# Patient Record
Sex: Female | Born: 1965 | Race: White | Hispanic: No | Marital: Married | Smoking: Never smoker
Health system: Southern US, Community
[De-identification: ages and names within clinical notes are randomized; demographics above are authoritative.]

## PROBLEM LIST (undated history)

## (undated) DIAGNOSIS — F419 Anxiety disorder, unspecified: Secondary | ICD-10-CM

## (undated) DIAGNOSIS — G709 Myoneural disorder, unspecified: Secondary | ICD-10-CM

## (undated) DIAGNOSIS — C4491 Basal cell carcinoma of skin, unspecified: Secondary | ICD-10-CM

## (undated) DIAGNOSIS — E079 Disorder of thyroid, unspecified: Secondary | ICD-10-CM

## (undated) DIAGNOSIS — D649 Anemia, unspecified: Secondary | ICD-10-CM

## (undated) HISTORY — DX: Anxiety disorder, unspecified: F41.9

## (undated) HISTORY — DX: Anemia, unspecified: D64.9

## (undated) HISTORY — DX: Disorder of thyroid, unspecified: E07.9

## (undated) HISTORY — PX: TONSILLECTOMY AND ADENOIDECTOMY: SUR1326

## (undated) HISTORY — DX: Myoneural disorder, unspecified: G70.9

## (undated) HISTORY — PX: CHOLECYSTECTOMY: SHX55

## (undated) HISTORY — DX: Basal cell carcinoma of skin, unspecified: C44.91

---

## 1997-03-20 ENCOUNTER — Inpatient Hospital Stay (HOSPITAL_COMMUNITY): Admission: AD | Admit: 1997-03-20 | Discharge: 1997-03-22 | Payer: Self-pay | Admitting: *Deleted

## 1997-05-29 ENCOUNTER — Inpatient Hospital Stay (HOSPITAL_COMMUNITY): Admission: AD | Admit: 1997-05-29 | Discharge: 1997-05-29 | Payer: Self-pay | Admitting: Obstetrics and Gynecology

## 1997-06-05 ENCOUNTER — Inpatient Hospital Stay (HOSPITAL_COMMUNITY): Admission: AD | Admit: 1997-06-05 | Discharge: 1997-06-05 | Payer: Self-pay | Admitting: Obstetrics and Gynecology

## 1997-08-24 ENCOUNTER — Inpatient Hospital Stay (HOSPITAL_COMMUNITY): Admission: AD | Admit: 1997-08-24 | Discharge: 1997-08-24 | Payer: Self-pay | Admitting: Obstetrics and Gynecology

## 1997-08-24 ENCOUNTER — Ambulatory Visit (HOSPITAL_COMMUNITY): Admission: RE | Admit: 1997-08-24 | Discharge: 1997-08-24 | Payer: Self-pay | Admitting: Obstetrics and Gynecology

## 1997-08-30 ENCOUNTER — Inpatient Hospital Stay (HOSPITAL_COMMUNITY): Admission: AD | Admit: 1997-08-30 | Discharge: 1997-09-02 | Payer: Self-pay | Admitting: Obstetrics and Gynecology

## 1998-10-30 ENCOUNTER — Other Ambulatory Visit: Admission: RE | Admit: 1998-10-30 | Discharge: 1998-10-30 | Payer: Self-pay | Admitting: Obstetrics and Gynecology

## 2000-07-16 ENCOUNTER — Other Ambulatory Visit: Admission: RE | Admit: 2000-07-16 | Discharge: 2000-07-16 | Payer: Self-pay | Admitting: Obstetrics and Gynecology

## 2001-01-26 ENCOUNTER — Encounter: Admission: RE | Admit: 2001-01-26 | Discharge: 2001-01-26 | Payer: Self-pay | Admitting: Obstetrics and Gynecology

## 2001-01-26 ENCOUNTER — Encounter: Payer: Self-pay | Admitting: Obstetrics and Gynecology

## 2001-07-22 ENCOUNTER — Other Ambulatory Visit: Admission: RE | Admit: 2001-07-22 | Discharge: 2001-07-22 | Payer: Self-pay | Admitting: Obstetrics and Gynecology

## 2001-12-04 ENCOUNTER — Encounter: Payer: Self-pay | Admitting: Neurology

## 2001-12-04 ENCOUNTER — Ambulatory Visit (HOSPITAL_COMMUNITY): Admission: RE | Admit: 2001-12-04 | Discharge: 2001-12-04 | Payer: Self-pay | Admitting: Neurology

## 2002-05-25 ENCOUNTER — Inpatient Hospital Stay (HOSPITAL_COMMUNITY): Admission: EM | Admit: 2002-05-25 | Discharge: 2002-05-28 | Payer: Self-pay | Admitting: Emergency Medicine

## 2002-05-25 ENCOUNTER — Encounter: Payer: Self-pay | Admitting: Internal Medicine

## 2002-08-02 ENCOUNTER — Encounter: Admission: RE | Admit: 2002-08-02 | Discharge: 2002-08-02 | Payer: Self-pay

## 2003-04-23 ENCOUNTER — Other Ambulatory Visit: Admission: RE | Admit: 2003-04-23 | Discharge: 2003-04-23 | Payer: Self-pay | Admitting: Obstetrics and Gynecology

## 2003-07-17 ENCOUNTER — Encounter (HOSPITAL_COMMUNITY): Admission: RE | Admit: 2003-07-17 | Discharge: 2003-10-15 | Payer: Self-pay

## 2004-03-14 ENCOUNTER — Ambulatory Visit (HOSPITAL_COMMUNITY): Admission: RE | Admit: 2004-03-14 | Discharge: 2004-03-14 | Payer: Self-pay | Admitting: Neurology

## 2004-12-11 ENCOUNTER — Ambulatory Visit (HOSPITAL_COMMUNITY): Admission: RE | Admit: 2004-12-11 | Discharge: 2004-12-11 | Payer: Self-pay | Admitting: General Surgery

## 2004-12-11 ENCOUNTER — Ambulatory Visit (HOSPITAL_BASED_OUTPATIENT_CLINIC_OR_DEPARTMENT_OTHER): Admission: RE | Admit: 2004-12-11 | Discharge: 2004-12-11 | Payer: Self-pay | Admitting: General Surgery

## 2004-12-11 ENCOUNTER — Encounter (INDEPENDENT_AMBULATORY_CARE_PROVIDER_SITE_OTHER): Payer: Self-pay | Admitting: Specialist

## 2005-01-19 ENCOUNTER — Encounter: Admission: RE | Admit: 2005-01-19 | Discharge: 2005-01-19 | Payer: Self-pay | Admitting: Unknown Physician Specialty

## 2005-06-08 ENCOUNTER — Other Ambulatory Visit: Admission: RE | Admit: 2005-06-08 | Discharge: 2005-06-08 | Payer: Self-pay | Admitting: Obstetrics and Gynecology

## 2005-10-14 ENCOUNTER — Ambulatory Visit (HOSPITAL_COMMUNITY): Admission: RE | Admit: 2005-10-14 | Discharge: 2005-10-14 | Payer: Self-pay

## 2008-07-02 ENCOUNTER — Encounter: Admission: RE | Admit: 2008-07-02 | Discharge: 2008-07-02 | Payer: Self-pay | Admitting: Obstetrics and Gynecology

## 2009-11-14 ENCOUNTER — Encounter: Admission: RE | Admit: 2009-11-14 | Discharge: 2009-11-14 | Payer: Self-pay | Admitting: Neurology

## 2010-03-10 ENCOUNTER — Encounter: Payer: Self-pay | Admitting: Obstetrics and Gynecology

## 2010-04-25 ENCOUNTER — Telehealth (INDEPENDENT_AMBULATORY_CARE_PROVIDER_SITE_OTHER): Payer: Self-pay | Admitting: *Deleted

## 2010-04-29 NOTE — Progress Notes (Signed)
Summary: DR Posey Rea OK'D THIS PT AS A NEW PT W/HIM  ---- Converted from flag ---- ---- 04/24/2010 10:00 PM, Tresa Garter MD wrote: ok Thank you!   ---- 04/24/2010 12:17 PM, Ivar Bury wrote: Dr Marny Lowenstein says you ok'd her as a new pt w/you.---Thank you for your reply. ------------------------------ Gave pt appt-phone:  05/21/10 @ 2P---

## 2010-05-20 ENCOUNTER — Telehealth: Payer: Self-pay | Admitting: Internal Medicine

## 2010-05-20 NOTE — Telephone Encounter (Signed)
Forwarded to Dr. Plotnikov for review. °

## 2010-05-21 ENCOUNTER — Encounter: Payer: Self-pay | Admitting: Internal Medicine

## 2010-05-21 ENCOUNTER — Ambulatory Visit (INDEPENDENT_AMBULATORY_CARE_PROVIDER_SITE_OTHER): Payer: BC Managed Care – PPO | Admitting: Internal Medicine

## 2010-05-21 DIAGNOSIS — C4491 Basal cell carcinoma of skin, unspecified: Secondary | ICD-10-CM

## 2010-05-21 DIAGNOSIS — R251 Tremor, unspecified: Secondary | ICD-10-CM | POA: Insufficient documentation

## 2010-05-21 DIAGNOSIS — R5383 Other fatigue: Secondary | ICD-10-CM

## 2010-05-21 DIAGNOSIS — R259 Unspecified abnormal involuntary movements: Secondary | ICD-10-CM

## 2010-05-21 DIAGNOSIS — R7689 Other specified abnormal immunological findings in serum: Secondary | ICD-10-CM

## 2010-05-21 DIAGNOSIS — G2582 Stiff-man syndrome: Secondary | ICD-10-CM

## 2010-05-21 DIAGNOSIS — R768 Other specified abnormal immunological findings in serum: Secondary | ICD-10-CM | POA: Insufficient documentation

## 2010-05-21 DIAGNOSIS — R894 Abnormal immunological findings in specimens from other organs, systems and tissues: Secondary | ICD-10-CM

## 2010-05-21 DIAGNOSIS — M2560 Stiffness of unspecified joint, not elsewhere classified: Secondary | ICD-10-CM

## 2010-05-21 DIAGNOSIS — R609 Edema, unspecified: Secondary | ICD-10-CM

## 2010-05-21 DIAGNOSIS — R5381 Other malaise: Secondary | ICD-10-CM

## 2010-05-21 DIAGNOSIS — E063 Autoimmune thyroiditis: Secondary | ICD-10-CM

## 2010-05-21 NOTE — Progress Notes (Signed)
Subjective:    Patient ID: Lisa Bruce, female    DOB: Jun 04, 1965, 45 y.o.   MRN: [id number]  HPI  C/o sickness started in 1 -2 days 7 years ago in 2004 when her legs locked up while walking. It was in the beginning of Summer 2004. She had a difficultt pregnancy and a cholecystectomy that year. The symptoms have escalated over a few months. Eventually she went to see her FP and then Dr Sandria Manly and Dr Phylliss Bob. 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 She had a muscle bx at [facility]. She had an EMG, LP, MRI brain/spine. She did much better on Imuran 2008- 2011, then it was stopped by Dr Sandria Manly due to malignancy potential concern of his  Review of Systems  Constitutional: Positive for activity change and fatigue. Negative for fever, chills, diaphoresis, appetite change and unexpected weight change.  HENT: Positive for facial swelling, neck pain, neck stiffness and voice change. Negative for hearing loss, ear pain, nosebleeds, congestion, sore throat, rhinorrhea, sneezing, drooling, mouth sores, trouble swallowing, dental problem, postnasal drip, sinus pressure, tinnitus and ear discharge.   Eyes: Positive for photophobia. Negative for pain, discharge, redness, itching and visual disturbance.  Respiratory: Negative for apnea, cough, choking, chest tightness, shortness of breath, wheezing and stridor.   Cardiovascular: Negative for chest pain, palpitations and leg swelling.  Gastrointestinal: Negative for nausea, vomiting, abdominal pain, diarrhea, constipation, blood in stool, abdominal distention, anal bleeding and rectal pain.  Genitourinary: Positive for flank pain. Negative for dysuria, urgency, frequency, hematuria, decreased urine volume, vaginal discharge, difficulty urinating, genital sores, vaginal pain, menstrual problem and pelvic pain.  Musculoskeletal: Positive for myalgias, back pain, arthralgias and gait problem. Negative for joint swelling.    Skin: Negative for color change, pallor, rash and wound.  Neurological: Positive for tremors, facial asymmetry, speech difficulty, weakness, light-headedness and numbness. Negative for dizziness, seizures, syncope and headaches.  Hematological: Negative for adenopathy. Does not bruise/bleed easily.  Psychiatric/Behavioral: Positive for behavioral problems and dysphoric mood. Negative for suicidal ideas, hallucinations, confusion, sleep disturbance, self-injury, decreased concentration and agitation. The patient is nervous/anxious and is hyperactive.        Objective:   Physical Exam  Constitutional: She is oriented to person, place, and time. She appears well-developed and well-nourished. No distress.  HENT:  Head: Normocephalic.  Right Ear: External ear normal.  Left Ear: External ear normal.  Nose: Nose normal.  Mouth/Throat: Oropharynx is clear and moist. No oropharyngeal exudate.  Eyes: Pupils are equal, round, and reactive to light. Right eye exhibits no discharge. Left eye exhibits no discharge. No scleral icterus.  Neck: No JVD present. No tracheal deviation present. No thyromegaly present.  Cardiovascular: Normal rate and normal heart sounds.  Exam reveals no gallop.   No murmur heard. Pulmonary/Chest: No respiratory distress. She has no wheezes. She has no rales. She exhibits tenderness.  Abdominal: She exhibits no distension and no mass. There is tenderness. There is guarding. There is no rebound.  Musculoskeletal: She exhibits tenderness. She exhibits no edema.       Right shoulder: She exhibits decreased range of motion, tenderness, pain and spasm. She exhibits no effusion and no deformity.  Lymphadenopathy:    She has no cervical adenopathy.  Neurological: She is alert and oriented to person, place, and time. She is not disoriented. She displays tremor and abnormal reflex (decreased). She displays no atrophy. A cranial nerve deficit is present. She exhibits abnormal  muscle  tone (very increased). She displays no seizure activity. Coordination and gait abnormal.       Spastic all over Mild facial droop on R  Skin: No rash noted. She is not diaphoretic (Cold skin). No cyanosis or erythema. No pallor.   L face with less amotion       Assessment & Plan:  ANA positive Unknown significance  Basal cell carcinoma of skin Recurrent. S/p multiple removals  Edema Not much swelling at present  Hashimoto thyroiditis On meds. Check labs  Tremor, involuntary spasm, or fasciculation Secondary to stiff person syndr. 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 I would restart Imuran if labs are OK - the pt is very willing. It is a very complex case.

## 2010-05-21 NOTE — Patient Instructions (Signed)
Call me with the Imuran dose Come to Lab this or next week in am

## 2010-05-22 ENCOUNTER — Other Ambulatory Visit (INDEPENDENT_AMBULATORY_CARE_PROVIDER_SITE_OTHER): Payer: BC Managed Care – PPO

## 2010-05-22 ENCOUNTER — Telehealth: Payer: Self-pay | Admitting: Internal Medicine

## 2010-05-22 ENCOUNTER — Other Ambulatory Visit: Payer: Self-pay | Admitting: Internal Medicine

## 2010-05-22 ENCOUNTER — Other Ambulatory Visit (INDEPENDENT_AMBULATORY_CARE_PROVIDER_SITE_OTHER): Payer: BC Managed Care – PPO | Admitting: Internal Medicine

## 2010-05-22 DIAGNOSIS — R5381 Other malaise: Secondary | ICD-10-CM

## 2010-05-22 DIAGNOSIS — R5383 Other fatigue: Secondary | ICD-10-CM

## 2010-05-22 DIAGNOSIS — G2582 Stiff-man syndrome: Secondary | ICD-10-CM

## 2010-05-22 DIAGNOSIS — R768 Other specified abnormal immunological findings in serum: Secondary | ICD-10-CM

## 2010-05-22 DIAGNOSIS — E063 Autoimmune thyroiditis: Secondary | ICD-10-CM

## 2010-05-22 DIAGNOSIS — E785 Hyperlipidemia, unspecified: Secondary | ICD-10-CM

## 2010-05-22 LAB — CBC WITH DIFFERENTIAL/PLATELET
Basophils Relative: 0.8 % (ref 0.0–3.0)
HCT: 38.8 % (ref 36.0–46.0)
Hemoglobin: 13.5 g/dL (ref 12.0–15.0)
Lymphocytes Relative: 36.8 % (ref 12.0–46.0)
MCHC: 34.8 g/dL (ref 30.0–36.0)
MCV: 92.1 fl (ref 78.0–100.0)
Neutro Abs: 2.3 10*3/uL (ref 1.4–7.7)
Platelets: 274 10*3/uL (ref 150.0–400.0)
WBC: 4.4 10*3/uL — ABNORMAL LOW (ref 4.5–10.5)

## 2010-05-22 LAB — CK: Total CK: 85 U/L (ref 7–177)

## 2010-05-22 LAB — T4, FREE: Free T4: 0.59 ng/dL — ABNORMAL LOW (ref 0.60–1.60)

## 2010-05-22 LAB — LIPID PANEL
Cholesterol: 269 mg/dL — ABNORMAL HIGH (ref 0–200)
HDL: 63.7 mg/dL (ref >39.00)
Triglycerides: 129 mg/dL (ref 0.0–149.0)
VLDL: 25.8 mg/dL (ref 0.0–40.0)

## 2010-05-22 LAB — URIC ACID: Uric Acid, Serum: 5.6 mg/dL (ref 2.4–7.0)

## 2010-05-22 LAB — VITAMIN B12: Vitamin B-12: 246 pg/mL (ref 211–911)

## 2010-05-22 LAB — TSH: TSH: 0.02 u[IU]/mL — ABNORMAL LOW (ref 0.35–5.50)

## 2010-05-22 LAB — COMPREHENSIVE METABOLIC PANEL
Albumin: 4.1 g/dL (ref 3.5–5.2)
BUN: 11 mg/dL (ref 6–23)
Glucose, Bld: 78 mg/dL (ref 70–99)
Potassium: 4.5 mEq/L (ref 3.5–5.1)

## 2010-05-22 LAB — HEPATITIS B SURFACE ANTIBODY,QUALITATIVE: Hep B S Ab: NEGATIVE

## 2010-05-22 LAB — SEDIMENTATION RATE: Sed Rate: 13 mm/hr (ref 0–22)

## 2010-05-22 LAB — IBC PANEL: Saturation Ratios: 27.3 % (ref 20.0–50.0)

## 2010-05-22 NOTE — Telephone Encounter (Signed)
Noted. Some labs will take a while before they come back.  Agree w/handicapped placard Thank you!

## 2010-05-22 NOTE — Telephone Encounter (Signed)
Pt came in to relay information requested yesterday in reference to : Azathioprine [Imuran] 50mg  TAB ROX Sig: Take 2 Tablets PO QAM & 1 Tablet QHs. Pt had labs requested done and would like to have Results relayed via telephone to either Pt Mobile @ [phone] OR Pt's Husband Mobile @[phone]. Please leave message on mobile voicemail if necessary as Pt will be out of town next week. Pt received Handicap placard requested thru [employer] while at office today. Thanks.

## 2010-05-23 LAB — GLIADIN ANTIBODIES, SERUM: Gliadin IgA: 5.5 U/mL (ref <20)

## 2010-05-23 LAB — TISSUE TRANSGLUTAMINASE, IGA: Tissue Transglutaminase Ab, IgA: 6.2 U/mL (ref <20)

## 2010-05-23 LAB — VITAMIN D 25 HYDROXY (VIT D DEFICIENCY, FRACTURES): Vit D, 25-Hydroxy: 31 ng/mL (ref 30–89)

## 2010-05-23 LAB — ANA: Anti Nuclear Antibody(ANA): NEGATIVE

## 2010-05-23 LAB — IGG, IGA, IGM
IgG (Immunoglobin G), Serum: 1450 mg/dL (ref 694–1618)
IgM, Serum: 152 mg/dL (ref 60–263)

## 2010-05-25 ENCOUNTER — Telehealth: Payer: Self-pay | Admitting: Internal Medicine

## 2010-05-25 MED ORDER — VITAMIN B-12 1000 MCG PO TABS: 1000.0000 ug | ORAL_TABLET | Freq: Every day | ORAL | Status: DC

## 2010-05-25 MED ORDER — VITAMIN D3 1.25 MG (50000 UT) PO CAPS: 1.0000 | ORAL_CAPSULE | ORAL | Status: DC

## 2010-05-25 NOTE — Telephone Encounter (Signed)
Lisa Bruce , please, inform the patient:  the labs show low vit B12 and vit D. Start Rx Vit D and Vit B12.  Please, keep  next office visit appointment.   Thank you !

## 2010-05-26 LAB — RETICULIN ANTIBODIES, IGA W TITER: Reticulin Ab, IgA: NEGATIVE

## 2010-05-27 DIAGNOSIS — C4491 Basal cell carcinoma of skin, unspecified: Secondary | ICD-10-CM | POA: Insufficient documentation

## 2010-05-27 NOTE — Telephone Encounter (Signed)
Left mess for patient to call back.  

## 2010-05-27 NOTE — Assessment & Plan Note (Signed)
Multifactorial 

## 2010-05-27 NOTE — Assessment & Plan Note (Signed)
On meds. Check labs

## 2010-05-27 NOTE — Assessment & Plan Note (Signed)
Recurrent. S/p multiple removals

## 2010-05-27 NOTE — Assessment & Plan Note (Signed)
Multiple documents since 2004 were reviewed. I would restart Imuran if labs are OK - the pt is very willing. It is a very complex case.

## 2010-05-27 NOTE — Assessment & Plan Note (Signed)
Not much swelling at present

## 2010-05-27 NOTE — Assessment & Plan Note (Signed)
Unknown significance

## 2010-05-27 NOTE — Assessment & Plan Note (Signed)
Secondary to stiff person syndr. Cont meds

## 2010-05-29 ENCOUNTER — Encounter: Payer: Self-pay | Admitting: Internal Medicine

## 2010-05-29 NOTE — Telephone Encounter (Signed)
Left mess to call office back.   

## 2010-06-03 ENCOUNTER — Telehealth: Payer: Self-pay | Admitting: Internal Medicine

## 2010-06-03 NOTE — Telephone Encounter (Signed)
Forwarded to Dr. Plotnikov for review. °

## 2010-06-04 ENCOUNTER — Encounter: Payer: Self-pay | Admitting: Internal Medicine

## 2010-06-04 ENCOUNTER — Ambulatory Visit (INDEPENDENT_AMBULATORY_CARE_PROVIDER_SITE_OTHER): Payer: BC Managed Care – PPO | Admitting: Internal Medicine

## 2010-06-04 DIAGNOSIS — E063 Autoimmune thyroiditis: Secondary | ICD-10-CM

## 2010-06-04 DIAGNOSIS — E538 Deficiency of other specified B group vitamins: Secondary | ICD-10-CM

## 2010-06-04 DIAGNOSIS — R5383 Other fatigue: Secondary | ICD-10-CM

## 2010-06-04 DIAGNOSIS — R259 Unspecified abnormal involuntary movements: Secondary | ICD-10-CM

## 2010-06-04 DIAGNOSIS — E559 Vitamin D deficiency, unspecified: Secondary | ICD-10-CM

## 2010-06-04 DIAGNOSIS — G2582 Stiff-man syndrome: Secondary | ICD-10-CM

## 2010-06-04 DIAGNOSIS — R5381 Other malaise: Secondary | ICD-10-CM

## 2010-06-04 MED ORDER — CYANOCOBALAMIN 1000 MCG/ML IJ SOLN
1000.0000 ug | Freq: Once | INTRAMUSCULAR | Status: AC
  Administered 2010-06-04: 1000 ug via INTRAMUSCULAR

## 2010-06-04 MED ORDER — MEPERIDINE HCL 50 MG PO TABS: 100.0000 mg | ORAL_TABLET | Freq: Every day | ORAL | Status: DC

## 2010-06-04 NOTE — Progress Notes (Signed)
  Subjective:    Patient ID: Lisa Bruce, female    DOB: [birth year], 45 y.o.   MRN: [id number]  HPI   The patient is here to follow up on labs with vit D and B12 deficiency, chronic stiffness and worsening myalgia symptoms not controlled with other  Medicines, except for Demerol. Ho relief with diet and exercise.   Review of Systems  Constitutional: Negative for chills.  HENT: Negative for mouth sores, trouble swallowing and voice change.   Eyes: Negative for pain.  Respiratory: Negative for wheezing.   Gastrointestinal: Positive for constipation.  Genitourinary: Negative for menstrual problem and pelvic pain.  Musculoskeletal: Positive for myalgias, arthralgias and gait problem.  Neurological: Positive for speech difficulty and weakness. Negative for tremors.  Hematological: Negative for adenopathy.  Psychiatric/Behavioral: Negative for suicidal ideas, hallucinations, behavioral problems and decreased concentration. The patient is nervous/anxious. The patient is not hyperactive.        Objective:   Physical Exam  Constitutional: She is oriented to person, place, and time. She appears well-developed and well-nourished. No distress.  HENT:  Head: Normocephalic.  Right Ear: External ear normal.  Left Ear: External ear normal.  Nose: Nose normal.  Mouth/Throat: Oropharynx is clear and moist.  Eyes: Conjunctivae are normal. Pupils are equal, round, and reactive to light. Right eye exhibits no discharge. Left eye exhibits no discharge.  Neck: Normal range of motion. Neck supple. No JVD present. No tracheal deviation present. No thyromegaly present.  Cardiovascular: Normal rate, regular rhythm and normal heart sounds.   Pulmonary/Chest: No stridor. No respiratory distress. She has no wheezes.  Abdominal: Soft. Bowel sounds are normal. She exhibits no distension and no mass. There is no tenderness. There is no rebound and no guarding.  Musculoskeletal: She exhibits tenderness. She  exhibits no edema.       contraced flexors UE>LE  Lymphadenopathy:    She has no cervical adenopathy.  Neurological: She is alert and oriented to person, place, and time. She displays abnormal reflex. No cranial nerve deficit. She exhibits abnormal muscle tone. Coordination abnormal.  Skin: No rash noted. No erythema.  Psychiatric: She has a normal mood and affect. Her behavior is normal. Judgment and thought content normal.          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 We discussed the case with Dr Sandria Manly. We can try Imuran at low dose with monthly labs.  Potential benefits of a long term Imuran  use as well as potential risks, including the risk of cancer (ie lymphoma) and complications were explained to the patient and were aknowledged. She would like to proceed. See meds. I suggested that she applies for disability    Hashimoto thyroiditis On Rx  Vitamin D deficiency Start on Rx  Vitamin B12 deficiency Start on Rx  Tremor, involuntary spasm, or fasciculation On symptomatic Rx

## 2010-06-06 ENCOUNTER — Telehealth: Payer: Self-pay | Admitting: *Deleted

## 2010-06-06 NOTE — Telephone Encounter (Signed)
Pt and pharm left vm - Pt thinks MD was going to send in Vit B12 and Vit D rx. Please advise.

## 2010-06-07 NOTE — Telephone Encounter (Signed)
They were sent to pharmacy - pls, see meds

## 2010-06-08 NOTE — Assessment & Plan Note (Signed)
Start on Rx 

## 2010-06-08 NOTE — Assessment & Plan Note (Signed)
On Rx 

## 2010-06-08 NOTE — Assessment & Plan Note (Addendum)
A very complex situation. We discussed the case with Dr Sandria Manly. We can try Imuran at low dose with monthly labs.  Potential benefits of a long term Imuran  use as well as potential risks, including the risk of cancer (ie lymphoma) and complications were explained to the patient and were aknowledged. She would like to proceed. See meds.

## 2010-06-08 NOTE — Assessment & Plan Note (Signed)
On symptomatic Rx

## 2010-06-09 ENCOUNTER — Telehealth: Payer: Self-pay | Admitting: *Deleted

## 2010-06-09 ENCOUNTER — Other Ambulatory Visit: Payer: Self-pay | Admitting: *Deleted

## 2010-06-09 DIAGNOSIS — G2582 Stiff-man syndrome: Secondary | ICD-10-CM

## 2010-06-09 MED ORDER — AZATHIOPRINE 50 MG PO TABS: 50.0000 mg | ORAL_TABLET | Freq: Every day | ORAL | Status: DC

## 2010-06-09 MED ORDER — VITAMIN D3 1.25 MG (50000 UT) PO CAPS: 1.0000 | ORAL_CAPSULE | ORAL | Status: DC

## 2010-06-09 MED ORDER — VITAMIN B-12 1000 MCG PO TABS: 1000.0000 ug | ORAL_TABLET | Freq: Every day | ORAL | Status: DC

## 2010-06-09 NOTE — Telephone Encounter (Signed)
Pt called stating you are suppose to speak to Dr. Sandria Manly about starting her back on Imuran. Is she suppose to start taking this yet? Please advise

## 2010-06-09 NOTE — Telephone Encounter (Signed)
Called pt. Ok to start Imuran 50 mg 1 a day. RTC 1 mo w/CMET and CBC 995.20

## 2010-06-15 MED ORDER — AZATHIOPRINE 50 MG PO TABS: 50.0000 mg | ORAL_TABLET | Freq: Every day | ORAL | Status: DC

## 2010-07-04 NOTE — Op Note (Signed)
NAMESYEDA, PRICKETT NO.:  [id number]   MEDICAL RECORD NO.:  [id number]          PATIENT TYPE:  OIB   LOCATION:  2858                         FACILITY:  [facility]   PHYSICIAN:  Genene Churn. Love, M.D.    DATE OF BIRTH:  Apr 10, 1965   DATE OF PROCEDURE:  03/14/2004  DATE OF DISCHARGE:                                 OPERATIVE REPORT   PROCEDURE NOTE:  The patient was prepped and draped in the left lateral  decubitus position.  Using Betadine and 1% Xylocaine, the L3-4 interspace  was entered without difficulty.  Opening pressure was 140 mmH2O.  Clear,  colorless CSF was obtained and sent for protein, glucose, VDRL, cell count  and differential, angiotensin-converting enzyme, IgG, oligoclonal IgG, anti-  DNA and antineuronal antibody.  The patient tolerated the procedure well.      JML/MEDQ  D:  03/14/2004  T:  03/14/2004  Job:  [id number]   cc:   Areatha Keas, M.D.  [address]  [address]  [place]  [place] [address]  Fax: [phone]

## 2010-07-04 NOTE — Op Note (Signed)
Lisa Bruce, Lisa Bruce              ACCOUNT NO.:  [id number]   MEDICAL RECORD NO.:  [id number]          PATIENT TYPE:  AMB   LOCATION:  DSC                          FACILITY:  [facility]   PHYSICIAN:  Angelia Mould. Derrell Lolling, M.D.DATE OF BIRTH:  Mar 07, 1965   DATE OF PROCEDURE:  12/11/2004  DATE OF DISCHARGE:                                 OPERATIVE REPORT   PREOPERATIVE DIAGNOSIS:  Myositis.   POSTOPERATIVE DIAGNOSIS:  Myositis.   OPERATION PERFORMED:  Left thigh and quadriceps muscle biopsy x3.   SURGEON:  Angelia Mould. Derrell Lolling, M.D.   OPERATIVE INDICATIONS:  A 45 year old white female who has had progressive  problems with muscle weakness of uncertain etiology. She has had extensive  workup here and at [facility]. Her rheumatologist, Dr. Chase Picket,  feels that a muscle biopsy is the next step in her diagnostic evaluation.  She has been counseled regarding this as an outpatient.   OPERATIVE TECHNIQUE:  The patient was placed supine on the operating table,  and it appeared that she was monitored and sedated by the anesthesia  department. The left thigh was prepped and draped in a sterile fashion.  Xylocaine 1% with epinephrine was used as a local infiltration anesthetic. A  linear incision was made in the proximal left thigh, slightly laterally and  slightly obliquely in the direction of the muscle fibers. Dissection was  carried down to the deep muscle fascia, which was incised in the direction  of its fibers. Self-retaining retractors were placed. I dissected out three  distinct bundles of left thigh quadriceps muscle. Each bundle was about 0.5  cm in diameter and about 2.5 cm in length. These were clamped proximally and  distally and amputated, and then affixed to a tongue blade with metal  staples, wrapped in moistened saline and sent to pathology. I did this 3  separate times. The cut ends of the muscle bundles were ligated with 3-0  Vicryl ties. The wound was irrigated with saline.  Hemostasis was excellent.  Subcutaneous tissue was closed with interrupted sutures of 3-0 Vicryl and  the skin closed with running subcuticular suture of 3-0 Monocryl and Steri-  Strips. Clean bandages were placed. The patient was taken to the recovery  room in stable condition. Estimated blood loss was about 10 cc.  Complications were none.  Sponge and instrument counts were correct.      Angelia Mould. Derrell Lolling, M.D.  Electronically Signed     HMI/MEDQ  D:  12/11/2004  T:  12/11/2004  Job:  [id number]   cc:   Areatha Keas, M.D.  Fax: [phone]

## 2010-07-04 NOTE — H&P (Signed)
Lisa Bruce, Lisa Bruce                        ACCOUNT NO.:  [id number]   MEDICAL RECORD NO.:  [id number]                   PATIENT TYPE:  INP   LOCATION:  0103                                 FACILITY:  [facility]   PHYSICIAN:  Sherin Quarry, MD                   DATE OF BIRTH:  1965-08-30   DATE OF ADMISSION:  05/25/2002  DATE OF DISCHARGE:                                HISTORY & PHYSICAL   HISTORY OF PRESENT ILLNESS:  The patient is a 45 year old lady who states  that she has been sick since 04/28/02.  At that time, she developed a fever,  persistent chest aching, and a pleuritic component to the chest discomfort.  This lasted about five days.  She said that she remained in bed during this  time.  On 05/01/02, she presented to Dr. Abigail Miyamoto who advised her that most  likely this represented a viral illness.  He obtained a chest x-ray, and  after reviewing the chest x-ray decided to start her on Tequin which she  took in a dose of 400 mg daily.  After four days of taking the Tequin she  experienced nausea and anorexia, and decided to stop this medication.  He  therefore switched her to [facility] which she took for a 10 day period.  Along  with this she took a cough medication.  On 05/18/02, she returned for checkup.  She really did not seem to be doing very well, she had a persistent cough,  she continued to experience malaise and anorexia.  She said there was some  aching in her chest when she coughed.  Another chest x-ray was obtained  which showed a persistent infiltrate.  She was given seven additional days  of Omnicef.  Over that period of time she has continued to experience a  persistent cough, aching discomfort in the left chest area, malaise,  anorexia, and fatigue.  She does not think that she has been running a  fever.  She has no history of smoking.  She is married and has two children  age 80 and 4 who have not been ill recently.  Her husband is also in good  health.  She has no  history of asthma.  She saw Dr. Abigail Miyamoto today who  because of her condition had not improved, recommended inpatient therapy.   ALLERGIES:  1. AMOXICILLIN.  2. CODEINE.  3. TEQUIN, nausea reaction.  4. She says that she believes that ERYTHROMYCIN caused puffiness in her     neck.   MEDICATIONS:  1. Synthroid 75 mcg daily.  2. Cytomel 25 mcg daily she believes.  3. Demerol 50 mg daily.   PAST MEDICAL HISTORY:  1. Hypothyroidism.  In the sixth grade the patient was diagnosed with     hypothyroidism, and was placed on thyroid replacement therapy.  Her dose     of medication has gradually been adjusted.  She is currently on Synthroid     75 mcg daily and Cytomel possibly 25 mcg daily.  She is not real sure     about the dose.  She says that her thyroid function was checked about a     month ago.  2. Undiagnosed rheumatologic problem.  The patient states that she has had a     persistent illness which has been characterized by proximal joint pain,     muscle aching, fatigue, generalized soreness, and malaise.  She has also     had episodic blurring of vision.  She has been very thoroughly evaluated     both by Dr. Phylliss Bob in [place] and by Dr. Colbert Coyer who is a rheumatologist     at [facility].  They have done extensive serologies, results of which     are pending.  The patient was placed on a six week course of prednisone     which I believe ended sometime in January.  Subsequently, she was advised     to take Plaquenil 200 mg b.i.d.  She took this for three weeks, but was     unable to tolerate it because she thought that it caused her vision to     blur.  For this reason, she discontinued this medication.  Currently, the     only medication she is taking for her arthritis is Demerol 50 mg at     bedtime daily.  3. Migraine headaches.  The patient has chronic migraine headaches for which     she takes Eseject Plus.  She will take several tablets of this medication     each week.    PAST SURGICAL HISTORY:  1. Gallbladder operation in 1999.  2. Previous tonsillectomy.   FAMILY HISTORY:  The patient's father died as a result of metastatic  melanoma.  Otherwise, everyone else in the family is in good health.   SOCIAL HISTORY:  She says she does not smoke.  She denies abuse of alcohol  or drugs.  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   REVIEW OF SYMPTOMS:  HEAD:  See above.  EYES:  See above.  EARS, NOSE, AND  THROAT:  Denies earaches, sinus pain, or sore throat.  CHEST:  See above.  CARDIOVASCULAR:  Denies orthopnea or PND.  GASTROINTESTINAL:  Denies nausea,  vomiting, abdominal pain, change in bowel habits, melena, or hematochezia.  GENITOURINARY:  Denies dysuria, urinary frequency, hesitancy, or nocturia.  GYNECOLOGIC:  She has had normal menstrual periods.  NEUROLOGIC:  Denies  seizure or stroke.  ENDOCRINE:  Denies excessive thirst, urinary frequency,  or nocturia.   PHYSICAL EXAMINATION:  HEENT:  Within normal limits.  CHEST:  Remarkable for  mild wheezing with coughing or forced expiration.  I really do not hear any  rhonchi or rales.  CARDIOVASCULAR:  Normal S1 and S2 without murmurs, rubs,  or gallops.  ABDOMEN:  Benign, normal bowel sounds without masses,  tenderness, or organomegaly.  NEUROLOGIC:  Normal.  EXTREMITIES:  Normal.   IMPRESSION:  1. I reviewed the patient's x-rays with the radiologist, and I think that     the infiltrate in her left lung is actually clearing a little bit.  It     would appear that she has a partially treated pneumonia.  In light of her     persistent chest pain, I think it would be reasonable to obtain a CT scan     of  the chest to rule out pulmonary embolus.  2. Hypothyroidism.  3. Undiagnosed joint disorder.  4. Migraine headaches.  5. History of multiple drug intolerances.   PLAN:  1. We will admit the patient for intravenous fluids. 2. We will start her on Zithromax 500 mg IV daily and  Rocephin 2 g IV daily.  3. We will monitor her oxygen saturations.  4. We will give her nebulizer treatments.  5.     I also plan to give her a short tapering course of prednisone medication.  6. As mentioned previously, we will obtain a CT scan of the chest and we     will continue her usual medications.  Hopefully, her condition will     respond to these treatments.                                               Sherin Quarry, MD    SY/MEDQ  D:  05/25/2002  T:  05/25/2002  Job:  [id number]   cc:   Chales Salmon. Abigail Miyamoto, M.D.  [address]  [place]  [place] [address]  Fax: [phone]   Areatha Keas, M.D.  [address]  [address]  [place]  [place] [address]  Fax: [phone]

## 2010-07-04 NOTE — Discharge Summary (Signed)
Lisa, Bruce                        ACCOUNT NO.:  [id number]   MEDICAL RECORD NO.:  [id number]                   PATIENT TYPE:  INP   LOCATION:  0346                                 FACILITY:  [facility]   PHYSICIAN:  Sherin Quarry, MD                   DATE OF BIRTH:  05-06-1965   DATE OF ADMISSION:  05/25/2002  DATE OF DISCHARGE:  05/28/2002                                 DISCHARGE SUMMARY   HISTORY OF PRESENT ILLNESS:  Lisa Bruce is a 45 year old lady who dates  the onset of the current illness to April 28, 2002. As is detailed in the  history and physical, she was treated extensively as an outpatient but her  condition failed to improve. At the time of admission on April 8, she  complained of persistent cough, aching discomfort in the left chest area,  malaise, anorexia and fatigue. She is married and has two children. Her  husband and children have not been sick. She had no history of asthma.  Because the patient had not responded adequately to outpatient therapy, a  decision was made to admit the patient to the [facility]. Of note  is that the patient reported allergies to AMOXICILLIN, CODEINE, TEQUIN and  ERYTHROMYCIN.   PHYSICAL EXAM ON ADMISSION:  GENERAL:  Revealed an anxious somewhat tearful  young woman who reported discomfort in the left chest on inspiration.  HEENT:  Within normal limits.  CHEST:  Revealed mild wheezing with coughing or forced expiration, no rales  or rhonchi were audible.  CARDIOVASCULAR:  Revealed normal S1 and S2 without murmurs, rubs or gallops.  ABDOMEN:  Benign, normal bowel sounds without masses, tenderness or  organomegaly.  EXTREMITIES/NEUROLOGIC:  Neurologic testing and examination of the  extremities was normal.   LABORATORY DATA:  Studies performed included a CBC which revealed a white  count of 4200, hemoglobin 11.4, hematocrit 33.6. There is a normal  differential. Sedimentation rate was 13. The initial CMET  revealed normal  liver functions. Initial potassium was 3.3, creatinine was 0.7, BUN was 5,  glucose was 83. ''   HOSPITAL COURSE:  On admission, I elected to place the patient on Zithromax  500 mg IV daily and Rocephin 2 g IV daily. Oxygen protocol was initiated to  keep O2 sat greater than 94% and the patient was given Nebulizer treatments  of albuterol and Atrovent. Because of some wheezing and because of the long  history of coughing, I thought that asthmatic bronchitis might be playing a  role in the patient's symptoms. I therefore gave her Solu-Medrol 80 mg IV x1  and placed her on prednisone 60 mg daily. Her Synthroid was continued. The  patient's hospital course was somewhat rocky. Her chest x-ray showed a mild  left sided infiltrated which appeared to have improved over previous films.  A CT scan of the chest was done which showed no  other significant findings.  There is no sign of pulmonary embolus. The patient's chest discomfort seemed  to slowly resolved. During the course of the hospitalization, she had  several headaches which she described as migraine headaches for which she  requested a narcotic medication. She was treated with Demerol for these  headaches. Also she had a couple of other dramatic episodes of panic and  anxiety associated with tearfulness, shaking and fearfulness. This seemed to  respond reasonably well to p.r.n. Ativan. Another curious finding during the  course of the hospitalization was that on the day after admission, a repeat  CMET showed the AST to have gone up to 782, ALT 487, alkaline phosphatase  131 with a normal bilirubin. The patient had no liver tenderness. The next  day, the AST was down to 298, ALT 394, alkaline phosphatase 130. The  explanation for these abnormalities is not apparent at this time. On April  11, the patient was feeling much better. She indicated that the chest  discomfort had resolved, that she was breathing reasonably well.  She  indicated that she was no longer feeling anxious and tense. She was afebrile  and her vital signs were stable, therefore, it was felt reasonable to  discharge the patient at that time.   DISCHARGE DIAGNOSES:  1. Atypical pneumonia.  2. Unexplained acute elevation of liver functions apparently resolving.  3. Chronic migraine headaches.  4. Atypical collagen vascular disease syndrome.  5. Hypothyroidism.   DISCHARGE MEDICATIONS:  The patient will continue her usual medicines which  consist of Synthroid 75 mcg daily and Cytomel which he takes in a dose that  she is not certain about. She also takes Esgic plus p.r.n. for headaches and  Demerol 50 mg p.r.n. for pain. She is also advised to take Zithromax 250 mg  x5 days, Ceftin 500 mg b.i.d. x5 days, and a tapering scheduled of  prednisone 40 mg x3 days, 30 mg x3 days, 20 x 3, 10 x 3 and then stop.   FOLLOW UP:  She is instructed to return to see Dr. Abigail Miyamoto or Dr. Theresia Lo  in the office in 7-10 days. It will be important to follow two parameters  when this is done. First, eventually she should have a followup chest x-ray  probably in 4-6 weeks. Secondly, a followup check of her liver profile is  warranted to make sure that this returns to normal as the explanation for  this marked rise in liver functions is unclear.   CONDITION ON DISCHARGE:  Good.                                               Sherin Quarry, MD    SY/MEDQ  D:  05/28/2002  T:  05/28/2002  Job:  [id number]   cc:   Chales Salmon. Abigail Miyamoto, M.D.  [address]  [place]  [place] [address]  Fax: [phone]   Areatha Keas, M.D.  [address]  [address]  [place]  [place] [address]  Fax: [phone]

## 2010-08-06 ENCOUNTER — Other Ambulatory Visit: Payer: BC Managed Care – PPO

## 2010-08-06 ENCOUNTER — Other Ambulatory Visit: Payer: Self-pay | Admitting: Internal Medicine

## 2010-08-06 ENCOUNTER — Ambulatory Visit: Payer: BC Managed Care – PPO | Admitting: Internal Medicine

## 2010-08-06 DIAGNOSIS — T887XXA Unspecified adverse effect of drug or medicament, initial encounter: Secondary | ICD-10-CM

## 2010-08-07 ENCOUNTER — Ambulatory Visit (INDEPENDENT_AMBULATORY_CARE_PROVIDER_SITE_OTHER): Payer: BC Managed Care – PPO | Admitting: Internal Medicine

## 2010-08-07 ENCOUNTER — Encounter: Payer: Self-pay | Admitting: Internal Medicine

## 2010-08-07 ENCOUNTER — Other Ambulatory Visit (INDEPENDENT_AMBULATORY_CARE_PROVIDER_SITE_OTHER): Payer: BC Managed Care – PPO

## 2010-08-07 DIAGNOSIS — R5383 Other fatigue: Secondary | ICD-10-CM

## 2010-08-07 DIAGNOSIS — R5381 Other malaise: Secondary | ICD-10-CM

## 2010-08-07 DIAGNOSIS — E559 Vitamin D deficiency, unspecified: Secondary | ICD-10-CM

## 2010-08-07 DIAGNOSIS — E039 Hypothyroidism, unspecified: Secondary | ICD-10-CM

## 2010-08-07 DIAGNOSIS — E538 Deficiency of other specified B group vitamins: Secondary | ICD-10-CM

## 2010-08-07 DIAGNOSIS — G2582 Stiff-man syndrome: Secondary | ICD-10-CM

## 2010-08-07 LAB — CBC WITH DIFFERENTIAL/PLATELET
Basophils Absolute: 0 10*3/uL (ref 0.0–0.1)
Hemoglobin: 12.3 g/dL (ref 12.0–15.0)
Lymphocytes Relative: 35.6 % (ref 12.0–46.0)
Monocytes Relative: 7.4 % (ref 3.0–12.0)
Neutro Abs: 2.4 10*3/uL (ref 1.4–7.7)
Platelets: 280 10*3/uL (ref 150.0–400.0)
RDW: 13.9 % (ref 11.5–14.6)

## 2010-08-07 LAB — TSH: TSH: 0.02 u[IU]/mL — ABNORMAL LOW (ref 0.35–5.50)

## 2010-08-07 MED ORDER — VITAMIN D 1000 UNITS PO TABS: 1000.0000 [IU] | ORAL_TABLET | Freq: Every day | ORAL | Status: DC

## 2010-08-07 MED ORDER — PREDNISOLONE 5 MG PO TABS: 5.0000 mg | ORAL_TABLET | Freq: Every day | ORAL | Status: DC

## 2010-08-07 NOTE — Patient Instructions (Addendum)
Stop Imuran Use Prednisone 5-10 mg a day w/food. You can use OTC Prilosec with it too. Try Metanx 1 twice a day BUPAP as needed for headaches

## 2010-08-07 NOTE — Assessment & Plan Note (Addendum)
On Rx 

## 2010-08-07 NOTE — Assessment & Plan Note (Signed)
Start PO

## 2010-08-07 NOTE — Assessment & Plan Note (Signed)
Discussed. D/c Imuran We can try Prednisone at low dose

## 2010-08-07 NOTE — Progress Notes (Signed)
  Subjective:    Patient ID: Lisa Bruce, female    DOB: September 09, 1965, 45 y.o.   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 C/o nausea, chills, and weakness after she started to take Imuran.  Review of Systems  Constitutional: Positive for fatigue. Negative for chills, activity change, appetite change and unexpected weight change.  HENT: Negative for congestion, mouth sores and sinus pressure.   Eyes: Negative for visual disturbance.  Respiratory: Negative for cough, chest tightness and shortness of breath.   Cardiovascular: Negative for leg swelling.  Gastrointestinal: Positive for nausea. Negative for abdominal pain.  Genitourinary: Negative for frequency, difficulty urinating and vaginal pain.  Musculoskeletal: Positive for myalgias, back pain, joint swelling, arthralgias and gait problem.  Skin: Negative for pallor and rash.  Neurological: Positive for dizziness, tremors, speech difficulty and weakness. Negative for numbness and headaches.  Psychiatric/Behavioral: Negative for suicidal ideas, confusion and sleep disturbance.       Objective:   Physical Exam  Constitutional: She appears well-developed and well-nourished. No distress.  HENT:  Head: Normocephalic.  Right Ear: External ear normal.  Left Ear: External ear normal.  Nose: Nose normal.  Mouth/Throat: Oropharynx is clear and moist.  Eyes: Conjunctivae are normal. Pupils are equal, round, and reactive to light. Right eye exhibits no discharge. Left eye exhibits no discharge.  Neck: Normal range of motion. Neck supple. No JVD present. No tracheal deviation present. No thyromegaly present.  Cardiovascular: Normal rate, regular rhythm and normal heart sounds.   Pulmonary/Chest: No stridor. No respiratory distress. She has no wheezes.  Abdominal: Soft.  Bowel sounds are normal. She exhibits no distension and no mass. There is no tenderness. There is no rebound and no guarding.  Musculoskeletal: She exhibits tenderness. She exhibits no edema.       stiff  Lymphadenopathy:    She has no cervical adenopathy.  Neurological: She displays abnormal reflex. No cranial nerve deficit. She exhibits abnormal muscle tone. Coordination normal.       Stiff Hand tremor  Skin: No rash noted. No erythema.  Psychiatric: Her behavior is normal. Judgment and thought content normal.       tearful        Lab Results  Component Value Date   WBC 4.3* 08/07/2010   HGB 12.3 08/07/2010   HCT 36.0 08/07/2010   PLT 280.0 08/07/2010   CHOL 269* 05/22/2010   TRIG 129.0 05/22/2010   HDL 63.70 05/22/2010   LDLDIRECT 180.1 05/22/2010   ALT 20 05/22/2010   AST 21 05/22/2010   NA 141 05/22/2010   K 4.5 05/22/2010   CL 107 05/22/2010   CREATININE 1.0 05/22/2010   BUN 11 05/22/2010   CO2 21 05/22/2010   TSH 0.02* 08/07/2010     Assessment & Plan:

## 2010-08-08 ENCOUNTER — Telehealth: Payer: Self-pay | Admitting: Internal Medicine

## 2010-08-08 DIAGNOSIS — E039 Hypothyroidism, unspecified: Secondary | ICD-10-CM | POA: Insufficient documentation

## 2010-08-08 LAB — VITAMIN D 25 HYDROXY (VIT D DEFICIENCY, FRACTURES): Vit D, 25-Hydroxy: 59 ng/mL (ref 30–89)

## 2010-08-08 MED ORDER — LEVOTHYROXINE SODIUM 50 MCG PO TABS: 50.0000 ug | ORAL_TABLET | Freq: Every day | ORAL | Status: DC

## 2010-08-08 NOTE — Telephone Encounter (Signed)
Pt informed

## 2010-08-08 NOTE — Telephone Encounter (Signed)
Left mess for patient to call back.  

## 2010-08-08 NOTE — Assessment & Plan Note (Signed)
Reduce Levothyroxine dose

## 2010-08-08 NOTE — Telephone Encounter (Signed)
Lisa Bruce, please, inform patient that all labs are normal except for abn thyroid test. Thyroid med dose is too high. Take Levothyroxin 50 mcg a day. Thx

## 2010-10-08 ENCOUNTER — Encounter: Payer: Self-pay | Admitting: Internal Medicine

## 2010-10-08 ENCOUNTER — Ambulatory Visit (INDEPENDENT_AMBULATORY_CARE_PROVIDER_SITE_OTHER): Payer: BC Managed Care – PPO | Admitting: Internal Medicine

## 2010-10-08 DIAGNOSIS — R5383 Other fatigue: Secondary | ICD-10-CM

## 2010-10-08 DIAGNOSIS — E063 Autoimmune thyroiditis: Secondary | ICD-10-CM

## 2010-10-08 DIAGNOSIS — M2560 Stiffness of unspecified joint, not elsewhere classified: Secondary | ICD-10-CM

## 2010-10-08 DIAGNOSIS — E538 Deficiency of other specified B group vitamins: Secondary | ICD-10-CM

## 2010-10-08 DIAGNOSIS — R5381 Other malaise: Secondary | ICD-10-CM

## 2010-10-08 DIAGNOSIS — R259 Unspecified abnormal involuntary movements: Secondary | ICD-10-CM

## 2010-10-08 DIAGNOSIS — G2582 Stiff-man syndrome: Secondary | ICD-10-CM

## 2010-10-08 DIAGNOSIS — E559 Vitamin D deficiency, unspecified: Secondary | ICD-10-CM

## 2010-10-08 NOTE — Assessment & Plan Note (Signed)
On Rx 
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
## 2010-10-08 NOTE — Assessment & Plan Note (Signed)
I was wondering if we could try anti- Parkinson's meds empirically

## 2010-10-08 NOTE — Assessment & Plan Note (Signed)
On RX 

## 2010-10-08 NOTE — Assessment & Plan Note (Signed)
Discussed.

## 2010-10-08 NOTE — Assessment & Plan Note (Signed)
Monitoring TSH 
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
## 2010-10-29 ENCOUNTER — Telehealth: Payer: Self-pay | Admitting: *Deleted

## 2010-10-29 NOTE — Telephone Encounter (Signed)
OK to stay on Prednisone - it is a low dose Thx

## 2010-10-29 NOTE — Telephone Encounter (Signed)
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 Dr Anne Hahn is concerned about her being on prednisone , she would like some advisement as to what she should do about the prednisone before her surg. Please advise

## 2010-10-30 NOTE — Telephone Encounter (Signed)
Left detailed vm on pt's cell

## 2011-01-14 ENCOUNTER — Encounter: Payer: Self-pay | Admitting: Internal Medicine

## 2011-01-14 ENCOUNTER — Ambulatory Visit (INDEPENDENT_AMBULATORY_CARE_PROVIDER_SITE_OTHER): Payer: 59 | Admitting: Internal Medicine

## 2011-01-14 VITALS — BP 92/70 | HR 80 | Temp 98.0°F | Resp 16 | Wt 164.0 lb

## 2011-01-14 DIAGNOSIS — R259 Unspecified abnormal involuntary movements: Secondary | ICD-10-CM

## 2011-01-14 DIAGNOSIS — E559 Vitamin D deficiency, unspecified: Secondary | ICD-10-CM

## 2011-01-14 DIAGNOSIS — C4491 Basal cell carcinoma of skin, unspecified: Secondary | ICD-10-CM

## 2011-01-14 DIAGNOSIS — R252 Cramp and spasm: Secondary | ICD-10-CM

## 2011-01-14 DIAGNOSIS — M2560 Stiffness of unspecified joint, not elsewhere classified: Secondary | ICD-10-CM

## 2011-01-14 DIAGNOSIS — R5383 Other fatigue: Secondary | ICD-10-CM

## 2011-01-14 DIAGNOSIS — E538 Deficiency of other specified B group vitamins: Secondary | ICD-10-CM

## 2011-01-14 DIAGNOSIS — G2582 Stiff-man syndrome: Secondary | ICD-10-CM

## 2011-01-14 DIAGNOSIS — R5381 Other malaise: Secondary | ICD-10-CM

## 2011-01-14 MED ORDER — COENZYME Q10 30 MG PO CAPS: 100.0000 mg | ORAL_CAPSULE | Freq: Three times a day (TID) | ORAL | Status: DC

## 2011-01-14 MED ORDER — METHYLPHENIDATE HCL 5 MG PO TABS: 5.0000 mg | ORAL_TABLET | Freq: Two times a day (BID) | ORAL | Status: DC

## 2011-01-14 NOTE — Patient Instructions (Signed)
Try Gluten free diet 

## 2011-01-14 NOTE — Progress Notes (Signed)
  Subjective:    Patient ID: Lisa Bruce, female    DOB: August 14, 1965, 45 y.o.   MRN: [id number]  HPI  The patient is here to follow up on chronic spastic disease,depression, anxiety, headaches and chronic moderate myalgia symptoms controlled partially with medicines, diet and exercise. C/o fatigue    Review of Systems  Constitutional: Negative for chills, activity change, appetite change, fatigue and unexpected weight change.  HENT: Negative for congestion, mouth sores and sinus pressure.   Eyes: Negative for visual disturbance.  Respiratory: Negative for cough and chest tightness.   Gastrointestinal: Negative for nausea and abdominal pain.  Genitourinary: Negative for frequency, difficulty urinating and vaginal pain.  Musculoskeletal: Positive for myalgias, back pain, arthralgias and gait problem. Negative for joint swelling.  Skin: Negative for pallor and rash.  Neurological: Positive for dizziness, tremors, weakness, light-headedness and numbness. Negative for headaches.  Psychiatric/Behavioral: Negative for suicidal ideas, confusion and sleep disturbance. The patient is nervous/anxious.        Objective:   Physical Exam  Constitutional: She appears well-developed and well-nourished. No distress.  HENT:  Head: Normocephalic.  Right Ear: External ear normal.  Left Ear: External ear normal.  Nose: Nose normal.  Mouth/Throat: Oropharynx is clear and moist.  Eyes: Conjunctivae are normal. Pupils are equal, round, and reactive to light. Right eye exhibits no discharge. Left eye exhibits no discharge.  Neck: Normal range of motion. Neck supple. No JVD present. No tracheal deviation present. No thyromegaly present.  Cardiovascular: Normal rate, regular rhythm and normal heart sounds.   Pulmonary/Chest: No stridor. No respiratory distress. She has no wheezes.  Abdominal: Soft. Bowel sounds are normal. She exhibits no distension and no mass. There is no tenderness. There is no rebound  and no guarding.  Musculoskeletal: She exhibits tenderness. She exhibits no edema.  Lymphadenopathy:    She has no cervical adenopathy.  Neurological: She displays normal reflexes. No cranial nerve deficit. She exhibits abnormal muscle tone. Coordination abnormal.       spastic  Skin: No rash noted. No erythema.  Psychiatric: Her behavior is normal. Judgment and thought content normal.       sad          Assessment & Plan:

## 2011-01-15 NOTE — Assessment & Plan Note (Signed)
Continue with current prescription therapy as reflected on the Med list.  

## 2011-01-15 NOTE — Assessment & Plan Note (Signed)
She stopped prednisone Try gluten free diet Try CoQ10 Take Diazepam and Demerol w/regular intervals

## 2011-01-15 NOTE — Assessment & Plan Note (Signed)
She had another procedure

## 2011-02-20 ENCOUNTER — Other Ambulatory Visit (INDEPENDENT_AMBULATORY_CARE_PROVIDER_SITE_OTHER): Payer: 59

## 2011-02-20 DIAGNOSIS — R259 Unspecified abnormal involuntary movements: Secondary | ICD-10-CM

## 2011-02-20 DIAGNOSIS — R5383 Other fatigue: Secondary | ICD-10-CM

## 2011-02-20 DIAGNOSIS — R252 Cramp and spasm: Secondary | ICD-10-CM

## 2011-02-20 DIAGNOSIS — R5381 Other malaise: Secondary | ICD-10-CM

## 2011-02-20 LAB — BASIC METABOLIC PANEL
BUN: 10 mg/dL (ref 6–23)
CO2: 26 mEq/L (ref 19–32)
Calcium: 8.9 mg/dL (ref 8.4–10.5)
Creatinine, Ser: 0.8 mg/dL (ref 0.4–1.2)
Glucose, Bld: 70 mg/dL (ref 70–99)
Sodium: 138 mEq/L (ref 135–145)

## 2011-02-20 LAB — CBC WITH DIFFERENTIAL/PLATELET
Basophils Absolute: 0 10*3/uL (ref 0.0–0.1)
Eosinophils Absolute: 0.1 10*3/uL (ref 0.0–0.7)
Hemoglobin: 12.4 g/dL (ref 12.0–15.0)
Lymphocytes Relative: 29.6 % (ref 12.0–46.0)
MCHC: 34.2 g/dL (ref 30.0–36.0)
Neutro Abs: 2.2 10*3/uL (ref 1.4–7.7)
Platelets: 294 10*3/uL (ref 150.0–400.0)
RDW: 14.1 % (ref 11.5–14.6)

## 2011-02-20 LAB — HEPATIC FUNCTION PANEL
ALT: 17 U/L (ref 0–35)
AST: 20 U/L (ref 0–37)
Alkaline Phosphatase: 82 U/L (ref 39–117)
Bilirubin, Direct: 0 mg/dL (ref 0.0–0.3)
Total Bilirubin: 0.4 mg/dL (ref 0.3–1.2)
Total Protein: 7.5 g/dL (ref 6.0–8.3)

## 2011-02-20 LAB — CK: Total CK: 47 U/L (ref 7–177)

## 2011-02-20 LAB — SEDIMENTATION RATE: Sed Rate: 19 mm/hr (ref 0–22)

## 2011-04-22 ENCOUNTER — Ambulatory Visit: Payer: 59 | Admitting: Internal Medicine

## 2011-05-07 ENCOUNTER — Emergency Department (HOSPITAL_COMMUNITY)
Admission: EM | Admit: 2011-05-07 | Discharge: 2011-05-07 | Disposition: A | Discharge status: Home / Self Care | Payer: 59 | Attending: Emergency Medicine | Admitting: Emergency Medicine

## 2011-05-07 ENCOUNTER — Emergency Department (HOSPITAL_COMMUNITY): Payer: 59

## 2011-05-07 ENCOUNTER — Encounter (HOSPITAL_COMMUNITY): Payer: Self-pay

## 2011-05-07 DIAGNOSIS — R209 Unspecified disturbances of skin sensation: Secondary | ICD-10-CM | POA: Insufficient documentation

## 2011-05-07 DIAGNOSIS — R5383 Other fatigue: Secondary | ICD-10-CM | POA: Insufficient documentation

## 2011-05-07 DIAGNOSIS — R5381 Other malaise: Secondary | ICD-10-CM | POA: Insufficient documentation

## 2011-05-07 DIAGNOSIS — R112 Nausea with vomiting, unspecified: Secondary | ICD-10-CM | POA: Insufficient documentation

## 2011-05-07 DIAGNOSIS — R000 Tachycardia, unspecified: Secondary | ICD-10-CM | POA: Insufficient documentation

## 2011-05-07 DIAGNOSIS — R51 Headache: Secondary | ICD-10-CM | POA: Insufficient documentation

## 2011-05-07 DIAGNOSIS — H02409 Unspecified ptosis of unspecified eyelid: Secondary | ICD-10-CM | POA: Insufficient documentation

## 2011-05-07 DIAGNOSIS — H8110 Benign paroxysmal vertigo, unspecified ear: Secondary | ICD-10-CM | POA: Insufficient documentation

## 2011-05-07 DIAGNOSIS — Z79899 Other long term (current) drug therapy: Secondary | ICD-10-CM | POA: Insufficient documentation

## 2011-05-07 LAB — URINALYSIS, ROUTINE W REFLEX MICROSCOPIC
Glucose, UA: NEGATIVE mg/dL
Hgb urine dipstick: NEGATIVE
Leukocytes, UA: NEGATIVE
Specific Gravity, Urine: 1.009 (ref 1.005–1.030)
pH: 6.5 (ref 5.0–8.0)

## 2011-05-07 LAB — COMPREHENSIVE METABOLIC PANEL
ALT: 12 U/L (ref 0–35)
AST: 17 U/L (ref 0–37)
Alkaline Phosphatase: 91 U/L (ref 39–117)
CO2: 21 mEq/L (ref 19–32)
Chloride: 104 mEq/L (ref 96–112)
GFR calc Af Amer: >90 mL/min (ref >90)
GFR calc non Af Amer: >90 mL/min (ref >90)
Glucose, Bld: 86 mg/dL (ref 70–99)
Potassium: 3.8 mEq/L (ref 3.5–5.1)
Sodium: 137 mEq/L (ref 135–145)

## 2011-05-07 LAB — DIFFERENTIAL
Eosinophils Absolute: 0.1 10*3/uL (ref 0.0–0.7)
Lymphocytes Relative: 30 % (ref 12–46)
Lymphs Abs: 1.9 10*3/uL (ref 0.7–4.0)
Monocytes Relative: 6 % (ref 3–12)
Neutrophils Relative %: 64 % (ref 43–77)

## 2011-05-07 LAB — CBC
Hemoglobin: 12 g/dL (ref 12.0–15.0)
MCH: 30.8 pg (ref 26.0–34.0)
RBC: 3.89 MIL/uL (ref 3.87–5.11)
WBC: 6.5 10*3/uL (ref 4.0–10.5)

## 2011-05-07 MED ORDER — ONDANSETRON HCL 4 MG/2ML IJ SOLN
4.0000 mg | Freq: Once | INTRAMUSCULAR | Status: AC
  Administered 2011-05-07: 4 mg via INTRAVENOUS
  Filled 2011-05-07: qty 2

## 2011-05-07 MED ORDER — DIAZEPAM 5 MG/ML IJ SOLN
5.0000 mg | Freq: Once | INTRAMUSCULAR | Status: AC
  Administered 2011-05-07: 5 mg via INTRAVENOUS
  Filled 2011-05-07: qty 2

## 2011-05-07 MED ORDER — ONDANSETRON 8 MG PO TBDP: 8.0000 mg | ORAL_TABLET | Freq: Three times a day (TID) | ORAL | Status: AC | PRN

## 2011-05-07 MED ORDER — METOCLOPRAMIDE HCL 5 MG/ML IJ SOLN
10.0000 mg | Freq: Once | INTRAMUSCULAR | Status: AC
  Administered 2011-05-07: 10 mg via INTRAVENOUS
  Filled 2011-05-07: qty 2

## 2011-05-07 MED ORDER — DIPHENHYDRAMINE HCL 50 MG/ML IJ SOLN
25.0000 mg | Freq: Once | INTRAMUSCULAR | Status: AC
  Administered 2011-05-07: 25 mg via INTRAVENOUS
  Filled 2011-05-07: qty 1

## 2011-05-07 MED ORDER — SODIUM CHLORIDE 0.9 % IV BOLUS (SEPSIS)
1000.0000 mL | Freq: Once | INTRAVENOUS | Status: AC
  Administered 2011-05-07: 1000 mL via INTRAVENOUS

## 2011-05-07 MED ORDER — MEPERIDINE HCL 50 MG PO TABS
50.0000 mg | ORAL_TABLET | Freq: Once | ORAL | Status: AC
  Administered 2011-05-07: 50 mg via ORAL
  Filled 2011-05-07: qty 1

## 2011-05-07 MED ORDER — DIAZEPAM 5 MG PO TABS
30.0000 mg | ORAL_TABLET | Freq: Once | ORAL | Status: AC
  Administered 2011-05-07: 30 mg via ORAL
  Filled 2011-05-07: qty 6

## 2011-05-07 MED ORDER — DIAZEPAM 5 MG/ML IJ SOLN
5.0000 mg | Freq: Once | INTRAMUSCULAR | Status: AC
  Administered 2011-05-07: 5 mg via INTRAVENOUS

## 2011-05-07 NOTE — ED Notes (Signed)
Patient is AOx4 and comfortable with her discharge instructions. 

## 2011-05-07 NOTE — Discharge Instructions (Signed)

## 2011-05-07 NOTE — ED Provider Notes (Signed)
History     CSN: [id number]  Arrival date & time 05/07/11  1433   First MD Initiated Contact with Patient 05/07/11 1502      Chief Complaint  Patient presents with  . Dizziness    (Consider location/radiation/quality/duration/timing/severity/associated sxs/prior treatment) Patient is a 46 y.o. female presenting with weakness. The history is provided by the spouse and medical records.  Weakness The primary symptoms include headaches, dizziness, paresthesias, nausea and vomiting. Primary symptoms do not include syncope, loss of consciousness, focal weakness or speech change. The symptoms began yesterday. The symptoms are worsening. The neurological symptoms are diffuse. Context: Started yesterday evening and worsened today.  The headache is associated with paresthesias, weakness and loss of balance. The headache is not associated with photophobia or neck stiffness.  She describes the dizziness as a sensation of spinning. The dizziness began yesterday. The dizziness has been gradually worsening since its onset. It is a new problem. The dizziness is associated with prolonged standing and rotation (No recent illness or new medications. Because of persistent vomiting due to the dizziness she has been unable to hold down her regular medications including Valium which is used to treat her dystonia). Dizziness also occurs with nausea, vomiting and weakness. Dizziness does not occur with blurred vision or tinnitus.  Paresthesias began greater than 24 hours ago. The paresthesias are unchanged. The paresthesias are described as tingling. Affected locations include the: left side of the face and left distal leg.  Additional symptoms include weakness, pain, loss of balance and vertigo. Additional symptoms do not include neck stiffness, photophobia or tinnitus. Medical issues do not include seizures. Associated medical issues comments: Neuromuscular disorder.    Past Medical History  Diagnosis Date  .  Anxiety   . Neuromuscular disorder   . Thyroid disease   . Basal cell carcinoma     Multiple    Past Surgical History  Procedure Date  . Cholecystectomy   . Tonsillectomy and adenoidectomy     Family History  Problem Relation Age of Onset  . Cancer Father 50    melanoma  . Scleroderma Brother     History  Substance Use Topics  . Smoking status: Never Smoker   . Smokeless tobacco: Not on file  . Alcohol Use: No    OB History    Grav Para Term Preterm Abortions TAB SAB Ect Mult Living                  Review of Systems  HENT: Negative for neck stiffness and tinnitus.   Eyes: Negative for blurred vision and photophobia.  Cardiovascular: Negative for syncope.  Gastrointestinal: Positive for nausea and vomiting.  Neurological: Positive for dizziness, vertigo, weakness, headaches, paresthesias and loss of balance. Negative for speech change, focal weakness, loss of consciousness, syncope and speech difficulty.  All other systems reviewed and are negative.    Allergies  Codeine; Fentanyl; Gabapentin; Imuran; Lyrica; and Methotrexate derivatives  Home Medications   Current Outpatient Rx  Name Route Sig Dispense Refill  . BACLOFEN 10 MG PO TABS Oral Take 10 mg by mouth at bedtime.     [name] CALCIUM CARBONATE 600 MG PO TABS Oral Take 600 mg by mouth daily.    [name] VITAMIN D 1000 UNITS PO TABS Oral Take 1,000 Units by mouth daily.    [name] DIAZEPAM 10 MG PO TABS Oral Take 20-30 mg by mouth as directed. 3 at 6 am, 2 at noon, 2 at 5pm and 3 at bedtime    .  LEVOTHYROXINE SODIUM 50 MCG PO TABS Oral Take 100 mcg by mouth daily.     [name] LIOTHYRONINE SODIUM 25 MCG PO TABS Oral Take 12.5 mcg by mouth 2 (two) times daily.     [name] MEPERIDINE HCL 50 MG PO TABS Oral Take 50 mg by mouth 4 (four) times daily.    [name] ONDANSETRON HCL 4 MG PO TABS Oral Take 4 mg by mouth as needed. For nausea    . VITAMIN B-12 1000 MCG PO TABS Oral Take 1,000 mcg by mouth daily.    [name] BUTALBITAL-APAP-CAFFEINE 50-500-40  MG PO TABS Oral Take 1 tablet by mouth as needed. For migraines      BP 107/80  Pulse 74  Temp(Src) 99.1 F (37.3 C) (Oral)  Resp 22  SpO2 100%  Physical Exam  Nursing note and vitals reviewed. Constitutional: She is oriented to person, place, and time. She appears well-developed and well-nourished. She appears distressed.  HENT:  Head: Normocephalic and atraumatic.  Right Ear: Tympanic membrane and ear canal normal.  Left Ear: Tympanic membrane and ear canal normal.  Eyes: EOM are normal.       Patient will not open her eyes voluntarily. Difficult to test for nystagmus  Cardiovascular: Regular rhythm, normal heart sounds and intact distal pulses.  Tachycardia present.  Exam reveals no friction rub.   No murmur heard. Pulmonary/Chest: Effort normal and breath sounds normal. She has no wheezes. She has no rales.  Abdominal: Soft. Bowel sounds are normal. She exhibits no distension. There is no tenderness. There is no rebound and no guarding.  Musculoskeletal: Normal range of motion. She exhibits no tenderness.       No edema  Neurological: She is alert and oriented to person, place, and time. She has normal strength. A sensory deficit is present. She displays no Babinski's sign on the right side. She displays no Babinski's sign on the left side.  Reflex Scores:      Patellar reflexes are 4+ on the right side and 4+ on the left side.      Achilles reflexes are 4+ on the right side and 4+ on the left side.      Right-sided ptosis, decreased sensation in the left side of face and lower extremity.  Increased tone in bilateral upper extremities making it impossible for patient to test hand grip  Skin: Skin is warm and dry. No rash noted.  Psychiatric: She has a normal mood and affect. Her behavior is normal.    ED Course  Procedures (including critical care time)  Labs Reviewed  CBC - Abnormal; Notable for the following:    HCT 35.5 (*)    All other components within normal limits    COMPREHENSIVE METABOLIC PANEL - Abnormal; Notable for the following:    Total Bilirubin 0.2 (*)    All other components within normal limits  DIFFERENTIAL  MAGNESIUM  URINALYSIS, ROUTINE W REFLEX MICROSCOPIC   Mr Brain Wo Contrast  05/07/2011  *RADIOLOGY REPORT*  Clinical Data: Dizziness.  Rule out cerebellar stroke.  Recent GI illness.  MRI HEAD WITHOUT CONTRAST  Technique:  Multiplanar, multiecho pulse sequences of the brain and surrounding structures were obtained according to standard protocol without intravenous contrast.  Comparison: None.  Findings: The diffusion weighted images demonstrate no evidence for acute or subacute infarction.  No hemorrhage or mass lesion is present.  There is no significant white matter disease.  The ventricles are of normal size.  No significant extra-axial fluid collection is present.  The globes and orbits are intact.  Flow is present in the major intracranial arteries.  The paranasal sinuses and mastoid air cells are clear.  IMPRESSION: Negative MRI of the brain.  Original Report Authenticated By: Jamesetta Orleans. MATTERN, M.D.     No diagnosis found.    MDM   Patient sent in by Dr. love with neurology in 2 to vertiginous symptoms for the last 24 hours. Per the patient's husband they are worse with head movement and when she tries to walk and it caused her to have severe vomiting. She has been unable to hold down her medications. On exam patient is awake but she has a history of dystonia in her bilateral upper extremities are contracted. She isn't making indications that she is hurting but difficult to ascertain where. She can only move her feet minimally but otherwise will not open her eyes to check for minor nystagmus. Husband denies any recent plane trips, URIs her inner ear problems. He states years ago she did have peripheral vertigo. Speaking with her neurologist in the office today she had right ptosis, mild decreased sensation on the left side of face  compared to the right. She is hyperreflexic with spasms and muscle contractions in her upper extremities. Here patient is unable to walk due to her spasms. She has been unable to take her medications and has not had any Valium for at least 6 hours. Will give patient IV fluids and supportive care with IV Valium. CBC, CMP, UA, magnesium to check for abnormalities which may be exacerbating her symptoms. MR of the brain to rule out cerebellar stroke.  6:04 PM Electrolytes are within normal limits. MRI showed no signs of abnormality. Feel that her symptoms are most likely peripheral vertigo. On repeat exam of the patient her dystonia is improved. She is complaining of left-sided headache and states she does have a history of migraines. Will give her a headache cocktail and continue to treat her symptomatically. When she is feeling well and that she will be able to return home.  7:05 PM Patient was able to ambulate to the bathroom without any signs of weakness.  7:52 PM Pt ready for d/c.  Gwyneth Sprout, MD 05/07/11 1952

## 2011-05-07 NOTE — ED Notes (Signed)
Pt presents to ED with dizziness, vomiting and nausea since yesterday. Pt unable to keep medications down for her autoimmune system.

## 2011-05-11 ENCOUNTER — Ambulatory Visit (INDEPENDENT_AMBULATORY_CARE_PROVIDER_SITE_OTHER): Payer: 59 | Admitting: Internal Medicine

## 2011-05-11 ENCOUNTER — Encounter: Payer: Self-pay | Admitting: Internal Medicine

## 2011-05-11 VITALS — BP 100/80 | HR 80 | Temp 97.8°F | Resp 16 | Wt 165.0 lb

## 2011-05-11 DIAGNOSIS — E538 Deficiency of other specified B group vitamins: Secondary | ICD-10-CM

## 2011-05-11 DIAGNOSIS — R42 Dizziness and giddiness: Secondary | ICD-10-CM | POA: Insufficient documentation

## 2011-05-11 DIAGNOSIS — G2582 Stiff-man syndrome: Secondary | ICD-10-CM

## 2011-05-11 DIAGNOSIS — R259 Unspecified abnormal involuntary movements: Secondary | ICD-10-CM

## 2011-05-11 MED ORDER — TRIAMCINOLONE ACETONIDE 0.5 % EX CREA: TOPICAL_CREAM | Freq: Three times a day (TID) | CUTANEOUS | Status: DC

## 2011-05-11 MED ORDER — PREDNISONE 10 MG PO TABS: ORAL_TABLET | ORAL | Status: DC

## 2011-05-11 NOTE — Assessment & Plan Note (Signed)
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  Potential benefits of a long term benzodiazepines  use as well as potential risks  and complications were explained to the patient and were aknowledged.  Potential benefits of a long term opioids use as well as potential risks (i.e. addiction risk, apnea etc) and complications (i.e. Somnolence, constipation and others) were explained to the patient and were aknowledged.

## 2011-05-11 NOTE — Assessment & Plan Note (Signed)
Continue with current prescription therapy as reflected on the Med list.  

## 2011-05-11 NOTE — Patient Instructions (Signed)
Benign Positional Vertigo symptoms on the left. Start Prednisone Start Lisa Bruce - Daroff exercise several times a day as dirrected.

## 2011-05-11 NOTE — Assessment & Plan Note (Addendum)
3/21 BPV  MRI 05/07/11: Narrative: *RADIOLOGY REPORT*  Clinical Data: Dizziness. Rule out cerebellar stroke. Recent GI illness.  MRI HEAD WITHOUT CONTRAST  Technique: Multiplanar, multiecho pulse sequences of the brain and surrounding structures were obtained according to standard protocol without intravenous contrast.  Comparison: None.  Findings: The diffusion weighted images demonstrate no evidence for acute or subacute infarction. No hemorrhage or mass lesion is present. There is no significant white matter disease. The ventricles are of normal size. No significant extra-axial fluid collection is present. The globes and orbits are intact. Flow is present in the major intracranial arteries. The paranasal sinuses and mastoid air cells are clear.  IMPRESSION: Negative MRI of the brain.  Original Report Authenticated By: Jamesetta Orleans. MATTERN, M.D.  Benign Positional Vertigo symptoms on the left. Start Meclizine. Start Francee Piccolo - Daroff exercise several times a day as dirrected.  Prednisone 10 mg: take 4 tabs a day x 3 days; then 3 tabs a day x 4 days; then 2 tabs a day x 4 days, then 1 tab a day x 6 days, then stop. Take pc.

## 2011-05-11 NOTE — Progress Notes (Signed)
Patient ID: Lisa Bruce, female   DOB: 1965-11-08, 46 y.o.   MRN: [id number]  Subjective:    Patient ID: Lisa Bruce, female    DOB: 01-08-1966, 46 y.o.   MRN: [id number]  HPI F/u ER visit on 3/21 - severe vertigo with HA, nausea and vomiting. She saw Dr Sandria Manly and had a brain MRI The patient is here to follow up on chronic spastic disease,depression, anxiety, headaches and chronic moderate myalgia symptoms controlled partially with medicines, diet and exercise. C/o fatigue    Review of Systems  Constitutional: Negative for chills, activity change, appetite change, fatigue and unexpected weight change.  HENT: Negative for congestion, mouth sores and sinus pressure.   Eyes: Negative for visual disturbance.  Respiratory: Negative for cough and chest tightness.   Gastrointestinal: Negative for nausea and abdominal pain.  Genitourinary: Negative for frequency, difficulty urinating and vaginal pain.  Musculoskeletal: Positive for myalgias, back pain, arthralgias and gait problem. Negative for joint swelling.  Skin: Negative for pallor and rash.  Neurological: Positive for dizziness, tremors, weakness, light-headedness and numbness. Negative for headaches.  Psychiatric/Behavioral: Negative for suicidal ideas, confusion and sleep disturbance. The patient is nervous/anxious.        Objective:   Physical Exam  Constitutional: She appears well-developed and well-nourished. No distress.  HENT:  Head: Normocephalic.  Right Ear: External ear normal.  Left Ear: External ear normal.  Nose: Nose normal.  Mouth/Throat: Oropharynx is clear and moist.  Eyes: Conjunctivae are normal. Pupils are equal, round, and reactive to light. Right eye exhibits no discharge. Left eye exhibits no discharge.  Neck: Normal range of motion. Neck supple. No JVD present. No tracheal deviation present. No thyromegaly present.  Cardiovascular: Normal rate, regular rhythm and normal heart sounds.   Pulmonary/Chest:  No stridor. No respiratory distress. She has no wheezes.  Abdominal: Soft. Bowel sounds are normal. She exhibits no distension and no mass. There is no tenderness. There is no rebound and no guarding.  Musculoskeletal: She exhibits tenderness. She exhibits no edema.  Lymphadenopathy:    She has no cervical adenopathy.  Neurological: She displays normal reflexes. No cranial nerve deficit. She exhibits abnormal muscle tone. Coordination abnormal.       spastic  Skin: No rash noted. No erythema.  Psychiatric: Her behavior is normal. Judgment and thought content normal.       sad   Lab Results  Component Value Date   WBC 6.5 05/07/2011   HGB 12.0 05/07/2011   HCT 35.5* 05/07/2011   PLT 273 05/07/2011   GLUCOSE 86 05/07/2011   CHOL 269* 05/22/2010   TRIG 129.0 05/22/2010   HDL 63.70 05/22/2010   LDLDIRECT 180.1 05/22/2010   ALT 12 05/07/2011   AST 17 05/07/2011   NA 137 05/07/2011   K 3.8 05/07/2011   CL 104 05/07/2011   CREATININE 0.70 05/07/2011   BUN 7 05/07/2011   CO2 21 05/07/2011   TSH 0.02* 08/07/2010          Assessment & Plan:

## 2011-10-16 ENCOUNTER — Ambulatory Visit: Payer: 59 | Admitting: Internal Medicine

## 2011-12-07 ENCOUNTER — Encounter: Payer: Self-pay | Admitting: Internal Medicine

## 2011-12-07 ENCOUNTER — Ambulatory Visit (INDEPENDENT_AMBULATORY_CARE_PROVIDER_SITE_OTHER): Payer: BC Managed Care – PPO | Admitting: Internal Medicine

## 2011-12-07 ENCOUNTER — Other Ambulatory Visit (INDEPENDENT_AMBULATORY_CARE_PROVIDER_SITE_OTHER): Payer: BC Managed Care – PPO

## 2011-12-07 VITALS — BP 120/80 | HR 72 | Temp 98.6°F | Resp 16 | Wt 170.0 lb

## 2011-12-07 DIAGNOSIS — E538 Deficiency of other specified B group vitamins: Secondary | ICD-10-CM

## 2011-12-07 DIAGNOSIS — M2560 Stiffness of unspecified joint, not elsewhere classified: Secondary | ICD-10-CM

## 2011-12-07 DIAGNOSIS — G2582 Stiff-man syndrome: Secondary | ICD-10-CM

## 2011-12-07 DIAGNOSIS — R42 Dizziness and giddiness: Secondary | ICD-10-CM

## 2011-12-07 NOTE — Assessment & Plan Note (Signed)
No relapse since last episode

## 2011-12-07 NOTE — Assessment & Plan Note (Signed)
Continue with current prescription therapy as reflected on the Med list.  

## 2011-12-07 NOTE — Progress Notes (Signed)
   Subjective:    Patient ID: Lisa Bruce, female    DOB: 08-08-1965, 46 y.o.   MRN: [id number]  HPI  The patient is here to follow up on chronic stiff/spastic disease,depression, anxiety, headaches and chronic severe myalgia symptoms controlled partially with medicines, diet and exercise. C/o fatigue  BP Readings from Last 3 Encounters:  12/07/11 120/80  05/11/11 100/80  05/07/11 116/57   BP Readings from Last 3 Encounters:  12/07/11 120/80  05/11/11 100/80  05/07/11 116/57      Review of Systems  Constitutional: Negative for chills, activity change, appetite change, fatigue and unexpected weight change.  HENT: Negative for congestion, mouth sores and sinus pressure.   Eyes: Negative for visual disturbance.  Respiratory: Negative for cough and chest tightness.   Gastrointestinal: Negative for nausea and abdominal pain.  Genitourinary: Negative for frequency, difficulty urinating and vaginal pain.  Musculoskeletal: Positive for myalgias, back pain, arthralgias and gait problem. Negative for joint swelling.  Skin: Negative for pallor and rash.  Neurological: Positive for dizziness, tremors, weakness, light-headedness and numbness. Negative for headaches.  Psychiatric/Behavioral: Negative for suicidal ideas, confusion and disturbed wake/sleep cycle. The patient is nervous/anxious.        Objective:   Physical Exam  Constitutional: She appears well-developed and well-nourished. No distress.  HENT:  Head: Normocephalic.  Right Ear: External ear normal.  Left Ear: External ear normal.  Nose: Nose normal.  Mouth/Throat: Oropharynx is clear and moist.  Eyes: Conjunctivae normal are normal. Pupils are equal, round, and reactive to light. Right eye exhibits no discharge. Left eye exhibits no discharge.  Neck: Normal range of motion. Neck supple. No JVD present. No tracheal deviation present. No thyromegaly present.  Cardiovascular: Normal rate, regular rhythm and normal heart  sounds.   Pulmonary/Chest: No stridor. No respiratory distress. She has no wheezes.  Abdominal: Soft. Bowel sounds are normal. She exhibits no distension and no mass. There is no tenderness. There is no rebound and no guarding.  Musculoskeletal: She exhibits tenderness. She exhibits no edema.  Lymphadenopathy:    She has no cervical adenopathy.  Neurological: She displays normal reflexes. No cranial nerve deficit. She exhibits abnormal muscle tone. Coordination abnormal.       spastic  Skin: No rash noted. No erythema.  Psychiatric: Her behavior is normal. Judgment and thought content normal.       sad    Lab Results  Component Value Date   WBC 6.5 05/07/2011   HGB 12.0 05/07/2011   HCT 35.5* 05/07/2011   PLT 273 05/07/2011   GLUCOSE 86 05/07/2011   CHOL 269* 05/22/2010   TRIG 129.0 05/22/2010   HDL 63.70 05/22/2010   LDLDIRECT 180.1 05/22/2010   ALT 12 05/07/2011   AST 17 05/07/2011   NA 137 05/07/2011   K 3.8 05/07/2011   CL 104 05/07/2011   CREATININE 0.70 05/07/2011   BUN 7 05/07/2011   CO2 21 05/07/2011   TSH 0.02* 08/07/2010         Assessment & Plan:

## 2011-12-07 NOTE — Assessment & Plan Note (Addendum)
Continue with current symptomatic prescription therapy as reflected on the Med list - Baclofen, Valium and Demerol

## 2011-12-07 NOTE — Assessment & Plan Note (Signed)
Continue with current symptomatic prescription therapy as reflected on the Med list - Baclofen, Valium and Demerol  

## 2011-12-10 LAB — VITAMIN B12: Vitamin B-12: 367 pg/mL (ref 211–911)

## 2012-04-06 ENCOUNTER — Encounter: Payer: Self-pay | Admitting: Internal Medicine

## 2012-04-06 ENCOUNTER — Ambulatory Visit (INDEPENDENT_AMBULATORY_CARE_PROVIDER_SITE_OTHER): Payer: BC Managed Care – PPO | Admitting: Internal Medicine

## 2012-04-06 VITALS — BP 122/90 | HR 90 | Temp 97.9°F | Resp 16 | Wt 172.0 lb

## 2012-04-06 DIAGNOSIS — M2560 Stiffness of unspecified joint, not elsewhere classified: Secondary | ICD-10-CM

## 2012-04-06 DIAGNOSIS — E538 Deficiency of other specified B group vitamins: Secondary | ICD-10-CM

## 2012-04-06 DIAGNOSIS — R42 Dizziness and giddiness: Secondary | ICD-10-CM

## 2012-04-06 DIAGNOSIS — G2582 Stiff-man syndrome: Secondary | ICD-10-CM

## 2012-04-06 MED ORDER — ERGOCALCIFEROL 1.25 MG (50000 UT) PO CAPS: 50000.0000 [IU] | ORAL_CAPSULE | ORAL | Status: DC

## 2012-04-06 MED ORDER — LINACLOTIDE 145 MCG PO CAPS: 145.0000 ug | ORAL_CAPSULE | Freq: Every day | ORAL | Status: DC | PRN

## 2012-04-06 NOTE — Assessment & Plan Note (Signed)
Start Rx again

## 2012-04-06 NOTE — Progress Notes (Signed)
   Subjective:    HPI  The patient is here to follow up on chronic stiff/spastic disease,depression, anxiety, headaches and chronic severe myalgia symptoms controlled partially with medicines, diet and exercise. C/o fatigue. C/o face tremors.   BP Readings from Last 3 Encounters:  04/06/12 122/90  12/07/11 120/80  05/11/11 100/80   BP Readings from Last 3 Encounters:  04/06/12 122/90  12/07/11 120/80  05/11/11 100/80      Review of Systems  Constitutional: Negative for chills, activity change, appetite change, fatigue and unexpected weight change.  HENT: Negative for congestion, mouth sores and sinus pressure.   Eyes: Negative for visual disturbance.  Respiratory: Negative for cough and chest tightness.   Gastrointestinal: Negative for nausea and abdominal pain.  Genitourinary: Negative for frequency, difficulty urinating and vaginal pain.  Musculoskeletal: Positive for myalgias, back pain, arthralgias and gait problem. Negative for joint swelling.  Skin: Negative for pallor and rash.  Neurological: Positive for dizziness, tremors, weakness, light-headedness and numbness. Negative for headaches.  Psychiatric/Behavioral: Negative for suicidal ideas, confusion and sleep disturbance. The patient is nervous/anxious.        Objective:   Physical Exam  Constitutional: She appears well-developed and well-nourished. No distress.  HENT:  Head: Normocephalic.  Right Ear: External ear normal.  Left Ear: External ear normal.  Nose: Nose normal.  Mouth/Throat: Oropharynx is clear and moist.  Eyes: Conjunctivae are normal. Pupils are equal, round, and reactive to light. Right eye exhibits no discharge. Left eye exhibits no discharge.  Neck: Normal range of motion. Neck supple. No JVD present. No tracheal deviation present. No thyromegaly present.  Cardiovascular: Normal rate, regular rhythm and normal heart sounds.   Pulmonary/Chest: No stridor. No respiratory distress. She has no  wheezes.  Abdominal: Soft. Bowel sounds are normal. She exhibits no distension and no mass. There is no tenderness. There is no rebound and no guarding.  Musculoskeletal: She exhibits tenderness. She exhibits no edema.  Lymphadenopathy:    She has no cervical adenopathy.  Neurological: She displays normal reflexes. No cranial nerve deficit. She exhibits abnormal muscle tone. Coordination abnormal.  spastic  Skin: No rash noted. No erythema.  Psychiatric: Her behavior is normal. Judgment and thought content normal.  sad    Lab Results  Component Value Date   WBC 6.5 05/07/2011   HGB 12.0 05/07/2011   HCT 35.5* 05/07/2011   PLT 273 05/07/2011   GLUCOSE 86 05/07/2011   CHOL 269* 05/22/2010   TRIG 129.0 05/22/2010   HDL 63.70 05/22/2010   LDLDIRECT 180.1 05/22/2010   ALT 12 05/07/2011   AST 17 05/07/2011   NA 137 05/07/2011   K 3.8 05/07/2011   CL 104 05/07/2011   CREATININE 0.70 05/07/2011   BUN 7 05/07/2011   CO2 21 05/07/2011   TSH 0.02* 08/07/2010         Assessment & Plan:

## 2012-04-06 NOTE — Assessment & Plan Note (Signed)
Continue with current prescription therapy as reflected on the Med list.  

## 2012-04-06 NOTE — Addendum Note (Signed)
Addended by: Tresa Garter on: 04/06/2012 02:06 PM   Modules accepted: Orders, Medications

## 2012-04-06 NOTE — Assessment & Plan Note (Signed)
Discussed - f/u with Dr Sandria Manly is Pending

## 2012-05-12 ENCOUNTER — Other Ambulatory Visit: Payer: Self-pay

## 2012-05-12 MED ORDER — MEPERIDINE HCL 50 MG PO TABS: 50.0000 mg | ORAL_TABLET | Freq: Every day | ORAL | Status: DC

## 2012-05-12 NOTE — Telephone Encounter (Signed)
Former Dr Sandria Manly patient calling to get a refill on Demerol.  Dr Eulah Citizen

## 2012-05-24 ENCOUNTER — Other Ambulatory Visit: Payer: Self-pay | Admitting: Neurology

## 2012-07-04 ENCOUNTER — Telehealth: Payer: Self-pay | Admitting: Neurology

## 2012-07-20 ENCOUNTER — Other Ambulatory Visit: Payer: Self-pay | Admitting: Neurology

## 2012-07-21 ENCOUNTER — Other Ambulatory Visit: Payer: Self-pay | Admitting: Neurology

## 2012-07-21 NOTE — Telephone Encounter (Signed)
Former [facility] Patient assigned to Dr Frances Furbish.  Has appt in July.

## 2012-07-22 NOTE — Telephone Encounter (Signed)
Former [facility] patient assigned to Dr Frances Furbish.  Has an appt in July.  She is requesting a refill on Esgic Plus.  Notr in Cent[facility] from Dr Sandria Manly says: Summary of Call: I called patient about her referral appointment to the pain clinic. I spoke to her about management of pain with requirements that pain clinics have and benefits for patient's in the long run that have chronic pain. 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 If she has problems with vertigo or any medical condition other than neurologic between now and then or if she   is uncertain as to the cause of her clinical symptoms, she should get in touch with Dr.Plotnikov, her primary care physician.  Electronically signed by Avie Echevaria MD on 04/28/2012 at 7:27 PM Patient called requesting that Dr Frances Furbish fill this med.  Would you like to prescribe?  Please advise.  Thank you.

## 2012-08-03 ENCOUNTER — Ambulatory Visit (INDEPENDENT_AMBULATORY_CARE_PROVIDER_SITE_OTHER): Payer: BC Managed Care – PPO | Admitting: Internal Medicine

## 2012-08-03 ENCOUNTER — Encounter: Payer: Self-pay | Admitting: Internal Medicine

## 2012-08-03 VITALS — BP 102/80 | HR 76 | Temp 97.6°F | Resp 16 | Wt 170.0 lb

## 2012-08-03 DIAGNOSIS — R42 Dizziness and giddiness: Secondary | ICD-10-CM

## 2012-08-03 DIAGNOSIS — M2560 Stiffness of unspecified joint, not elsewhere classified: Secondary | ICD-10-CM

## 2012-08-03 DIAGNOSIS — E559 Vitamin D deficiency, unspecified: Secondary | ICD-10-CM

## 2012-08-03 DIAGNOSIS — E538 Deficiency of other specified B group vitamins: Secondary | ICD-10-CM

## 2012-08-03 NOTE — Assessment & Plan Note (Signed)
Continue with current prescription therapy as reflected on the Med list.  

## 2012-08-03 NOTE — Assessment & Plan Note (Signed)
05/07/11, 5/14 - BPV vs other  MRI 05/07/11: Narrative: *RADIOLOGY REPORT*  Clinical Data: Dizziness. Rule out cerebellar stroke. Recent GI illness.  MRI HEAD WITHOUT CONTRAST  Technique: Multiplanar, multiecho pulse sequences of the brain and surrounding structures were obtained according to standard protocol without intravenous contrast.  Comparison: None.  Findings: The diffusion weighted images demonstrate no evidence for acute or subacute infarction. No hemorrhage or mass lesion is present. There is no significant white matter disease. The ventricles are of normal size. No significant extra-axial fluid collection is present. The globes and orbits are intact. Flow is present in the major intracranial arteries. The paranasal sinuses and mastoid air cells are clear.  IMPRESSION: Negative MRI of the brain.  Original Report Authenticated By: Jamesetta Orleans. MATTERN, M.D.

## 2012-08-03 NOTE — Assessment & Plan Note (Signed)
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 She had a muscle bx at [facility] positive for granulomatous inflamation / sarcoidosis Pos ANA  Potential benefits of a long term steroid  use as well as potential risks  and complications were explained to the patient and were aknowledged.

## 2012-08-03 NOTE — Progress Notes (Signed)
   Subjective:    HPI  C/o recurrent vertigo episode  May18 - June 9th - dizzy, pulling to the side, foggy. The patient is here to follow up on chronic stiff/spastic disease,depression, anxiety, headaches and chronic severe myalgia symptoms controlled partially with medicines, diet and exercise. C/o fatigue. C/o face tremors.   BP Readings from Last 3 Encounters:  08/03/12 102/80  04/06/12 122/90  12/07/11 120/80   BP Readings from Last 3 Encounters:  08/03/12 102/80  04/06/12 122/90  12/07/11 120/80      Review of Systems  Constitutional: Negative for chills, activity change, appetite change, fatigue and unexpected weight change.  HENT: Negative for congestion, mouth sores and sinus pressure.   Eyes: Negative for visual disturbance.  Respiratory: Negative for cough and chest tightness.   Gastrointestinal: Negative for nausea and abdominal pain.  Genitourinary: Negative for frequency, difficulty urinating and vaginal pain.  Musculoskeletal: Positive for myalgias, back pain, arthralgias and gait problem. Negative for joint swelling.  Skin: Negative for pallor and rash.  Neurological: Positive for dizziness, tremors, weakness, light-headedness and numbness. Negative for headaches.  Psychiatric/Behavioral: Negative for suicidal ideas, confusion and sleep disturbance. The patient is nervous/anxious.        Objective:   Physical Exam  Constitutional: She appears well-developed and well-nourished. No distress.  HENT:  Head: Normocephalic.  Right Ear: External ear normal.  Left Ear: External ear normal.  Nose: Nose normal.  Mouth/Throat: Oropharynx is clear and moist.  Eyes: Conjunctivae are normal. Pupils are equal, round, and reactive to light. Right eye exhibits no discharge. Left eye exhibits no discharge.  Neck: Normal range of motion. Neck supple. No JVD present. No tracheal deviation present. No thyromegaly present.  Cardiovascular: Normal rate, regular rhythm and  normal heart sounds.   Pulmonary/Chest: No stridor. No respiratory distress. She has no wheezes.  Abdominal: Soft. Bowel sounds are normal. She exhibits no distension and no mass. There is no tenderness. There is no rebound and no guarding.  Musculoskeletal: She exhibits tenderness. She exhibits no edema.  Lymphadenopathy:    She has no cervical adenopathy.  Neurological: She displays normal reflexes. No cranial nerve deficit. She exhibits abnormal muscle tone. Coordination abnormal.  spastic  Skin: No rash noted. No erythema.  Psychiatric: Her behavior is normal. Judgment and thought content normal.  sad    Lab Results  Component Value Date   WBC 6.5 05/07/2011   HGB 12.0 05/07/2011   HCT 35.5* 05/07/2011   PLT 273 05/07/2011   GLUCOSE 86 05/07/2011   CHOL 269* 05/22/2010   TRIG 129.0 05/22/2010   HDL 63.70 05/22/2010   LDLDIRECT 180.1 05/22/2010   ALT 12 05/07/2011   AST 17 05/07/2011   NA 137 05/07/2011   K 3.8 05/07/2011   CL 104 05/07/2011   CREATININE 0.70 05/07/2011   BUN 7 05/07/2011   CO2 21 05/07/2011   TSH 0.02* 08/07/2010    Labs w/Dr Juleen China q 4 mo     Assessment & Plan:

## 2012-08-11 ENCOUNTER — Ambulatory Visit (INDEPENDENT_AMBULATORY_CARE_PROVIDER_SITE_OTHER): Payer: BC Managed Care – PPO | Admitting: Internal Medicine

## 2012-08-11 ENCOUNTER — Encounter: Payer: Self-pay | Admitting: Internal Medicine

## 2012-08-11 VITALS — BP 130/70 | HR 80 | Temp 97.9°F | Resp 16 | Wt 167.0 lb

## 2012-08-11 DIAGNOSIS — L989 Disorder of the skin and subcutaneous tissue, unspecified: Secondary | ICD-10-CM

## 2012-08-14 ENCOUNTER — Encounter: Payer: Self-pay | Admitting: Internal Medicine

## 2012-08-14 DIAGNOSIS — L820 Inflamed seborrheic keratosis: Secondary | ICD-10-CM | POA: Insufficient documentation

## 2012-08-14 NOTE — Progress Notes (Signed)
  Subjective:    Patient ID: Lisa Bruce, female    DOB: April 18, 1965, 47 y.o.   MRN: [id number]  HPI  She complains about a lesion on her right flank that has been present for about one month. It bleeds intermittently and she wants it to be removed.  Review of Systems  All other systems reviewed and are negative.       Objective:   Physical Exam  Skin:     The area of concern was cleaned with betadine, then it was prepped and draped in sterile fashion. Local anesthesia was obtained with 2% lido with epi, 1.5 cc were used. A shave biopsy was done and the specimen was sent for pathology. Neosporin ointment was placed over the area and a dressing was applied. She tolerated the procedure well with no blood loss or complications.          Assessment & Plan:

## 2012-08-14 NOTE — Assessment & Plan Note (Signed)
Specimen sent for pathology She was educated about wound care She will RTC in one week for pathology results and further advisement

## 2012-08-25 ENCOUNTER — Ambulatory Visit: Payer: BC Managed Care – PPO | Admitting: Internal Medicine

## 2012-08-26 ENCOUNTER — Ambulatory Visit (INDEPENDENT_AMBULATORY_CARE_PROVIDER_SITE_OTHER): Payer: BC Managed Care – PPO | Admitting: Internal Medicine

## 2012-08-26 ENCOUNTER — Encounter: Payer: Self-pay | Admitting: Internal Medicine

## 2012-08-26 VITALS — BP 114/82 | HR 87 | Temp 98.1°F | Resp 16

## 2012-08-26 DIAGNOSIS — L820 Inflamed seborrheic keratosis: Secondary | ICD-10-CM

## 2012-08-26 NOTE — Progress Notes (Signed)
  Subjective:    Patient ID: Lisa Bruce, female    DOB: [birth year], 47 y.o.   MRN: [id number]  HPI  She returns for f/up of lesion over the right side of her trunk. The biopsy showed that it was an inflammed seborrheic keratosis. The lesion is gone and she has no concerns about the area.   Review of Systems  All other systems reviewed and are negative.       Objective:   Physical Exam  Skin: Skin is warm, dry and intact. No abrasion, no bruising, no burn, no ecchymosis, no laceration, no lesion, no petechiae, no purpura and no rash noted. Rash is not macular, not papular, not maculopapular, not nodular, not pustular, not vesicular and not urticarial. She is not diaphoretic. No cyanosis or erythema. No pallor. Nails show no clubbing.          Assessment & Plan:

## 2012-08-26 NOTE — Assessment & Plan Note (Signed)
No further treatment needed

## 2012-09-02 ENCOUNTER — Ambulatory Visit (INDEPENDENT_AMBULATORY_CARE_PROVIDER_SITE_OTHER): Payer: BC Managed Care – PPO | Admitting: Neurology

## 2012-09-02 ENCOUNTER — Encounter: Payer: Self-pay | Admitting: Neurology

## 2012-09-02 VITALS — BP 126/85 | HR 95 | Temp 97.6°F | Ht 66.0 in | Wt 167.0 lb

## 2012-09-02 DIAGNOSIS — M791 Myalgia, unspecified site: Secondary | ICD-10-CM

## 2012-09-02 DIAGNOSIS — G729 Myopathy, unspecified: Secondary | ICD-10-CM

## 2012-09-02 DIAGNOSIS — IMO0001 Reserved for inherently not codable concepts without codable children: Secondary | ICD-10-CM

## 2012-09-02 DIAGNOSIS — M2560 Stiffness of unspecified joint, not elsewhere classified: Secondary | ICD-10-CM

## 2012-09-02 DIAGNOSIS — G894 Chronic pain syndrome: Secondary | ICD-10-CM

## 2012-09-02 NOTE — Progress Notes (Signed)
Subjective:    Patient ID: TEAH VOTAW is a 47 y.o. female.  HPI  Interim history:   Ms. 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 The patient is accompanied by her husband today. She has been seeing Dr. Fayrene Fearing love and was last seen in March of this year at which time he was not sure about the etiology of her pain and consider carbamazepine as well as a pain consult. She presented with heaviness and numbness in her left foot and then progressed to have pain in her proximal legs and stiffness in her arms and legs. 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 In February 2011 her Imuran was discontinued because of lack of a definitive diagnosis and her recurrent skin cancers. She had EMG and nerve conduction studies in July 2011 at [facility] which showed a possible nonspecific myopathic pattern. She has had fatigue and difficulty sleeping at night. She denies bowel and bladder incontinence, swallowing difficulties or slurring of speech. She had a adverse reaction to general anesthesia and was treated with IV percent. She is on baclofen 100 mg at night and IVIG was considered at one point. She has been tried on Sinemet. She had side effects and fentanyl patch and did not like to take  oxycodone or OxyContin she has not yet been to a pain clinic. Repeat MRI in July 2012 was normal. Repeat blood work in April 2012 including CBC, CK, ANA, CMP, lipid profile was negative except for increased cholesterol at 269, rheumatoid factor was negative, ESR was 13, TSH was low, vitamin D was borderline low, B12 was on the lower end at 246 and hepatitis B antibodies were negative. In March 2013 she was referred to the emergency room because of sudden onset of vertigo and had an MRI of brain which was negative. She had a negative DaT scan in December 2013. Dr. Sandria Manly did not try her on a dopamine agonist or Artane because of the negative DaT scan. She had a recent bouts of vertigo.  She is no high dose Demerol and Valium and states that she has severe dystonia today. She has been to tertiary care centers on previous occasions with no significant outcome of that visit. She is tearful today. Her husband provides most of the history.   Her Past Medical History Is Significant For: Past Medical History  Diagnosis Date  . Anxiety   . Neuromuscular disorder   . Thyroid disease   . Basal cell carcinoma     Multiple    Her Past Surgical History Is Significant For: Past Surgical History  Procedure Laterality Date  . Cholecystectomy    . Tonsillectomy and adenoidectomy      Her Family History Is Significant For: Family History  Problem Relation Age of Onset  . Cancer Father 63  melanoma  . Scleroderma Brother     Her Social History Is Significant For: History   Social History  . Marital Status: Married    Spouse Name: Darren    Number of Children: 2  . Years of Education: 14   Occupational History  . other     Stay at home mom   Social History Main Topics  . Smoking status: Never Smoker   . Smokeless tobacco: Never Used  . Alcohol Use: No  . Drug Use: No  . Sexually Active: Yes   Other Topics Concern  . None   Social History Narrative   Patient lives at home with family.    Caffeine Use: occasionally    Her Allergies Are:  Allergies  Allergen Reactions  . Codeine Nausea And Vomiting  . Fentanyl     vomiting  . Gabapentin Other (See Comments)    Sick,loopy  . Imuran (Azathioprine Sodium)     Nausea, chills  . Lyrica (Pregabalin)     loopy  . Methotrexate Derivatives     vomiting  :   Her Current Medications Are:  Outpatient Encounter Prescriptions as of 09/02/2012  Medication Sig Dispense Refill  . baclofen (LIORESAL) 10 MG tablet Take 10 mg by mouth 2 (two) times daily.       . butalbital-acetaminophen-caffeine (FIORICET, ESGIC) 50-325-40 MG per tablet TAKE 1 TABLET BY MOUTH AS NEEDED FOR HEADACHE  15 tablet  1  . cholecalciferol (VITAMIN D) 1000 UNITS tablet Take 1,000 Units by mouth daily.      . diazepam (VALIUM) 10 MG tablet Take 20-30 mg by mouth as directed. 3 at 6 am, 2 at noon, 2 at 5pm and 3 at bedtime      . ergocalciferol (VITAMIN D2) 50000 UNITS capsule Take 1 capsule (50,000 Units total) by mouth every 30 (thirty) days.  3 capsule  3  . levothyroxine (SYNTHROID, LEVOTHROID) 125 MCG tablet Take 125 mcg by mouth daily.      . Linaclotide (LINZESS) 145 MCG CAPS Take 1-2 capsules (145-290 mcg total) by mouth daily as needed.  60 capsule  5  . liothyronine (CYTOMEL) 25 MCG tablet Take 25 mcg by mouth daily.      . meperidine (DEMEROL) 50 MG tablet Take 1 tablet (50 mg total) by mouth 5 (five) times daily.  150 tablet  0  . ondansetron (ZOFRAN) 4 MG tablet TAKE 1 TO 2 TABLETS BY MOUTH EVERY 12 HOURS AS NEEDED FOR NAUSEA  20 tablet  6  . vitamin B-12 (CYANOCOBALAMIN) 1000 MCG tablet Take 1,000 mcg by mouth daily.      . [DISCONTINUED] butalbital-aspirin-caffeine (FIORINAL) 50-325-40 MG per tablet Take 1 tablet by mouth as needed.       No facility-administered encounter medications on file as of 09/02/2012.  :  Review of Systems  Constitutional: Positive for fatigue.  HENT: Positive for trouble swallowing.        Spinning sensation  Eyes:  Positive for visual disturbance (blurred vision).  Respiratory: Positive for shortness of breath.   Gastrointestinal: Positive for constipation.  Endocrine: Positive for heat intolerance. Cold intolerance: feeling hot.  Musculoskeletal: Positive for arthralgias.       Cramps  Neurological: Positive for dizziness, tremors, weakness and headaches.       Difficulty swallowing  Psychiatric/Behavioral: Positive for sleep disturbance (not enough sleep).    Objective:  Neurologic Exam  Physical Exam Physical Examination:   Filed Vitals:   09/02/12 1125  BP: 126/85  Pulse: 95  Temp: 97.6 F (36.4 C)    General Examination: The patient is a 47 y.o. female in no acute distress. She appears well-developed and well-nourished and well groomed and is intermittently quite tearful.   HEENT: Normocephalic, atraumatic, pupils are equal, round and reactive to light and accommodation. Extraocular tracking is good overall but she does not track well. She does not open her mouth fully. Mucosal membranes are intact. Gag is intact. Tongue protrudes centrally but she does not protruded completely. There is no tongue fasciculation. Neck is supple. She has no lip neck or jaw tremor. She has normal blink. She has no significant facial masking.  Chest: Clear to auscultation without wheezing, rhonchi or crackles noted.  Heart: S1+S2+0, regular and normal without murmurs, rubs or gallops noted.   Abdomen: Soft, non-tender and non-distended with normal bowel sounds appreciated on auscultation.  Extremities: There is no pitting edema in the distal lower extremities bilaterally.   Skin: Warm and dry without trophic changes noted. There are no varicose veins.  Musculoskeletal: exam reveals no obvious joint deformities, tenderness or joint swelling or erythema.   Neurologically:  Mental status: The patient is awake, alert and oriented in all 4 spheres. Her memory, attention, language and knowledge are  appropriate, but she is intermittently tearful. There is no aphasia, agnosia, apraxia or anomia. Speech is clear, at times, she speaks softly. Thought process is linear. Affect is flat.  Cranial nerves are as described above under HEENT exam.  Motor exam: Normal bulk, and strength is noted. She has persistent coarse shaking in her right upper extremity side to side with her arm flexed and both hands held in fists. She is unable to open her fists. Reflexes are 2+ throughout. Fine motor skills are impaired in that she is not opening her hands and has a very weak foot taps bilaterally. She has no truncal ataxia or gait ataxia. She stands up pushing herself up with both elbows from the desk. She walks stiffly with a wide based gait and no shuffle. She holds her arms flexed and close to her chest.  Sensory exam shows decreased sensation to pinprick and temperature sense in both lower extremities.                Assessment and Plan:   Assessment and Plan:  In summary, ONEDA DUFFETT is a 47 y.o.-year old female with a history of chronic pain, stiffness, muscle aches. Her presentation is unusual and she is advised that she does not half typical parkinsonian features at this time. At her last visit with Dr. Sandria Manly he suggested pain management and she is established with pain clinic. He was unclear as to the cause of her symptoms according to his last note. I explained my findings to him and also frankly discussed with them that I do not know what else to offer them at this juncture. She was advised to consider followup at one of the tertiary care centers in the area we can consider [facility]. They declined at this time. To that end I will have her see perhaps one of our neuromuscular specialists. I discussed with them that she did not have Parkinson's disease or parkinsonism in my opinion. We will have our nurse Andrey Campanile on call them for her next appointment. I personally believe she would be better served at a  [facility] as she has had various different presentations and symptoms over the course of almost 12 years without any clear answers. As  I understand her last muscle biopsy was in 2006 and showed mild myopathic changes. I am quite concerned that she is on such high dose of pain medication and benzodiazepine. She is followed by pain management. I had a very long discussion with her and her husband today especially explaining to them the limitation of what I can offer. To that end, she declined a referral to tertiary care center and voiced desire to stay locally for neurologic care and requested to see one of my associates. She previously used to see a rheumatologist but currently is not seeing one. This may be another avenue for her.

## 2012-09-02 NOTE — Patient Instructions (Signed)
We will have our nurse, Hermenia Fiscal call you for your appointment.

## 2012-09-05 ENCOUNTER — Other Ambulatory Visit: Payer: Self-pay | Admitting: Neurology

## 2012-09-06 ENCOUNTER — Telehealth: Payer: Self-pay | Admitting: Neurology

## 2012-09-06 MED ORDER — DIAZEPAM 10 MG PO TABS: ORAL_TABLET | ORAL | Status: DC

## 2012-09-06 MED ORDER — BACLOFEN 10 MG PO TABS: 10.0000 mg | ORAL_TABLET | Freq: Two times a day (BID) | ORAL | Status: DC

## 2012-09-06 MED ORDER — BUTALBITAL-APAP-CAFFEINE 50-325-40 MG PO TABS: 1.0000 | ORAL_TABLET | ORAL | Status: DC | PRN

## 2012-09-06 NOTE — Telephone Encounter (Signed)
The diazepam and baclofen cannot be stopped suddenly. If the patient is being referred to the pain Center, these medications will need to be continued until the patient gets to the Center. I'll refill these medications.

## 2012-09-06 NOTE — Telephone Encounter (Signed)
Per Dr Frances Furbish, patient should get refills from Pain Clinic. Huston Foley, MD Message: Yes, she should get these from the pain clinic. She should be re-assigned to another physician soon....) I called the patient back.  Spoke with spouse.  He said the pain doctor will not prescribe these meds.  They want Diazepam as well.  Advised him I will consult MD.   Dr Anne Hahn said he will refill these medications.  I have sent the request to him.   Thank you.

## 2012-10-11 ENCOUNTER — Ambulatory Visit: Payer: BC Managed Care – PPO | Admitting: Internal Medicine

## 2012-12-06 ENCOUNTER — Ambulatory Visit (INDEPENDENT_AMBULATORY_CARE_PROVIDER_SITE_OTHER): Payer: BC Managed Care – PPO | Admitting: Internal Medicine

## 2012-12-06 ENCOUNTER — Encounter: Payer: Self-pay | Admitting: Internal Medicine

## 2012-12-06 VITALS — BP 120/62 | HR 76 | Temp 97.8°F | Resp 16 | Wt 174.0 lb

## 2012-12-06 DIAGNOSIS — E039 Hypothyroidism, unspecified: Secondary | ICD-10-CM

## 2012-12-06 DIAGNOSIS — E538 Deficiency of other specified B group vitamins: Secondary | ICD-10-CM

## 2012-12-06 DIAGNOSIS — G2582 Stiff-man syndrome: Secondary | ICD-10-CM

## 2012-12-06 DIAGNOSIS — E559 Vitamin D deficiency, unspecified: Secondary | ICD-10-CM

## 2012-12-06 NOTE — Assessment & Plan Note (Signed)
Continue with current prescription therapy as reflected on the Med list.  

## 2012-12-06 NOTE — Assessment & Plan Note (Signed)
Dr Antonietta Barcelona [facility])

## 2012-12-06 NOTE — Progress Notes (Signed)
   Subjective:    HPI   The patient is here to follow up on chronic stiff/spastic disease - better on Sinemet (she is seeing a new neurology). F/u depression, anxiety, headaches and chronic severe myalgia symptoms controlled partially with medicines, diet and exercise. F/u fatigue - better  BP Readings from Last 3 Encounters:  12/06/12 120/62  09/02/12 126/85  08/26/12 114/82   BP Readings from Last 3 Encounters:  12/06/12 120/62  09/02/12 126/85  08/26/12 114/82      Review of Systems  Constitutional: Negative for chills, activity change, appetite change, fatigue and unexpected weight change.  HENT: Negative for congestion, mouth sores and sinus pressure.   Eyes: Negative for visual disturbance.  Respiratory: Negative for cough and chest tightness.   Gastrointestinal: Negative for nausea and abdominal pain.  Genitourinary: Negative for frequency, difficulty urinating and vaginal pain.  Musculoskeletal: Positive for arthralgias, back pain, gait problem and myalgias. Negative for joint swelling.  Skin: Negative for pallor and rash.  Neurological: Positive for dizziness, tremors, weakness, light-headedness and numbness. Negative for headaches.  Psychiatric/Behavioral: Negative for suicidal ideas, confusion and sleep disturbance. The patient is nervous/anxious.        Objective:   Physical Exam  Constitutional: She appears well-developed and well-nourished. No distress.  HENT:  Head: Normocephalic.  Right Ear: External ear normal.  Left Ear: External ear normal.  Nose: Nose normal.  Mouth/Throat: Oropharynx is clear and moist.  Eyes: Conjunctivae are normal. Pupils are equal, round, and reactive to light. Right eye exhibits no discharge. Left eye exhibits no discharge.  Neck: Normal range of motion. Neck supple. No JVD present. No tracheal deviation present. No thyromegaly present.  Cardiovascular: Normal rate, regular rhythm and normal heart sounds.   Pulmonary/Chest: No  stridor. No respiratory distress. She has no wheezes.  Abdominal: Soft. Bowel sounds are normal. She exhibits no distension and no mass. There is no tenderness. There is no rebound and no guarding.  Musculoskeletal: She exhibits tenderness. She exhibits no edema.  Lymphadenopathy:    She has no cervical adenopathy.  Neurological: She displays normal reflexes. No cranial nerve deficit. She exhibits abnormal muscle tone. Coordination abnormal.  spastic  Skin: No rash noted. No erythema.  Psychiatric: Her behavior is normal. Judgment and thought content normal.  sad  Looks better  Lab Results  Component Value Date   WBC 6.5 05/07/2011   HGB 12.0 05/07/2011   HCT 35.5* 05/07/2011   PLT 273 05/07/2011   GLUCOSE 86 05/07/2011   CHOL 269* 05/22/2010   TRIG 129.0 05/22/2010   HDL 63.70 05/22/2010   LDLDIRECT 180.1 05/22/2010   ALT 12 05/07/2011   AST 17 05/07/2011   NA 137 05/07/2011   K 3.8 05/07/2011   CL 104 05/07/2011   CREATININE 0.70 05/07/2011   BUN 7 05/07/2011   CO2 21 05/07/2011   TSH 0.02* 08/07/2010    Labs w/Dr Juleen China q 4 mo     Assessment & Plan:

## 2012-12-06 NOTE — Patient Instructions (Signed)
Wt Readings from Last 3 Encounters:  12/06/12 174 lb (78.926 kg)  09/02/12 167 lb (75.751 kg)  08/11/12 167 lb (75.751 kg)   Gluten free trial (no wheat products) for 4-6 weeks. OK to use gluten-free bread and gluten-free pasta.  Milk free trial (no milk, ice cream, cheese and yogurt) for 4-6 weeks. OK to use almond, coconut, rice milk. "Almond breeze" brand tastes good.

## 2013-05-10 ENCOUNTER — Ambulatory Visit (INDEPENDENT_AMBULATORY_CARE_PROVIDER_SITE_OTHER): Payer: BC Managed Care – PPO | Admitting: Internal Medicine

## 2013-05-10 ENCOUNTER — Encounter: Payer: Self-pay | Admitting: Internal Medicine

## 2013-05-10 VITALS — BP 120/64 | HR 72 | Temp 97.6°F | Resp 16 | Wt 178.0 lb

## 2013-05-10 DIAGNOSIS — E538 Deficiency of other specified B group vitamins: Secondary | ICD-10-CM

## 2013-05-10 DIAGNOSIS — G2582 Stiff-man syndrome: Secondary | ICD-10-CM

## 2013-05-10 DIAGNOSIS — K5901 Slow transit constipation: Secondary | ICD-10-CM | POA: Insufficient documentation

## 2013-05-10 DIAGNOSIS — M2560 Stiffness of unspecified joint, not elsewhere classified: Secondary | ICD-10-CM

## 2013-05-10 MED ORDER — LINACLOTIDE 145 MCG PO CAPS: 145.0000 ug | ORAL_CAPSULE | Freq: Every day | ORAL | Status: DC | PRN

## 2013-05-10 NOTE — Assessment & Plan Note (Signed)
Continue with current prescription therapy as reflected on the Med list.  

## 2013-05-10 NOTE — Progress Notes (Signed)
   Subjective:    HPI   The patient is here to follow up on chronic stiff/spastic disease - off Sinemet (she is seeing a new neurology doctor). F/u depression, anxiety, headaches and chronic severe myalgia symptoms controlled partially with medicines, diet and exercise. F/u fatigue - better  BP Readings from Last 3 Encounters:  05/10/13 120/64  12/06/12 120/62  09/02/12 126/85   Wt Readings from Last 3 Encounters:  05/10/13 178 lb (80.74 kg)  12/06/12 174 lb (78.926 kg)  09/02/12 167 lb (75.751 kg)       Review of Systems  Constitutional: Negative for chills, activity change, appetite change, fatigue and unexpected weight change.  HENT: Negative for congestion, mouth sores and sinus pressure.   Eyes: Negative for visual disturbance.  Respiratory: Negative for cough and chest tightness.   Gastrointestinal: Negative for nausea and abdominal pain.  Genitourinary: Negative for frequency, difficulty urinating and vaginal pain.  Musculoskeletal: Positive for arthralgias, back pain, gait problem and myalgias. Negative for joint swelling.  Skin: Negative for pallor and rash.  Neurological: Positive for dizziness, tremors, weakness, light-headedness and numbness. Negative for headaches.  Psychiatric/Behavioral: Negative for suicidal ideas, confusion and sleep disturbance. The patient is nervous/anxious.        Objective:   Physical Exam  Constitutional: She appears well-developed and well-nourished. No distress.  HENT:  Head: Normocephalic.  Right Ear: External ear normal.  Left Ear: External ear normal.  Nose: Nose normal.  Mouth/Throat: Oropharynx is clear and moist.  Eyes: Conjunctivae are normal. Pupils are equal, round, and reactive to light. Right eye exhibits no discharge. Left eye exhibits no discharge.  Neck: Normal range of motion. Neck supple. No JVD present. No tracheal deviation present. No thyromegaly present.  Cardiovascular: Normal rate, regular rhythm and  normal heart sounds.   Pulmonary/Chest: No stridor. No respiratory distress. She has no wheezes.  Abdominal: Soft. Bowel sounds are normal. She exhibits no distension and no mass. There is no tenderness. There is no rebound and no guarding.  Musculoskeletal: She exhibits tenderness. She exhibits no edema.  Lymphadenopathy:    She has no cervical adenopathy.  Neurological: She displays normal reflexes. No cranial nerve deficit. She exhibits abnormal muscle tone. Coordination abnormal.  spastic  Skin: No rash noted. No erythema.  Psychiatric: Her behavior is normal. Judgment and thought content normal.  sad  Looks better  Lab Results  Component Value Date   WBC 6.5 05/07/2011   HGB 12.0 05/07/2011   HCT 35.5* 05/07/2011   PLT 273 05/07/2011   GLUCOSE 86 05/07/2011   CHOL 269* 05/22/2010   TRIG 129.0 05/22/2010   HDL 63.70 05/22/2010   LDLDIRECT 180.1 05/22/2010   ALT 12 05/07/2011   AST 17 05/07/2011   NA 137 05/07/2011   K 3.8 05/07/2011   CL 104 05/07/2011   CREATININE 0.70 05/07/2011   BUN 7 05/07/2011   CO2 21 05/07/2011   TSH 0.02* 08/07/2010    Labs w/Dr Wilson Singer q 4 mo     Assessment & Plan:

## 2013-05-10 NOTE — Progress Notes (Signed)
Pre visit review using our clinic review tool, if applicable. No additional management support is needed unless otherwise documented below in the visit note. 

## 2013-05-10 NOTE — Assessment & Plan Note (Signed)
Try linzess

## 2013-05-12 ENCOUNTER — Telehealth: Payer: Self-pay

## 2013-05-12 MED ORDER — LACTULOSE 10 GM/15ML PO SOLN: 20.0000 g | Freq: Two times a day (BID) | ORAL | Status: DC | PRN

## 2013-05-12 NOTE — Telephone Encounter (Signed)
Patient advised a new prescription has been sent to her pharmacy

## 2013-05-12 NOTE — Telephone Encounter (Signed)
Phone call from patient stating she was seen Wed and was prescribed Linzess #[id number]. This is going to cost her $463 after insurance. No generic. She requests a different medication for her digestive problems. Please advise.

## 2013-05-12 NOTE — Telephone Encounter (Signed)
Can try Lactulose - see Rx Thx

## 2013-11-13 ENCOUNTER — Ambulatory Visit: Payer: BC Managed Care – PPO | Admitting: Internal Medicine

## 2014-01-30 ENCOUNTER — Other Ambulatory Visit (INDEPENDENT_AMBULATORY_CARE_PROVIDER_SITE_OTHER): Payer: BC Managed Care – PPO

## 2014-01-30 ENCOUNTER — Encounter: Payer: Self-pay | Admitting: Internal Medicine

## 2014-01-30 ENCOUNTER — Ambulatory Visit (INDEPENDENT_AMBULATORY_CARE_PROVIDER_SITE_OTHER): Payer: BC Managed Care – PPO | Admitting: Internal Medicine

## 2014-01-30 VITALS — BP 118/88 | HR 88 | Temp 98.2°F | Wt 172.0 lb

## 2014-01-30 DIAGNOSIS — E559 Vitamin D deficiency, unspecified: Secondary | ICD-10-CM

## 2014-01-30 DIAGNOSIS — E038 Other specified hypothyroidism: Secondary | ICD-10-CM

## 2014-01-30 DIAGNOSIS — G2582 Stiff-man syndrome: Secondary | ICD-10-CM

## 2014-01-30 DIAGNOSIS — E538 Deficiency of other specified B group vitamins: Secondary | ICD-10-CM

## 2014-01-30 DIAGNOSIS — E034 Atrophy of thyroid (acquired): Secondary | ICD-10-CM

## 2014-01-30 LAB — CBC WITH DIFFERENTIAL/PLATELET
Basophils Absolute: 0 10*3/uL (ref 0.0–0.1)
Basophils Relative: 0.7 % (ref 0.0–3.0)
EOS PCT: 1.6 % (ref 0.0–5.0)
Eosinophils Absolute: 0.1 10*3/uL (ref 0.0–0.7)
HEMATOCRIT: 32.7 % — AB (ref 36.0–46.0)
Hemoglobin: 10.7 g/dL — ABNORMAL LOW (ref 12.0–15.0)
LYMPHS ABS: 2.1 10*3/uL (ref 0.7–4.0)
Lymphocytes Relative: 35.3 % (ref 12.0–46.0)
MCHC: 32.8 g/dL (ref 30.0–36.0)
MCV: 82.6 fl (ref 78.0–100.0)
MONOS PCT: 7.2 % (ref 3.0–12.0)
Monocytes Absolute: 0.4 10*3/uL (ref 0.1–1.0)
Neutro Abs: 3.3 10*3/uL (ref 1.4–7.7)
Neutrophils Relative %: 55.2 % (ref 43.0–77.0)
PLATELETS: 407 10*3/uL — AB (ref 150.0–400.0)
RBC: 3.96 Mil/uL (ref 3.87–5.11)
RDW: 16.4 % — ABNORMAL HIGH (ref 11.5–15.5)
WBC: 5.9 10*3/uL (ref 4.0–10.5)

## 2014-01-30 LAB — BASIC METABOLIC PANEL
BUN: 11 mg/dL (ref 6–23)
CO2: 19 meq/L (ref 19–32)
Calcium: 8.8 mg/dL (ref 8.4–10.5)
Chloride: 108 mEq/L (ref 96–112)
Creatinine, Ser: 1 mg/dL (ref 0.4–1.2)
GFR: 66.77 mL/min (ref >60.00)
Glucose, Bld: 91 mg/dL (ref 70–99)
Potassium: 3.6 mEq/L (ref 3.5–5.1)
SODIUM: 133 meq/L — AB (ref 135–145)

## 2014-01-30 LAB — HEPATIC FUNCTION PANEL
ALT: 17 U/L (ref 0–35)
AST: 21 U/L (ref 0–37)
Albumin: 4.1 g/dL (ref 3.5–5.2)
Alkaline Phosphatase: 77 U/L (ref 39–117)
Bilirubin, Direct: 0 mg/dL (ref 0.0–0.3)
Total Bilirubin: 0.2 mg/dL (ref 0.2–1.2)
Total Protein: 7.9 g/dL (ref 6.0–8.3)

## 2014-01-30 LAB — VITAMIN B12: Vitamin B-12: 975 pg/mL — ABNORMAL HIGH (ref 211–911)

## 2014-01-30 LAB — VITAMIN D 25 HYDROXY (VIT D DEFICIENCY, FRACTURES): VITD: 12.92 ng/mL — AB (ref 30.00–100.00)

## 2014-01-30 MED ORDER — ONDANSETRON HCL 4 MG PO TABS: ORAL_TABLET | ORAL | Status: DC

## 2014-01-30 MED ORDER — DIAZEPAM 10 MG PO TABS: ORAL_TABLET | ORAL | Status: DC

## 2014-01-30 MED ORDER — BACLOFEN 10 MG PO TABS: 10.0000 mg | ORAL_TABLET | Freq: Two times a day (BID) | ORAL | Status: DC

## 2014-01-30 MED ORDER — MEPERIDINE HCL 50 MG PO TABS: 50.0000 mg | ORAL_TABLET | ORAL | Status: DC | PRN

## 2014-01-30 MED ORDER — BUTALBITAL-APAP-CAFFEINE 50-325-40 MG PO TABS: 1.0000 | ORAL_TABLET | ORAL | Status: DC | PRN

## 2014-01-30 NOTE — Assessment & Plan Note (Addendum)
  Re-start Rx Labs

## 2014-01-30 NOTE — Assessment & Plan Note (Signed)
Re-start Rx Labs

## 2014-01-30 NOTE — Assessment & Plan Note (Signed)
Continue with current prescription therapy as reflected on the Med list.  

## 2014-01-30 NOTE — Progress Notes (Signed)
   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 F/u depression, anxiety, headaches and chronic severe myalgia symptoms controlled partially with medicines, diet and exercise. F/u fatigue - better  BP Readings from Last 3 Encounters:  01/30/14 118/88  05/10/13 120/64  12/06/12 120/62   Wt Readings from Last 3 Encounters:  01/30/14 172 lb (78.019 kg)  05/10/13 178 lb (80.74 kg)  12/06/12 174 lb (78.926 kg)       Review of Systems  Constitutional: Negative for chills, activity change, appetite change, fatigue and unexpected weight change.  HENT: Negative for congestion, mouth sores and sinus pressure.   Eyes: Negative for visual disturbance.  Respiratory: Negative for cough and chest tightness.   Gastrointestinal: Negative for nausea and abdominal pain.  Genitourinary: Negative for frequency, difficulty urinating and vaginal pain.  Musculoskeletal: Positive for myalgias, back pain, arthralgias and gait problem. Negative for joint swelling.  Skin: Negative for pallor and rash.  Neurological: Positive for dizziness, tremors, weakness, light-headedness and numbness. Negative for headaches.  Psychiatric/Behavioral: Negative for suicidal ideas, confusion and sleep disturbance. The patient is nervous/anxious.        Objective:   Physical Exam  Constitutional: She appears well-developed and well-nourished. No distress.  HENT:  Head: Normocephalic.  Right Ear: External ear normal.  Left Ear: External ear normal.  Nose: Nose normal.  Mouth/Throat: Oropharynx is clear and moist.  Eyes: Conjunctivae are normal. Pupils are equal, round, and reactive to light. Right eye exhibits no discharge. Left eye exhibits no discharge.  Neck: Normal range of motion. Neck supple. No JVD present. No tracheal deviation  present. No thyromegaly present.  Cardiovascular: Normal rate, regular rhythm and normal heart sounds.   Pulmonary/Chest: No stridor. No respiratory distress. She has no wheezes.  Abdominal: Soft. Bowel sounds are normal. She exhibits no distension and no mass. There is no tenderness. There is no rebound and no guarding.  Musculoskeletal: She exhibits tenderness. She exhibits no edema.  Lymphadenopathy:    She has no cervical adenopathy.  Neurological: She displays abnormal reflex. A cranial nerve deficit is present. She exhibits abnormal muscle tone. Coordination abnormal.  Skin: No rash noted. No erythema.  Psychiatric: Her behavior is normal. Judgment and thought content normal.  Spastic R arm tremor DTRs hyperactive Anxious  Lab Results  Component Value Date   WBC 6.5 05/07/2011   HGB 12.0 05/07/2011   HCT 35.5* 05/07/2011   PLT 273 05/07/2011   GLUCOSE 86 05/07/2011   CHOL 269* 05/22/2010   TRIG 129.0 05/22/2010   HDL 63.70 05/22/2010   LDLDIRECT 180.1 05/22/2010   ALT 12 05/07/2011   AST 17 05/07/2011   NA 137 05/07/2011   K 3.8 05/07/2011   CL 104 05/07/2011   CREATININE 0.70 05/07/2011   BUN 7 05/07/2011   CO2 21 05/07/2011   TSH 0.02* 08/07/2010    Labs w/Dr Wilson Singer q 4 mo     Assessment & Plan:

## 2014-01-30 NOTE — Assessment & Plan Note (Addendum)
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  Potential benefits of a long term benzodiazepines  use as well as potential risks  and complications were explained to the patient and were aknowledged.  Potential benefits of a long term opioids use as well as potential risks (i.e. addiction risk, apnea etc) and complications (i.e. Somnolence, constipation and others) were explained to the patient and were aknowledged.  Dr Everette Rank [facility]) 2014 Dr Peri Jefferson 2015 - will refer  Continue with current prescription therapy as reflected on the Med list.

## 2014-01-30 NOTE — Progress Notes (Signed)
Pre visit review using our clinic review tool, if applicable. No additional management support is needed unless otherwise documented below in the visit note. 

## 2014-01-31 ENCOUNTER — Other Ambulatory Visit: Payer: Self-pay | Admitting: Internal Medicine

## 2014-01-31 MED ORDER — VITAMIN D3 50 MCG (2000 UT) PO CAPS: 2000.0000 [IU] | ORAL_CAPSULE | Freq: Every day | ORAL | Status: DC

## 2014-01-31 MED ORDER — ERGOCALCIFEROL 1.25 MG (50000 UT) PO CAPS: 50000.0000 [IU] | ORAL_CAPSULE | ORAL | Status: DC

## 2014-02-27 ENCOUNTER — Ambulatory Visit: Payer: BC Managed Care – PPO | Admitting: Internal Medicine

## 2014-03-21 ENCOUNTER — Telehealth: Payer: Self-pay | Admitting: Internal Medicine

## 2014-03-21 NOTE — Telephone Encounter (Signed)
Patient needs her Diazapam refilled.

## 2014-03-22 MED ORDER — DIAZEPAM 10 MG PO TABS: ORAL_TABLET | ORAL | Status: DC

## 2014-03-22 NOTE — Telephone Encounter (Signed)
OK to fill this prescription with additional refills x3 Thank you!  

## 2014-03-22 NOTE — Telephone Encounter (Signed)
Called refill into [facility] had to leave on pharmacist vm approval.../lmb

## 2014-03-23 ENCOUNTER — Other Ambulatory Visit: Payer: Self-pay | Admitting: Internal Medicine

## 2014-03-23 NOTE — Telephone Encounter (Signed)
Ok to refill? Last OV 12.12.15

## 2014-03-28 NOTE — Telephone Encounter (Signed)
Pt called in requesting refill on butalbital-acetaminophen-caffeine (FIORICET, ESGIC) 50-325-40 MG per tablet [[id number]]

## 2014-03-30 NOTE — Telephone Encounter (Signed)
Notified pharmacy spoke with jennifer gave md approval.../lmb

## 2014-04-04 ENCOUNTER — Encounter: Payer: Self-pay | Admitting: Internal Medicine

## 2014-04-04 ENCOUNTER — Ambulatory Visit (INDEPENDENT_AMBULATORY_CARE_PROVIDER_SITE_OTHER): Payer: BLUE CROSS/BLUE SHIELD | Admitting: Internal Medicine

## 2014-04-04 VITALS — BP 110/80 | HR 80 | Temp 98.1°F | Wt 176.0 lb

## 2014-04-04 DIAGNOSIS — R51 Headache: Secondary | ICD-10-CM

## 2014-04-04 DIAGNOSIS — R519 Headache, unspecified: Secondary | ICD-10-CM | POA: Insufficient documentation

## 2014-04-04 DIAGNOSIS — E538 Deficiency of other specified B group vitamins: Secondary | ICD-10-CM

## 2014-04-04 DIAGNOSIS — G2582 Stiff-man syndrome: Secondary | ICD-10-CM

## 2014-04-04 MED ORDER — BUTALBITAL-APAP-CAFFEINE 50-325-40 MG PO TABS: ORAL_TABLET | ORAL | Status: DC

## 2014-04-04 MED ORDER — BACLOFEN 10 MG PO TABS: 10.0000 mg | ORAL_TABLET | Freq: Two times a day (BID) | ORAL | Status: DC

## 2014-04-04 NOTE — Progress Notes (Signed)
Pre visit review using our clinic review tool, if applicable. No additional management support is needed unless otherwise documented below in the visit note. 

## 2014-04-04 NOTE — Assessment & Plan Note (Signed)
Continue with current prescription therapy as reflected on the Med list.  

## 2014-04-04 NOTE — Progress Notes (Signed)
   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 F/u depression, anxiety, headaches and chronic severe myalgia symptoms controlled partially with medicines, diet and exercise. F/u fatigue - better  BP Readings from Last 3 Encounters:  04/04/14 110/80  01/30/14 118/88  05/10/13 120/64   Wt Readings from Last 3 Encounters:  04/04/14 176 lb (79.833 kg)  01/30/14 172 lb (78.019 kg)  05/10/13 178 lb (80.74 kg)       Review of Systems  Constitutional: Negative for chills, activity change, appetite change, fatigue and unexpected weight change.  HENT: Negative for congestion, mouth sores and sinus pressure.   Eyes: Negative for visual disturbance.  Respiratory: Negative for cough and chest tightness.   Gastrointestinal: Negative for nausea and abdominal pain.  Genitourinary: Negative for frequency, difficulty urinating and vaginal pain.  Musculoskeletal: Positive for myalgias, back pain, arthralgias and gait problem. Negative for joint swelling.  Skin: Negative for pallor and rash.  Neurological: Positive for dizziness, tremors, weakness, light-headedness and numbness. Negative for headaches.  Psychiatric/Behavioral: Negative for suicidal ideas, confusion and sleep disturbance. The patient is nervous/anxious.        Objective:   Physical Exam  Constitutional: She appears well-developed and well-nourished. No distress.  HENT:  Head: Normocephalic.  Right Ear: External ear normal.  Left Ear: External ear normal.  Nose: Nose normal.  Mouth/Throat: Oropharynx is clear and moist.  Eyes: Conjunctivae are normal. Pupils are equal, round, and reactive to light. Right eye exhibits no discharge. Left eye exhibits no discharge.  Neck: Normal range of motion. Neck supple. No JVD present. No tracheal deviation present. No  thyromegaly present.  Cardiovascular: Normal rate, regular rhythm and normal heart sounds.   Pulmonary/Chest: No stridor. No respiratory distress. She has no wheezes.  Abdominal: Soft. Bowel sounds are normal. She exhibits no distension and no mass. There is no tenderness. There is no rebound and no guarding.  Musculoskeletal: She exhibits tenderness. She exhibits no edema.  Lymphadenopathy:    She has no cervical adenopathy.  Neurological: She displays abnormal reflex. A cranial nerve deficit is present. She exhibits abnormal muscle tone. Coordination abnormal.  Skin: No rash noted. No erythema.  Psychiatric: Her behavior is normal. Judgment and thought content normal.  Spastic R arm tremor DTRs hyperactive Anxious  Lab Results  Component Value Date   WBC 5.9 01/30/2014   HGB 10.7* 01/30/2014   HCT 32.7* 01/30/2014   PLT 407.0* 01/30/2014   GLUCOSE 91 01/30/2014   CHOL 269* 05/22/2010   TRIG 129.0 05/22/2010   HDL 63.70 05/22/2010   LDLDIRECT 180.1 05/22/2010   ALT 17 01/30/2014   AST 21 01/30/2014   NA 133* 01/30/2014   K 3.6 01/30/2014   CL 108 01/30/2014   CREATININE 1.0 01/30/2014   BUN 11 01/30/2014   CO2 19 01/30/2014   TSH 0.02* 08/07/2010    Labs w/Dr Wilson Singer q 4 mo [name] refused all shots (pneumonia, flu) - 04/04/14     Assessment & Plan:  Patient ID: Lisa Bruce, female   DOB: 01-12-1966, 49 y.o.   MRN: [id number]

## 2014-04-04 NOTE — Assessment & Plan Note (Signed)
Chronic HAs relieved by Fioricet only x many years

## 2014-05-28 DIAGNOSIS — M6289 Other specified disorders of muscle: Secondary | ICD-10-CM | POA: Insufficient documentation

## 2014-06-05 ENCOUNTER — Other Ambulatory Visit: Payer: Self-pay

## 2014-06-05 ENCOUNTER — Telehealth: Payer: Self-pay | Admitting: Internal Medicine

## 2014-06-05 ENCOUNTER — Ambulatory Visit: Payer: BC Managed Care – PPO | Admitting: Internal Medicine

## 2014-06-05 MED ORDER — ONDANSETRON HCL 4 MG PO TABS: ORAL_TABLET | ORAL | Status: DC

## 2014-06-05 MED ORDER — DIAZEPAM 10 MG PO TABS: ORAL_TABLET | ORAL | Status: DC

## 2014-06-05 NOTE — Telephone Encounter (Signed)
Patient and husband waited in lobby, rx for valium and zofran approved per dr plotnikov and scripts were printed for pt to hand carry to pharm per patient request

## 2014-06-05 NOTE — Telephone Encounter (Signed)
Patient came in for appt today which shows cancelled. She had several issues that she wanted to go over. Two regarding current meds.   ondansetron (ZOFRAN) 4 MG tablet [[id number]] - wants to increase to 8 MG. she has 8MG  at home, and she states that it works better.  diazepam (VALIUM) 10 MG tablet [[id number]] - 1 refill remaining, states that she doesnt know if she can make it back in within a month for the last refill for recheck.   Also brought a paper showing other physicians that she has seen. I made a copy of this for your review as well. Can we please have these scripts refilled in the meantime since the patient is currently unable to drive herself to her appt?

## 2014-07-10 ENCOUNTER — Other Ambulatory Visit (INDEPENDENT_AMBULATORY_CARE_PROVIDER_SITE_OTHER): Payer: BLUE CROSS/BLUE SHIELD

## 2014-07-10 ENCOUNTER — Encounter: Payer: Self-pay | Admitting: Internal Medicine

## 2014-07-10 ENCOUNTER — Other Ambulatory Visit: Payer: Self-pay

## 2014-07-10 ENCOUNTER — Ambulatory Visit (INDEPENDENT_AMBULATORY_CARE_PROVIDER_SITE_OTHER): Payer: BLUE CROSS/BLUE SHIELD | Admitting: Internal Medicine

## 2014-07-10 VITALS — BP 120/88 | HR 96 | Wt 174.0 lb

## 2014-07-10 DIAGNOSIS — G2582 Stiff-man syndrome: Secondary | ICD-10-CM | POA: Diagnosis not present

## 2014-07-10 DIAGNOSIS — E559 Vitamin D deficiency, unspecified: Secondary | ICD-10-CM | POA: Diagnosis not present

## 2014-07-10 DIAGNOSIS — R5382 Chronic fatigue, unspecified: Secondary | ICD-10-CM

## 2014-07-10 DIAGNOSIS — E538 Deficiency of other specified B group vitamins: Secondary | ICD-10-CM

## 2014-07-10 DIAGNOSIS — R251 Tremor, unspecified: Secondary | ICD-10-CM | POA: Diagnosis not present

## 2014-07-10 LAB — CBC WITH DIFFERENTIAL/PLATELET
Basophils Absolute: 0 10*3/uL (ref 0.0–0.1)
Basophils Relative: 0.8 % (ref 0.0–3.0)
Eosinophils Absolute: 0 10*3/uL (ref 0.0–0.7)
Eosinophils Relative: 1 % (ref 0.0–5.0)
HCT: 31 % — ABNORMAL LOW (ref 36.0–46.0)
Hemoglobin: 10.2 g/dL — ABNORMAL LOW (ref 12.0–15.0)
Lymphocytes Relative: 41.9 % (ref 12.0–46.0)
Lymphs Abs: 1.9 10*3/uL (ref 0.7–4.0)
MCHC: 32.8 g/dL (ref 30.0–36.0)
MCV: 78.3 fl (ref 78.0–100.0)
MONOS PCT: 7.6 % (ref 3.0–12.0)
Monocytes Absolute: 0.3 10*3/uL (ref 0.1–1.0)
NEUTROS ABS: 2.2 10*3/uL (ref 1.4–7.7)
Neutrophils Relative %: 48.7 % (ref 43.0–77.0)
Platelets: 424 10*3/uL — ABNORMAL HIGH (ref 150.0–400.0)
RBC: 3.96 Mil/uL (ref 3.87–5.11)
RDW: 17.4 % — ABNORMAL HIGH (ref 11.5–15.5)
WBC: 4.6 10*3/uL (ref 4.0–10.5)

## 2014-07-10 LAB — BASIC METABOLIC PANEL
BUN: 5 mg/dL — AB (ref 6–23)
CALCIUM: 8.9 mg/dL (ref 8.4–10.5)
CO2: 24 mEq/L (ref 19–32)
CREATININE: 0.77 mg/dL (ref 0.40–1.20)
Chloride: 103 mEq/L (ref 96–112)
GFR: 84.93 mL/min (ref >60.00)
GLUCOSE: 96 mg/dL (ref 70–99)
POTASSIUM: 3.7 meq/L (ref 3.5–5.1)
Sodium: 133 mEq/L — ABNORMAL LOW (ref 135–145)

## 2014-07-10 LAB — HEPATIC FUNCTION PANEL
ALBUMIN: 4.3 g/dL (ref 3.5–5.2)
ALT: 16 U/L (ref 0–35)
AST: 17 U/L (ref 0–37)
Alkaline Phosphatase: 83 U/L (ref 39–117)
BILIRUBIN DIRECT: 0 mg/dL (ref 0.0–0.3)
BILIRUBIN TOTAL: 0.2 mg/dL (ref 0.2–1.2)
Total Protein: 7.7 g/dL (ref 6.0–8.3)

## 2014-07-10 MED ORDER — DIAZEPAM 10 MG PO TABS: ORAL_TABLET | ORAL | Status: DC

## 2014-07-10 MED ORDER — ONDANSETRON HCL 8 MG PO TABS: 4.0000 mg | ORAL_TABLET | Freq: Three times a day (TID) | ORAL | Status: DC | PRN

## 2014-07-10 MED ORDER — LEVOTHYROXINE SODIUM 137 MCG PO TABS: 137.0000 ug | ORAL_TABLET | Freq: Every day | ORAL | Status: DC

## 2014-07-10 NOTE — Progress Notes (Signed)
   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 F/u depression, anxiety, headaches and chronic severe myalgia symptoms controlled partially with medicines, diet and exercise. F/u fatigue. C/o nausea at times. Dr Vallarie Mare approved Lisa Bruce's current med regimen  BP Readings from Last 3 Encounters:  07/10/14 120/88  04/04/14 110/80  01/30/14 118/88   Wt Readings from Last 3 Encounters:  07/10/14 174 lb (78.926 kg)  04/04/14 176 lb (79.833 kg)  01/30/14 172 lb (78.019 kg)       Review of Systems  Constitutional: Negative for chills, activity change, appetite change, fatigue and unexpected weight change.  HENT: Negative for congestion, mouth sores and sinus pressure.   Eyes: Negative for visual disturbance.  Respiratory: Negative for cough and chest tightness.   Gastrointestinal: Negative for nausea and abdominal pain.  Genitourinary: Negative for frequency, difficulty urinating and vaginal pain.  Musculoskeletal: Positive for myalgias, back pain, arthralgias and gait problem. Negative for joint swelling.  Skin: Negative for pallor and rash.  Neurological: Positive for dizziness, tremors, weakness, light-headedness and numbness. Negative for headaches.  Psychiatric/Behavioral: Negative for suicidal ideas, confusion and sleep disturbance. The patient is nervous/anxious.        Objective:   Physical Exam  Constitutional: She appears well-developed and well-nourished. No distress.  HENT:  Head: Normocephalic.  Right Ear: External ear normal.  Left Ear: External ear normal.  Nose: Nose normal.  Mouth/Throat: Oropharynx is clear and moist.  Eyes: Conjunctivae are normal. Pupils are equal, round, and reactive to light. Right eye exhibits no discharge. Left eye exhibits no discharge.  Neck: Normal range of motion.  Neck supple. No JVD present. No tracheal deviation present. No thyromegaly present.  Cardiovascular: Normal rate, regular rhythm and normal heart sounds.   Pulmonary/Chest: No stridor. No respiratory distress. She has no wheezes.  Abdominal: Soft. Bowel sounds are normal. She exhibits no distension and no mass. There is no tenderness. There is no rebound and no guarding.  Musculoskeletal: She exhibits tenderness. She exhibits no edema.  Lymphadenopathy:    She has no cervical adenopathy.  Neurological: She displays abnormal reflex. A cranial nerve deficit is present. She exhibits abnormal muscle tone. Coordination abnormal.  Skin: No rash noted. No erythema.  Psychiatric: Her behavior is normal. Judgment and thought content normal.  Spastic R arm tremor DTRs hyperactive Anxious  Lab Results  Component Value Date   WBC 5.9 01/30/2014   HGB 10.7* 01/30/2014   HCT 32.7* 01/30/2014   PLT 407.0* 01/30/2014   GLUCOSE 91 01/30/2014   CHOL 269* 05/22/2010   TRIG 129.0 05/22/2010   HDL 63.70 05/22/2010   LDLDIRECT 180.1 05/22/2010   ALT 17 01/30/2014   AST 21 01/30/2014   NA 133* 01/30/2014   K 3.6 01/30/2014   CL 108 01/30/2014   CREATININE 1.0 01/30/2014   BUN 11 01/30/2014   CO2 19 01/30/2014   TSH 0.02* 08/07/2010    Labs w/Dr Wilson Singer q 4 mo Lisa Bruce refused all shots (pneumonia, flu) - 04/04/14     Assessment & Plan:

## 2014-07-10 NOTE — Progress Notes (Signed)
Pre visit review using our clinic review tool, if applicable. No additional management support is needed unless otherwise documented below in the visit note. 

## 2014-07-10 NOTE — Assessment & Plan Note (Signed)
Labs

## 2014-07-10 NOTE — Assessment & Plan Note (Signed)
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 Interaction w/opioid discussed. On Diazepam Stable on current Rx

## 2014-07-10 NOTE — Assessment & Plan Note (Signed)
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  Potential benefits of a long term benzodiazepines  use as well as potential risks  and complications were explained to the patient and were aknowledged.  Potential benefits of a long term opioids use as well as potential risks (i.e. addiction risk, apnea etc) and complications (i.e. Somnolence, constipation and others) were explained to the patient and were aknowledged.  Dr Everette Rank [facility]) 2014 Dr Peri Jefferson 2015  Dr Vallarie Mare approved [name]'s current med regimen

## 2014-07-10 NOTE — Assessment & Plan Note (Signed)
On B12 

## 2014-07-18 ENCOUNTER — Other Ambulatory Visit (INDEPENDENT_AMBULATORY_CARE_PROVIDER_SITE_OTHER): Payer: BLUE CROSS/BLUE SHIELD

## 2014-07-18 ENCOUNTER — Other Ambulatory Visit: Payer: Self-pay | Admitting: Internal Medicine

## 2014-07-18 DIAGNOSIS — E559 Vitamin D deficiency, unspecified: Secondary | ICD-10-CM | POA: Diagnosis not present

## 2014-07-18 LAB — VITAMIN D 25 HYDROXY (VIT D DEFICIENCY, FRACTURES): VITD: 15.88 ng/mL — ABNORMAL LOW (ref 30.00–100.00)

## 2014-07-18 MED ORDER — ERGOCALCIFEROL 1.25 MG (50000 UT) PO CAPS: 50000.0000 [IU] | ORAL_CAPSULE | ORAL | Status: DC

## 2014-08-01 ENCOUNTER — Ambulatory Visit: Payer: BC Managed Care – PPO | Admitting: Internal Medicine

## 2014-08-09 ENCOUNTER — Telehealth: Payer: Self-pay | Admitting: Internal Medicine

## 2014-08-09 NOTE — Telephone Encounter (Signed)
Patient states she is returning your phone call

## 2014-08-13 NOTE — Telephone Encounter (Signed)
Left detailed mess informing pt to return to our lab for the below tests per PCP.   Notes Recorded by Cresenciano Lick, CMA on 08/06/2014 at 4:57 PM Left detailed mess informing pt to please return to our lab if she can to have the below tests collected per MD. Notes Recorded by Cassandria Anger, MD on 08/05/2014 at 5:41 PM Can she come for Vit D, CBC, TIBC? Thx

## 2014-08-16 ENCOUNTER — Other Ambulatory Visit (INDEPENDENT_AMBULATORY_CARE_PROVIDER_SITE_OTHER): Payer: BLUE CROSS/BLUE SHIELD

## 2014-08-16 ENCOUNTER — Other Ambulatory Visit: Payer: Self-pay | Admitting: Internal Medicine

## 2014-08-16 DIAGNOSIS — E559 Vitamin D deficiency, unspecified: Secondary | ICD-10-CM

## 2014-08-16 DIAGNOSIS — D509 Iron deficiency anemia, unspecified: Secondary | ICD-10-CM

## 2014-08-16 LAB — BASIC METABOLIC PANEL
BUN: 5 mg/dL — AB (ref 6–23)
CO2: 26 meq/L (ref 19–32)
CREATININE: 0.79 mg/dL (ref 0.40–1.20)
Calcium: 8.8 mg/dL (ref 8.4–10.5)
Chloride: 99 mEq/L (ref 96–112)
GFR: 82.42 mL/min (ref >60.00)
Glucose, Bld: 92 mg/dL (ref 70–99)
Potassium: 4 mEq/L (ref 3.5–5.1)
Sodium: 131 mEq/L — ABNORMAL LOW (ref 135–145)

## 2014-08-16 LAB — CBC WITH DIFFERENTIAL/PLATELET
Basophils Absolute: 0 10*3/uL (ref 0.0–0.1)
Basophils Relative: 0.5 % (ref 0.0–3.0)
Eosinophils Absolute: 0.1 10*3/uL (ref 0.0–0.7)
Eosinophils Relative: 1.3 % (ref 0.0–5.0)
HCT: 29.2 % — ABNORMAL LOW (ref 36.0–46.0)
Hemoglobin: 9.5 g/dL — ABNORMAL LOW (ref 12.0–15.0)
Lymphocytes Relative: 38.9 % (ref 12.0–46.0)
Lymphs Abs: 1.7 10*3/uL (ref 0.7–4.0)
MCHC: 32.7 g/dL (ref 30.0–36.0)
MCV: 78 fl (ref 78.0–100.0)
Monocytes Absolute: 0.4 10*3/uL (ref 0.1–1.0)
Monocytes Relative: 8.1 % (ref 3.0–12.0)
Neutro Abs: 2.2 10*3/uL (ref 1.4–7.7)
Neutrophils Relative %: 51.2 % (ref 43.0–77.0)
Platelets: 319 10*3/uL (ref 150.0–400.0)
RBC: 3.74 Mil/uL — ABNORMAL LOW (ref 3.87–5.11)
RDW: 17.1 % — ABNORMAL HIGH (ref 11.5–15.5)
WBC: 4.4 10*3/uL (ref 4.0–10.5)

## 2014-08-16 LAB — HEPATIC FUNCTION PANEL
ALBUMIN: 3.8 g/dL (ref 3.5–5.2)
ALT: 12 U/L (ref 0–35)
AST: 19 U/L (ref 0–37)
Alkaline Phosphatase: 74 U/L (ref 39–117)
BILIRUBIN TOTAL: 0.2 mg/dL (ref 0.2–1.2)
Bilirubin, Direct: 0 mg/dL (ref 0.0–0.3)
Total Protein: 7.3 g/dL (ref 6.0–8.3)

## 2014-08-16 LAB — IBC PANEL
Iron: 24 ug/dL — ABNORMAL LOW (ref 42–145)
Saturation Ratios: 6.2 % — ABNORMAL LOW (ref 20.0–50.0)
Transferrin: 276 mg/dL (ref 212.0–360.0)

## 2014-08-17 ENCOUNTER — Telehealth: Payer: Self-pay | Admitting: *Deleted

## 2014-08-17 NOTE — Telephone Encounter (Signed)
Noted. Pls sch OV Thx

## 2014-08-17 NOTE — Telephone Encounter (Signed)
Pt informed- OV scheduled for 08/28/14 at 2:30 with PCP.  Copy of 05/16/14 and 07/10/14 labs given to pt's husband.

## 2014-08-17 NOTE — Telephone Encounter (Signed)
Pt c/o "feeling small peas sized knots x 3 on her abdomen". They are painful to touch. She states she can not do any GI related procedures, but feels like she needs evaluation for knots. Please advise.

## 2014-08-28 ENCOUNTER — Ambulatory Visit (INDEPENDENT_AMBULATORY_CARE_PROVIDER_SITE_OTHER): Payer: BLUE CROSS/BLUE SHIELD | Admitting: Internal Medicine

## 2014-08-28 ENCOUNTER — Encounter: Payer: Self-pay | Admitting: Internal Medicine

## 2014-08-28 VITALS — BP 118/84 | HR 80 | Temp 98.7°F | Wt 175.0 lb

## 2014-08-28 DIAGNOSIS — D509 Iron deficiency anemia, unspecified: Secondary | ICD-10-CM | POA: Diagnosis not present

## 2014-08-28 DIAGNOSIS — R1011 Right upper quadrant pain: Secondary | ICD-10-CM | POA: Diagnosis not present

## 2014-08-28 DIAGNOSIS — E538 Deficiency of other specified B group vitamins: Secondary | ICD-10-CM

## 2014-08-28 MED ORDER — ESOMEPRAZOLE MAGNESIUM 40 MG PO CPDR: 40.0000 mg | DELAYED_RELEASE_CAPSULE | Freq: Every day | ORAL | Status: DC

## 2014-08-28 MED ORDER — FERROUS SULFATE 325 (65 FE) MG PO TABS: 325.0000 mg | ORAL_TABLET | Freq: Two times a day (BID) | ORAL | Status: DC

## 2014-08-28 MED ORDER — LUBIPROSTONE 24 MCG PO CAPS: 24.0000 ug | ORAL_CAPSULE | Freq: Two times a day (BID) | ORAL | Status: DC

## 2014-08-28 MED ORDER — ONDANSETRON HCL 8 MG PO TABS: 4.0000 mg | ORAL_TABLET | Freq: Three times a day (TID) | ORAL | Status: DC | PRN

## 2014-08-28 NOTE — Progress Notes (Signed)
Subjective:  Patient ID: Lisa Bruce, female    DOB: [birth year]  Age: 49 y.o. MRN: [id number]  CC: No chief complaint on file.   HPI KINZI FREDIANI presents for anemia - worse, knots on abdomen. Periods are the same as before. No blood in stool. C/o constipation - chronic. Pt is taking 20 Motrins a day...  Outpatient Prescriptions Prior to Visit  Medication Sig Dispense Refill  . baclofen (LIORESAL) 10 MG tablet Take 1 tablet (10 mg total) by mouth 2 (two) times daily. 180 tablet 1  . butalbital-acetaminophen-caffeine (FIORICET, ESGIC) 50-325-40 MG per tablet TAKE 1 TABLET BY MOUTH AS NEEDED FOR HEADACHE 60 tablet 3  . Cholecalciferol (VITAMIN D3) 2000 UNITS capsule Take 1 capsule (2,000 Units total) by mouth daily. 100 capsule 3  . diazepam (VALIUM) 10 MG tablet 3 at 6 am, 3 at noon, 3 at 5pm and 4 at bedtime 390 tablet 2  . ergocalciferol (VITAMIN D2) 50000 UNITS capsule Take 1 capsule (50,000 Units total) by mouth once a week. 6 capsule 0  . lactulose (CHRONULAC) 10 GM/15ML solution Take 30-45 mLs (20-30 g total) by mouth 2 (two) times daily as needed for mild constipation. 240 mL 0  . levothyroxine (LEVOTHROID) 137 MCG tablet Take 1 tablet (137 mcg total) by mouth daily before breakfast. 100 tablet 3  . liothyronine (CYTOMEL) 25 MCG tablet Take 12.5 mcg by mouth daily.     . meperidine (DEMEROL) 50 MG tablet Take 1 tablet (50 mg total) by mouth every 4 (four) hours as needed for moderate pain or severe pain. 150 tablet 0  . vitamin B-12 (CYANOCOBALAMIN) 1000 MCG tablet Take 1,000 mcg by mouth daily.    . ondansetron (ZOFRAN) 8 MG tablet Take 0.5-1 tablets (4-8 mg total) by mouth every 8 (eight) hours as needed for nausea or vomiting. 20 tablet 1   No facility-administered medications prior to visit.    ROS Review of Systems  Constitutional: Positive for fatigue. Negative for chills, activity change, appetite change and unexpected weight change.  HENT: Negative for congestion,  mouth sores, sinus pressure and voice change.   Eyes: Negative for visual disturbance.  Respiratory: Negative for cough, chest tightness and shortness of breath.   Gastrointestinal: Positive for nausea and constipation. Negative for vomiting and abdominal pain.  Genitourinary: Negative for frequency, difficulty urinating and vaginal pain.  Musculoskeletal: Positive for myalgias, joint swelling, gait problem and neck stiffness. Negative for back pain.  Skin: Negative for pallor and rash.  Neurological: Negative for dizziness, tremors, weakness, numbness and headaches.  Psychiatric/Behavioral: Positive for sleep disturbance. Negative for suicidal ideas and confusion. The patient is nervous/anxious.     Objective:  BP 118/84 mmHg  Pulse 80  Temp(Src) 98.7 F (37.1 C) (Oral)  Wt 175 lb (79.379 kg)  BP Readings from Last 3 Encounters:  08/28/14 118/84  07/10/14 120/88  04/04/14 110/80    Wt Readings from Last 3 Encounters:  08/28/14 175 lb (79.379 kg)  07/10/14 174 lb (78.926 kg)  04/04/14 176 lb (79.833 kg)    Physical Exam  Constitutional: She appears well-developed. No distress.  HENT:  Head: Normocephalic.  Right Ear: External ear normal.  Left Ear: External ear normal.  Nose: Nose normal.  Mouth/Throat: Oropharynx is clear and moist.  Eyes: Conjunctivae are normal. Pupils are equal, round, and reactive to light. Right eye exhibits no discharge. Left eye exhibits no discharge.  Neck: Normal range of motion. Neck supple. No JVD present. No tracheal deviation  present. No thyromegaly present.  Cardiovascular: Normal rate, regular rhythm and normal heart sounds.   Pulmonary/Chest: No stridor. No respiratory distress. She has no wheezes.  Abdominal: Soft. Bowel sounds are normal. She exhibits no distension and no mass. There is no tenderness. There is no rebound and no guarding.  Musculoskeletal: She exhibits no edema or tenderness.  Lymphadenopathy:    She has no cervical  adenopathy.  Neurological: She displays abnormal reflex. No cranial nerve deficit. She exhibits abnormal muscle tone. Coordination abnormal.  Skin: No rash noted. No erythema.  Psychiatric: She has a normal mood and affect. Her behavior is normal. Judgment and thought content normal.  small oval lipoma in RUQ  Lab Results  Component Value Date   WBC 4.4 08/16/2014   HGB 9.5* 08/16/2014   HCT 29.2* 08/16/2014   PLT 319.0 08/16/2014   GLUCOSE 92 08/16/2014   CHOL 269* 05/22/2010   TRIG 129.0 05/22/2010   HDL 63.70 05/22/2010   LDLDIRECT 180.1 05/22/2010   ALT 12 08/16/2014   AST 19 08/16/2014   NA 131* 08/16/2014   K 4.0 08/16/2014   CL 99 08/16/2014   CREATININE 0.79 08/16/2014   BUN 5* 08/16/2014   CO2 26 08/16/2014   TSH 0.02* 08/07/2010    Mr Brain Wo Contrast  05/07/2011   *RADIOLOGY REPORT*  Clinical Data: Dizziness.  Rule out cerebellar stroke.  Recent GI illness.  MRI HEAD WITHOUT CONTRAST  Technique:  Multiplanar, multiecho pulse sequences of the brain and surrounding structures were obtained according to standard protocol without intravenous contrast.  Comparison: None.  Findings: The diffusion weighted images demonstrate no evidence for acute or subacute infarction.  No hemorrhage or mass lesion is present.  There is no significant white matter disease.  The ventricles are of normal size.  No significant extra-axial fluid collection is present.  The globes and orbits are intact.  Flow is present in the major intracranial arteries.  The paranasal sinuses and mastoid air cells are clear.  IMPRESSION: Negative MRI of the brain.  Original Report Authenticated By: Resa Miner. MATTERN, M.D.   Assessment & Plan:   There are no diagnoses linked to this encounter. I am having Ms. Pitones start on esomeprazole, lubiprostone, and ferrous sulfate. I am also having her maintain her vitamin B-12, liothyronine, lactulose, meperidine, Vitamin D3, baclofen,  butalbital-acetaminophen-caffeine, levothyroxine, diazepam, ergocalciferol, ondansetron, and fentaNYL.  Meds ordered this encounter  Medications  . ondansetron (ZOFRAN) 8 MG tablet    Sig: Take 0.5-1 tablets (4-8 mg total) by mouth every 8 (eight) hours as needed for nausea or vomiting.    Dispense:  20 tablet    Refill:  1  . fentaNYL (DURAGESIC - DOSED MCG/HR) 12 MCG/HR    Sig:     Refill:  0  . esomeprazole (NEXIUM) 40 MG capsule    Sig: Take 1 capsule (40 mg total) by mouth daily.    Dispense:  30 capsule    Refill:  3  . lubiprostone (AMITIZA) 24 MCG capsule    Sig: Take 1 capsule (24 mcg total) by mouth 2 (two) times daily with a meal.    Dispense:  60 capsule    Refill:  11  . ferrous sulfate 325 (65 FE) MG tablet    Sig: Take 1 tablet (325 mg total) by mouth 2 (two) times daily with a meal.    Dispense:  60 tablet    Refill:  6     Follow-up: Return in about 4 weeks (  around 09/25/2014) for a follow-up visit.  Walker Kehr, MD

## 2014-08-28 NOTE — Progress Notes (Signed)
Pre visit review using our clinic review tool, if applicable. No additional management support is needed unless otherwise documented below in the visit note. 

## 2014-09-02 ENCOUNTER — Encounter: Payer: Self-pay | Admitting: Internal Medicine

## 2014-09-02 DIAGNOSIS — D649 Anemia, unspecified: Secondary | ICD-10-CM

## 2014-09-02 DIAGNOSIS — R1011 Right upper quadrant pain: Secondary | ICD-10-CM | POA: Insufficient documentation

## 2014-09-02 DIAGNOSIS — D509 Iron deficiency anemia, unspecified: Secondary | ICD-10-CM | POA: Insufficient documentation

## 2014-09-02 HISTORY — DX: Anemia, unspecified: D64.9

## 2014-09-02 NOTE — Assessment & Plan Note (Signed)
Labs Hemoccult cards Pt declined GI consult

## 2014-09-02 NOTE — Assessment & Plan Note (Signed)
On Vit B12 

## 2014-09-02 NOTE — Assessment & Plan Note (Signed)
Abd [place] Check lipoma w/US

## 2014-09-04 ENCOUNTER — Ambulatory Visit
Admission: RE | Admit: 2014-09-04 | Discharge: 2014-09-04 | Disposition: A | Discharge status: Home / Self Care | Payer: BLUE CROSS/BLUE SHIELD | Source: Ambulatory Visit | Attending: Internal Medicine | Admitting: Internal Medicine

## 2014-09-10 ENCOUNTER — Other Ambulatory Visit (INDEPENDENT_AMBULATORY_CARE_PROVIDER_SITE_OTHER): Payer: BLUE CROSS/BLUE SHIELD

## 2014-09-10 DIAGNOSIS — D509 Iron deficiency anemia, unspecified: Secondary | ICD-10-CM | POA: Diagnosis not present

## 2014-09-10 LAB — HEMOCCULT SLIDES (X 3 CARDS)
Fecal Occult Blood: NEGATIVE
OCCULT 1: NEGATIVE
OCCULT 2: NEGATIVE
OCCULT 3: NEGATIVE
OCCULT 4: NEGATIVE
OCCULT 5: NEGATIVE

## 2014-09-13 ENCOUNTER — Other Ambulatory Visit: Payer: Self-pay | Admitting: Internal Medicine

## 2014-09-14 ENCOUNTER — Other Ambulatory Visit: Payer: Self-pay | Admitting: *Deleted

## 2014-09-14 ENCOUNTER — Other Ambulatory Visit: Payer: Self-pay

## 2014-09-14 MED ORDER — BACLOFEN 10 MG PO TABS: 10.0000 mg | ORAL_TABLET | Freq: Two times a day (BID) | ORAL | Status: DC

## 2014-09-14 MED ORDER — LACTULOSE 10 GM/15ML PO SOLN: 20.0000 g | Freq: Two times a day (BID) | ORAL | Status: DC | PRN

## 2014-10-10 ENCOUNTER — Encounter: Payer: Self-pay | Admitting: Internal Medicine

## 2014-10-10 ENCOUNTER — Other Ambulatory Visit: Payer: BLUE CROSS/BLUE SHIELD

## 2014-10-10 ENCOUNTER — Ambulatory Visit (INDEPENDENT_AMBULATORY_CARE_PROVIDER_SITE_OTHER): Payer: BLUE CROSS/BLUE SHIELD | Admitting: Internal Medicine

## 2014-10-10 VITALS — BP 118/84 | HR 80 | Wt 177.0 lb

## 2014-10-10 DIAGNOSIS — E559 Vitamin D deficiency, unspecified: Secondary | ICD-10-CM | POA: Diagnosis not present

## 2014-10-10 DIAGNOSIS — E538 Deficiency of other specified B group vitamins: Secondary | ICD-10-CM | POA: Diagnosis not present

## 2014-10-10 DIAGNOSIS — K5901 Slow transit constipation: Secondary | ICD-10-CM

## 2014-10-10 DIAGNOSIS — E038 Other specified hypothyroidism: Secondary | ICD-10-CM

## 2014-10-10 DIAGNOSIS — E034 Atrophy of thyroid (acquired): Secondary | ICD-10-CM

## 2014-10-10 DIAGNOSIS — G2582 Stiff-man syndrome: Secondary | ICD-10-CM

## 2014-10-10 DIAGNOSIS — E063 Autoimmune thyroiditis: Secondary | ICD-10-CM

## 2014-10-10 MED ORDER — LACTULOSE 10 GM/15ML PO SOLN: 20.0000 g | Freq: Two times a day (BID) | ORAL | Status: DC | PRN

## 2014-10-10 MED ORDER — DIAZEPAM 10 MG PO TABS: ORAL_TABLET | ORAL | Status: DC

## 2014-10-10 NOTE — Progress Notes (Signed)
Pre visit review using our clinic review tool, if applicable. No additional management support is needed unless otherwise documented below in the visit note. 

## 2014-10-10 NOTE — Assessment & Plan Note (Signed)
Lactulose prn 

## 2014-10-10 NOTE — Assessment & Plan Note (Addendum)
Try TRE Diazepam prn (paper Rx for #390 with 3 ref)  Potential benefits of a long term benzodiazepines  use as well as potential risks  and complications were explained to the patient and were aknowledged.

## 2014-10-10 NOTE — Assessment & Plan Note (Signed)
On Vit D 

## 2014-10-10 NOTE — Assessment & Plan Note (Signed)
Labs On B12 

## 2014-10-10 NOTE — Assessment & Plan Note (Signed)
TSH, FT3, FT4 On Rx

## 2014-10-10 NOTE — Patient Instructions (Signed)
TRE - Trauma Release Exercise

## 2014-10-10 NOTE — Progress Notes (Signed)
Subjective:  Patient ID: Lisa Bruce, female    DOB: 04/09/1965  Age: 49 y.o. MRN: [id number]  CC: No chief complaint on file.   HPI Lisa Bruce presents for spasticity, chronic pain, constipation  Outpatient Prescriptions Prior to Visit  Medication Sig Dispense Refill  . baclofen (LIORESAL) 10 MG tablet Take 1 tablet (10 mg total) by mouth 2 (two) times daily. 180 tablet 1  . butalbital-acetaminophen-caffeine (FIORICET, ESGIC) 50-325-40 MG per tablet TAKE 1 TABLET BY MOUTH AS NEEDED FOR HEADACHE 60 tablet 3  . Cholecalciferol (VITAMIN D3) 2000 UNITS capsule Take 1 capsule (2,000 Units total) by mouth daily. 100 capsule 3  . diazepam (VALIUM) 10 MG tablet 3 at 6 am, 3 at noon, 3 at 5pm and 4 at bedtime 390 tablet 2  . ergocalciferol (VITAMIN D2) 50000 UNITS capsule Take 1 capsule (50,000 Units total) by mouth once a week. 6 capsule 0  . esomeprazole (NEXIUM) 40 MG capsule Take 1 capsule (40 mg total) by mouth daily. 30 capsule 3  . lactulose (CHRONULAC) 10 GM/15ML solution Take 30-45 mLs (20-30 g total) by mouth 2 (two) times daily as needed for mild constipation. 240 mL 1  . levothyroxine (LEVOTHROID) 137 MCG tablet Take 1 tablet (137 mcg total) by mouth daily before breakfast. 100 tablet 3  . liothyronine (CYTOMEL) 25 MCG tablet Take 12.5 mcg by mouth daily.     . meperidine (DEMEROL) 50 MG tablet Take 1 tablet (50 mg total) by mouth every 4 (four) hours as needed for moderate pain or severe pain. 150 tablet 0  . ondansetron (ZOFRAN) 8 MG tablet Take 0.5-1 tablets (4-8 mg total) by mouth every 8 (eight) hours as needed for nausea or vomiting. 20 tablet 1  . vitamin B-12 (CYANOCOBALAMIN) 1000 MCG tablet Take 1,000 mcg by mouth daily.    . ferrous sulfate 325 (65 FE) MG tablet Take 1 tablet (325 mg total) by mouth 2 (two) times daily with a meal. (Patient not taking: Reported on 10/10/2014) 60 tablet 6  . lubiprostone (AMITIZA) 24 MCG capsule Take 1 capsule (24 mcg total) by mouth 2  (two) times daily with a meal. (Patient not taking: Reported on 10/10/2014) 60 capsule 11  . fentaNYL (DURAGESIC - DOSED MCG/HR) 12 MCG/HR   0   No facility-administered medications prior to visit.    ROS Review of Systems  Constitutional: Positive for fatigue. Negative for chills, activity change, appetite change and unexpected weight change.  HENT: Negative for congestion, mouth sores and sinus pressure.   Eyes: Negative for visual disturbance.  Respiratory: Negative for cough and chest tightness.   Gastrointestinal: Negative for nausea and abdominal pain.  Genitourinary: Negative for frequency, difficulty urinating and vaginal pain.  Musculoskeletal: Positive for myalgias, back pain, arthralgias, gait problem, neck pain and neck stiffness.  Skin: Negative for pallor and rash.  Neurological: Positive for speech difficulty and weakness. Negative for dizziness, tremors, numbness and headaches.  Hematological: Does not bruise/bleed easily.  Psychiatric/Behavioral: Positive for self-injury. Negative for suicidal ideas, behavioral problems, confusion and sleep disturbance.    Objective:  BP 118/84 mmHg  Pulse 80  Wt 177 lb (80.287 kg)  BP Readings from Last 3 Encounters:  10/10/14 118/84  08/28/14 118/84  07/10/14 120/88    Wt Readings from Last 3 Encounters:  10/10/14 177 lb (80.287 kg)  08/28/14 175 lb (79.379 kg)  07/10/14 174 lb (78.926 kg)    Physical Exam  Constitutional: She appears well-developed. No distress.  HENT:  Head: Normocephalic.  Right Ear: External ear normal.  Left Ear: External ear normal.  Nose: Nose normal.  Mouth/Throat: Oropharynx is clear and moist.  Eyes: Conjunctivae are normal. Pupils are equal, round, and reactive to light. Right eye exhibits no discharge. Left eye exhibits no discharge.  Neck: Normal range of motion. Neck supple. No JVD present. No tracheal deviation present. No thyromegaly present.  Cardiovascular: Normal rate, regular  rhythm and normal heart sounds.   Pulmonary/Chest: No stridor. No respiratory distress. She has no wheezes.  Abdominal: Soft. Bowel sounds are normal. She exhibits no distension and no mass. There is no tenderness. There is no rebound and no guarding.  Musculoskeletal: She exhibits tenderness. She exhibits no edema.  Lymphadenopathy:    She has no cervical adenopathy.  Neurological: She displays normal reflexes. No cranial nerve deficit. She exhibits normal muscle tone. Coordination abnormal.  Skin: No rash noted. No erythema.  Psychiatric: She has a normal mood and affect. Her behavior is normal. Judgment and thought content normal.  ataxic, spastic, rigid  Lab Results  Component Value Date   WBC 4.4 08/16/2014   HGB 9.5* 08/16/2014   HCT 29.2* 08/16/2014   PLT 319.0 08/16/2014   GLUCOSE 92 08/16/2014   CHOL 269* 05/22/2010   TRIG 129.0 05/22/2010   HDL 63.70 05/22/2010   LDLDIRECT 180.1 05/22/2010   ALT 12 08/16/2014   AST 19 08/16/2014   NA 131* 08/16/2014   K 4.0 08/16/2014   CL 99 08/16/2014   CREATININE 0.79 08/16/2014   BUN 5* 08/16/2014   CO2 26 08/16/2014   TSH 0.02* 08/07/2010    US Pelvis Limited  09/04/2014   CLINICAL DATA:  Questionable palpable masses.  EXAM: LIMITED ULTRASOUND OF PELVIS  TECHNIQUE: Limited transabdominal ultrasound examination of the pelvis was performed.  COMPARISON:  PET-CT 10/14/2005.  FINDINGS: No cystic or solid abnormalities identified in the regions of clinical concern. No evidence of hernia.  IMPRESSION: Negative exam.   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   I am having [name] maintain her vitamin B-12, liothyronine, meperidine, Vitamin D3, butalbital-acetaminophen-caffeine, levothyroxine, diazepam, ergocalciferol, ondansetron, esomeprazole, lubiprostone, ferrous sulfate, baclofen, lactulose, and fentaNYL.  Meds ordered this encounter  Medications  . fentaNYL (DURAGESIC - DOSED MCG/HR) 25 MCG/HR patch    Sig: Place 25 mcg onto the skin every 3 (three) days.    Refill:  0     Follow-up: Return for a follow-up visit.  Walker Kehr, MD

## 2014-10-10 NOTE — Assessment & Plan Note (Signed)
Labs

## 2014-10-11 ENCOUNTER — Other Ambulatory Visit: Payer: Self-pay | Admitting: Internal Medicine

## 2014-10-11 ENCOUNTER — Other Ambulatory Visit (INDEPENDENT_AMBULATORY_CARE_PROVIDER_SITE_OTHER): Payer: BLUE CROSS/BLUE SHIELD

## 2014-10-11 DIAGNOSIS — G2582 Stiff-man syndrome: Secondary | ICD-10-CM

## 2014-10-11 DIAGNOSIS — E063 Autoimmune thyroiditis: Secondary | ICD-10-CM

## 2014-10-11 DIAGNOSIS — E559 Vitamin D deficiency, unspecified: Secondary | ICD-10-CM

## 2014-10-11 DIAGNOSIS — E038 Other specified hypothyroidism: Secondary | ICD-10-CM

## 2014-10-11 DIAGNOSIS — E538 Deficiency of other specified B group vitamins: Secondary | ICD-10-CM | POA: Diagnosis not present

## 2014-10-11 DIAGNOSIS — K5901 Slow transit constipation: Secondary | ICD-10-CM | POA: Diagnosis not present

## 2014-10-11 DIAGNOSIS — E034 Atrophy of thyroid (acquired): Secondary | ICD-10-CM

## 2014-10-11 LAB — COMPREHENSIVE METABOLIC PANEL
ALK PHOS: 90 U/L (ref 39–117)
ALT: 28 U/L (ref 0–35)
AST: 22 U/L (ref 0–37)
Albumin: 4.1 g/dL (ref 3.5–5.2)
BUN: 8 mg/dL (ref 6–23)
CO2: 22 mEq/L (ref 19–32)
Calcium: 9.1 mg/dL (ref 8.4–10.5)
Chloride: 103 mEq/L (ref 96–112)
Creatinine, Ser: 0.93 mg/dL (ref 0.40–1.20)
GFR: 68.23 mL/min (ref >60.00)
GLUCOSE: 77 mg/dL (ref 70–99)
POTASSIUM: 4.2 meq/L (ref 3.5–5.1)
SODIUM: 134 meq/L — AB (ref 135–145)
TOTAL PROTEIN: 7.7 g/dL (ref 6.0–8.3)
Total Bilirubin: 0.2 mg/dL (ref 0.2–1.2)

## 2014-10-11 LAB — CBC WITH DIFFERENTIAL/PLATELET
BASOS ABS: 0 10*3/uL (ref 0.0–0.1)
Basophils Relative: 0.7 % (ref 0.0–3.0)
Eosinophils Absolute: 0.1 10*3/uL (ref 0.0–0.7)
Eosinophils Relative: 2.3 % (ref 0.0–5.0)
HCT: 31.5 % — ABNORMAL LOW (ref 36.0–46.0)
Hemoglobin: 10.3 g/dL — ABNORMAL LOW (ref 12.0–15.0)
LYMPHS ABS: 1.9 10*3/uL (ref 0.7–4.0)
Lymphocytes Relative: 39.7 % (ref 12.0–46.0)
MCHC: 32.5 g/dL (ref 30.0–36.0)
MCV: 79 fl (ref 78.0–100.0)
MONO ABS: 0.2 10*3/uL (ref 0.1–1.0)
MONOS PCT: 4.5 % (ref 3.0–12.0)
NEUTROS ABS: 2.5 10*3/uL (ref 1.4–7.7)
NEUTROS PCT: 52.8 % (ref 43.0–77.0)
PLATELETS: 385 10*3/uL (ref 150.0–400.0)
RBC: 3.99 Mil/uL (ref 3.87–5.11)
RDW: 17.3 % — ABNORMAL HIGH (ref 11.5–15.5)
WBC: 4.7 10*3/uL (ref 4.0–10.5)

## 2014-10-11 LAB — VITAMIN B12: Vitamin B-12: 336 pg/mL (ref 211–911)

## 2014-10-11 LAB — IRON: Iron: 25 ug/dL — ABNORMAL LOW (ref 42–145)

## 2014-10-11 LAB — IBC PANEL
IRON: 25 ug/dL — AB (ref 42–145)
Saturation Ratios: 6.2 % — ABNORMAL LOW (ref 20.0–50.0)
TRANSFERRIN: 287 mg/dL (ref 212.0–360.0)

## 2014-10-11 LAB — VITAMIN D 25 HYDROXY (VIT D DEFICIENCY, FRACTURES): VITD: 22.38 ng/mL — AB (ref 30.00–100.00)

## 2014-10-11 LAB — CREATININE KINASE MB: CK-MB: 1.6 ng/mL (ref 0.3–4.0)

## 2014-10-11 LAB — TSH: TSH: 0.15 u[IU]/mL — ABNORMAL LOW (ref 0.35–4.50)

## 2014-10-11 LAB — T4, FREE: FREE T4: 0.81 ng/dL (ref 0.60–1.60)

## 2014-10-11 LAB — T3, FREE: T3, Free: 3.6 pg/mL (ref 2.3–4.2)

## 2014-10-11 MED ORDER — ERGOCALCIFEROL 1.25 MG (50000 UT) PO CAPS: 50000.0000 [IU] | ORAL_CAPSULE | ORAL | Status: DC

## 2014-10-11 NOTE — Addendum Note (Signed)
Addended by: Lyman Bishop on: 10/11/2014 04:56 PM   Modules accepted: Orders

## 2014-10-12 LAB — CK: CK TOTAL: 44 U/L (ref 7–177)

## 2014-10-15 ENCOUNTER — Telehealth: Payer: Self-pay | Admitting: Internal Medicine

## 2014-10-15 NOTE — Telephone Encounter (Signed)
I called pt- I answered all her questions re: recent labs. Copy mailed to pt. I scheduled her 3 mo ROV with PCP per 10/10/14 OV note.

## 2014-10-15 NOTE — Telephone Encounter (Signed)
Patient ask you to call her back, she is sick and need to talk to you.

## 2014-10-23 ENCOUNTER — Other Ambulatory Visit: Payer: Self-pay | Admitting: Internal Medicine

## 2014-10-25 ENCOUNTER — Telehealth: Payer: Self-pay | Admitting: Internal Medicine

## 2014-10-25 NOTE — Telephone Encounter (Signed)
Agree. Pt may need IVF and urgent tests that we can't provide Thx

## 2014-10-25 NOTE — Telephone Encounter (Signed)
PCP aware. He advises go to ER.  I called pt's daughter, Caryl Pina and advised. She states something is going on neurologically.

## 2014-10-25 NOTE — Telephone Encounter (Signed)
Pt daughter called in said that pt as been throwing up for the past 2 days and dizzy.  She has not been able to keep meds down either.  She wanted to know if Dr Camila Li could work her in today or tomorrow?     Best number to call daughter is [phone]

## 2015-01-16 ENCOUNTER — Encounter: Payer: Self-pay | Admitting: Internal Medicine

## 2015-01-16 ENCOUNTER — Ambulatory Visit (INDEPENDENT_AMBULATORY_CARE_PROVIDER_SITE_OTHER): Payer: Self-pay | Admitting: Internal Medicine

## 2015-01-16 ENCOUNTER — Telehealth: Payer: Self-pay | Admitting: *Deleted

## 2015-01-16 VITALS — BP 110/78 | HR 76 | Wt 170.0 lb

## 2015-01-16 DIAGNOSIS — Z23 Encounter for immunization: Secondary | ICD-10-CM

## 2015-01-16 DIAGNOSIS — E559 Vitamin D deficiency, unspecified: Secondary | ICD-10-CM

## 2015-01-16 DIAGNOSIS — E038 Other specified hypothyroidism: Secondary | ICD-10-CM

## 2015-01-16 DIAGNOSIS — D171 Benign lipomatous neoplasm of skin and subcutaneous tissue of trunk: Secondary | ICD-10-CM

## 2015-01-16 DIAGNOSIS — D179 Benign lipomatous neoplasm, unspecified: Secondary | ICD-10-CM

## 2015-01-16 DIAGNOSIS — G2582 Stiff-man syndrome: Secondary | ICD-10-CM

## 2015-01-16 DIAGNOSIS — G44039 Episodic paroxysmal hemicrania, not intractable: Secondary | ICD-10-CM

## 2015-01-16 DIAGNOSIS — E034 Atrophy of thyroid (acquired): Secondary | ICD-10-CM

## 2015-01-16 DIAGNOSIS — E538 Deficiency of other specified B group vitamins: Secondary | ICD-10-CM

## 2015-01-16 MED ORDER — BACLOFEN 10 MG PO TABS: 10.0000 mg | ORAL_TABLET | Freq: Two times a day (BID) | ORAL | Status: DC

## 2015-01-16 MED ORDER — DIAZEPAM 10 MG PO TABS: ORAL_TABLET | ORAL | Status: DC

## 2015-01-16 MED ORDER — CLOBETASOL PROPIONATE 0.05 % EX CREA: 1.0000 "application " | TOPICAL_CREAM | Freq: Two times a day (BID) | CUTANEOUS | Status: DC

## 2015-01-16 MED ORDER — BUTALBITAL-APAP-CAFFEINE 50-325-40 MG PO TABS: ORAL_TABLET | ORAL | Status: DC

## 2015-01-16 MED ORDER — ERGOCALCIFEROL 1.25 MG (50000 UT) PO CAPS: 50000.0000 [IU] | ORAL_CAPSULE | ORAL | Status: DC

## 2015-01-16 NOTE — Assessment & Plan Note (Signed)
On B12 

## 2015-01-16 NOTE — Assessment & Plan Note (Signed)
Take Vit D Labs

## 2015-01-16 NOTE — Assessment & Plan Note (Signed)
Dr Wilson Singer Chronic  On [place]

## 2015-01-16 NOTE — Assessment & Plan Note (Signed)
Chronic HAs relieved by Fioricet only x many years Fioricet

## 2015-01-16 NOTE — Assessment & Plan Note (Signed)
meds renewed - Baclofen, Diazepam

## 2015-01-16 NOTE — Assessment & Plan Note (Signed)
11/16 lipomas vs other. Surgical ref - Dr Dalbert Batman

## 2015-01-16 NOTE — Progress Notes (Signed)
Subjective:  Patient ID: Lisa Bruce, female    DOB: 11-04-1965  Age: 49 y.o. MRN: [id number]  CC: No chief complaint on file.   HPI Lisa Bruce presents for spastic syndrome, chronic pain, HAs. C/o rash on arms from the adhesive. C/o abd masses  Outpatient Prescriptions Prior to Visit  Medication Sig Dispense Refill  . Cholecalciferol (VITAMIN D3) 2000 UNITS capsule Take 1 capsule (2,000 Units total) by mouth daily. 100 capsule 3  . fentaNYL (DURAGESIC - DOSED MCG/HR) 25 MCG/HR patch Place 25 mcg onto the skin every 3 (three) days.  0  . ferrous sulfate 325 (65 FE) MG tablet Take 1 tablet (325 mg total) by mouth 2 (two) times daily with a meal. 60 tablet 6  . lactulose (CHRONULAC) 10 GM/15ML solution Take 30-45 mLs (20-30 g total) by mouth 2 (two) times daily as needed for mild constipation. 240 mL 2  . levothyroxine (LEVOTHROID) 137 MCG tablet Take 1 tablet (137 mcg total) by mouth daily before breakfast. 100 tablet 3  . liothyronine (CYTOMEL) 25 MCG tablet Take 12.5 mcg by mouth daily.     [name] lubiprostone (AMITIZA) 24 MCG capsule Take 1 capsule (24 mcg total) by mouth 2 (two) times daily with a meal. 60 capsule 11  . meperidine (DEMEROL) 50 MG tablet Take 1 tablet (50 mg total) by mouth every 4 (four) hours as needed for moderate pain or severe pain. 150 tablet 0  . ondansetron (ZOFRAN) 8 MG tablet TAKE 0.5-1 TABLETS (4-8 MG TOTAL) BY MOUTH EVERY 8 (EIGHT) HOURS AS NEEDED FOR NAUSEA OR VOMITING. 20 tablet 3  . vitamin B-12 (CYANOCOBALAMIN) 1000 MCG tablet Take 1,000 mcg by mouth daily.    . baclofen (LIORESAL) 10 MG tablet Take 1 tablet (10 mg total) by mouth 2 (two) times daily. 180 tablet 1  . butalbital-acetaminophen-caffeine (FIORICET, ESGIC) 50-325-40 MG per tablet TAKE 1 TABLET BY MOUTH AS NEEDED FOR HEADACHE 60 tablet 3  . diazepam (VALIUM) 10 MG tablet 3 at 6 am, 3 at noon, 3 at 5pm and 4 at bedtime 390 tablet 3  . ergocalciferol (VITAMIN D2) 50000 UNITS capsule Take 1  capsule (50,000 Units total) by mouth once a week. 6 capsule 0  . esomeprazole (NEXIUM) 40 MG capsule Take 1 capsule (40 mg total) by mouth daily. (Patient not taking: Reported on 01/16/2015) 30 capsule 3   No facility-administered medications prior to visit.    ROS Review of Systems  Constitutional: Negative for chills, activity change, appetite change, fatigue and unexpected weight change.  HENT: Negative for congestion, mouth sores and sinus pressure.   Eyes: Negative for visual disturbance.  Respiratory: Negative for cough and chest tightness.   Gastrointestinal: Negative for nausea and abdominal pain.  Genitourinary: Negative for frequency, difficulty urinating and vaginal pain.  Musculoskeletal: Negative for back pain and gait problem.  Skin: Positive for rash. Negative for pallor.  Neurological: Negative for dizziness, tremors, weakness, numbness and headaches.  Psychiatric/Behavioral: Negative for confusion and sleep disturbance.    Objective:  BP 110/78 mmHg  Pulse 76  Wt 170 lb (77.111 kg)  BP Readings from Last 3 Encounters:  01/16/15 110/78  10/10/14 118/84  08/28/14 118/84    Wt Readings from Last 3 Encounters:  01/16/15 170 lb (77.111 kg)  10/10/14 177 lb (80.287 kg)  08/28/14 175 lb (79.379 kg)    Physical Exam  Constitutional: She appears well-developed. No distress.  HENT:  Head: Normocephalic.  Right Ear: External ear normal.  Left Ear: External ear normal.  Nose: Nose normal.  Mouth/Throat: Oropharynx is clear and moist.  Eyes: Conjunctivae are normal. Pupils are equal, round, and reactive to light. Right eye exhibits no discharge. Left eye exhibits no discharge.  Neck: Normal range of motion. Neck supple. No JVD present. No tracheal deviation present. No thyromegaly present.  Cardiovascular: Normal rate, regular rhythm and normal heart sounds.   Pulmonary/Chest: No stridor. No respiratory distress. She has no wheezes.  Abdominal: Soft. Bowel sounds  are normal. She exhibits no distension and no mass. There is no tenderness. There is no rebound and no guarding.  Musculoskeletal: She exhibits no edema or tenderness.  Lymphadenopathy:    She has no cervical adenopathy.  Neurological: She displays normal reflexes. No cranial nerve deficit. She exhibits normal muscle tone. Coordination normal.  Skin: No rash noted. No erythema.  Psychiatric: She has a normal mood and affect. Her behavior is normal. Judgment and thought content normal.  Liomas on abd wall  - tender abd is tender  eryth rash from band-aids on P shoulders  Lab Results  Component Value Date   WBC 4.7 10/11/2014   HGB 10.3* 10/11/2014   HCT 31.5* 10/11/2014   PLT 385.0 10/11/2014   GLUCOSE 77 10/11/2014   CHOL 269* 05/22/2010   TRIG 129.0 05/22/2010   HDL 63.70 05/22/2010   LDLDIRECT 180.1 05/22/2010   ALT 28 10/11/2014   AST 22 10/11/2014   NA 134* 10/11/2014   K 4.2 10/11/2014   CL 103 10/11/2014   CREATININE 0.93 10/11/2014   BUN 8 10/11/2014   CO2 22 10/11/2014   TSH 0.15* 10/11/2014    US Pelvis Limited  09/04/2014  CLINICAL DATA:  Questionable palpable masses. EXAM: LIMITED ULTRASOUND OF PELVIS TECHNIQUE: Limited transabdominal ultrasound examination of the pelvis was performed. COMPARISON:  PET-CT 10/14/2005. FINDINGS: No cystic or solid abnormalities identified in the regions of clinical concern. No evidence of hernia. IMPRESSION: Negative exam. Electronically Signed   By: Marcello Moores  Register   On: 09/04/2014 10:34    Assessment & Plan:   Diagnoses and all orders for this visit:  Lipoma of abdominal wall -     Ambulatory referral to General Surgery  Multiple lipomas  Need for influenza vaccination -     Flu Vaccine QUAD 36+ mos IM  Other orders -     Discontinue: baclofen (LIORESAL) 10 MG tablet; Take 1 tablet (10 mg total) by mouth 2 (two) times daily. -     diazepam (VALIUM) 10 MG tablet; 3 at 6 am, 3 at noon, 3 at 5pm and 4 at bedtime -      butalbital-acetaminophen-caffeine (FIORICET, ESGIC) 50-325-40 MG tablet; TAKE 1 TABLET BY MOUTH AS NEEDED FOR HEADACHE -     clobetasol cream (TEMOVATE) 0.05 %; Apply 1 application topically 2 (two) times daily. -     baclofen (LIORESAL) 10 MG tablet; Take 1 tablet (10 mg total) by mouth 2 (two) times daily.  I have discontinued Ms. Dack's ergocalciferol. I have also changed her butalbital-acetaminophen-caffeine. Additionally, I am having her start on clobetasol cream. Lastly, I am having her maintain her vitamin B-12, liothyronine, meperidine, Vitamin D3, levothyroxine, esomeprazole, lubiprostone, ferrous sulfate, fentaNYL, lactulose, ondansetron, diazepam, and baclofen.  Meds ordered this encounter  Medications  . DISCONTD: baclofen (LIORESAL) 10 MG tablet    Sig: Take 1 tablet (10 mg total) by mouth 2 (two) times daily.    Dispense:  180 tablet    Refill:  1  .  diazepam (VALIUM) 10 MG tablet    Sig: 3 at 6 am, 3 at noon, 3 at 5pm and 4 at bedtime    Dispense:  390 tablet    Refill:  3  . butalbital-acetaminophen-caffeine (FIORICET, ESGIC) 50-325-40 MG tablet    Sig: TAKE 1 TABLET BY MOUTH AS NEEDED FOR HEADACHE    Dispense:  60 tablet    Refill:  3  . clobetasol cream (TEMOVATE) 0.05 %    Sig: Apply 1 application topically 2 (two) times daily.    Dispense:  30 g    Refill:  3  . baclofen (LIORESAL) 10 MG tablet    Sig: Take 1 tablet (10 mg total) by mouth 2 (two) times daily.    Dispense:  180 tablet    Refill:  3     Follow-up: Return in about 3 months (around 04/16/2015) for a follow-up visit.  Walker Kehr, MD

## 2015-01-16 NOTE — Progress Notes (Signed)
Pre visit review using our clinic review tool, if applicable. No additional management support is needed unless otherwise documented below in the visit note. 

## 2015-01-16 NOTE — Patient Instructions (Signed)

## 2015-01-17 ENCOUNTER — Telehealth: Payer: Self-pay | Admitting: Internal Medicine

## 2015-01-17 NOTE — Telephone Encounter (Signed)
Patient would like to hold off on any lab work until her next appointment.  Would like to wait on taking Vit D3 until next appointment as well.  Patient states also wants to hold off on surgeon referral until next visit.

## 2015-01-17 NOTE — Telephone Encounter (Signed)
Noted. Thx.

## 2015-01-18 ENCOUNTER — Other Ambulatory Visit: Payer: Self-pay | Admitting: Internal Medicine

## 2015-01-21 NOTE — Telephone Encounter (Signed)
Called refill into [facility] spoke with Jennie/pharmacist gave md approval.../lmb

## 2015-03-18 NOTE — Telephone Encounter (Signed)
error 

## 2015-04-16 ENCOUNTER — Other Ambulatory Visit: Payer: Self-pay | Admitting: Internal Medicine

## 2015-04-16 ENCOUNTER — Ambulatory Visit (INDEPENDENT_AMBULATORY_CARE_PROVIDER_SITE_OTHER): Payer: 59 | Admitting: Internal Medicine

## 2015-04-16 ENCOUNTER — Other Ambulatory Visit (INDEPENDENT_AMBULATORY_CARE_PROVIDER_SITE_OTHER): Payer: 59

## 2015-04-16 ENCOUNTER — Encounter: Payer: Self-pay | Admitting: Internal Medicine

## 2015-04-16 VITALS — BP 110/70 | HR 80 | Wt 168.0 lb

## 2015-04-16 DIAGNOSIS — M2560 Stiffness of unspecified joint, not elsewhere classified: Secondary | ICD-10-CM

## 2015-04-16 DIAGNOSIS — D171 Benign lipomatous neoplasm of skin and subcutaneous tissue of trunk: Secondary | ICD-10-CM

## 2015-04-16 DIAGNOSIS — E038 Other specified hypothyroidism: Secondary | ICD-10-CM

## 2015-04-16 DIAGNOSIS — G2582 Stiff-man syndrome: Secondary | ICD-10-CM | POA: Diagnosis not present

## 2015-04-16 DIAGNOSIS — E034 Atrophy of thyroid (acquired): Secondary | ICD-10-CM

## 2015-04-16 DIAGNOSIS — E559 Vitamin D deficiency, unspecified: Secondary | ICD-10-CM

## 2015-04-16 DIAGNOSIS — E538 Deficiency of other specified B group vitamins: Secondary | ICD-10-CM

## 2015-04-16 LAB — HEPATIC FUNCTION PANEL
ALT: 18 U/L (ref 0–35)
AST: 21 U/L (ref 0–37)
Albumin: 4.3 g/dL (ref 3.5–5.2)
Alkaline Phosphatase: 87 U/L (ref 39–117)
BILIRUBIN TOTAL: 0.2 mg/dL (ref 0.2–1.2)
Bilirubin, Direct: 0 mg/dL (ref 0.0–0.3)
Total Protein: 7.7 g/dL (ref 6.0–8.3)

## 2015-04-16 LAB — CBC WITH DIFFERENTIAL/PLATELET
BASOS PCT: 0.5 % (ref 0.0–3.0)
Basophils Absolute: 0 10*3/uL (ref 0.0–0.1)
EOS ABS: 0.1 10*3/uL (ref 0.0–0.7)
Eosinophils Relative: 1.2 % (ref 0.0–5.0)
HEMATOCRIT: 29.6 % — AB (ref 36.0–46.0)
Hemoglobin: 9.6 g/dL — ABNORMAL LOW (ref 12.0–15.0)
LYMPHS ABS: 2.1 10*3/uL (ref 0.7–4.0)
LYMPHS PCT: 41.3 % (ref 12.0–46.0)
MCHC: 32.5 g/dL (ref 30.0–36.0)
MCV: 75.7 fl — AB (ref 78.0–100.0)
Monocytes Absolute: 0.5 10*3/uL (ref 0.1–1.0)
Monocytes Relative: 9 % (ref 3.0–12.0)
NEUTROS ABS: 2.4 10*3/uL (ref 1.4–7.7)
NEUTROS PCT: 48 % (ref 43.0–77.0)
PLATELETS: 387 10*3/uL (ref 150.0–400.0)
RBC: 3.92 Mil/uL (ref 3.87–5.11)
RDW: 17.8 % — AB (ref 11.5–15.5)
WBC: 5.1 10*3/uL (ref 4.0–10.5)

## 2015-04-16 LAB — BASIC METABOLIC PANEL
BUN: 6 mg/dL (ref 6–23)
CO2: 24 mEq/L (ref 19–32)
CREATININE: 0.86 mg/dL (ref 0.40–1.20)
Calcium: 8.9 mg/dL (ref 8.4–10.5)
Chloride: 102 mEq/L (ref 96–112)
GFR: 74.52 mL/min (ref >60.00)
Glucose, Bld: 95 mg/dL (ref 70–99)
POTASSIUM: 4.1 meq/L (ref 3.5–5.1)
Sodium: 133 mEq/L — ABNORMAL LOW (ref 135–145)

## 2015-04-16 LAB — VITAMIN B12: VITAMIN B 12: 329 pg/mL (ref 211–911)

## 2015-04-16 LAB — VITAMIN D 25 HYDROXY (VIT D DEFICIENCY, FRACTURES): VITD: 11.52 ng/mL — AB (ref 30.00–100.00)

## 2015-04-16 LAB — IBC PANEL
IRON: 28 ug/dL — AB (ref 42–145)
Saturation Ratios: 7.1 % — ABNORMAL LOW (ref 20.0–50.0)
Transferrin: 282 mg/dL (ref 212.0–360.0)

## 2015-04-16 LAB — TSH: TSH: 0.11 u[IU]/mL — ABNORMAL LOW (ref 0.35–4.50)

## 2015-04-16 MED ORDER — BUTALBITAL-APAP-CAFFEINE 50-325-40 MG PO TABS: 1.0000 | ORAL_TABLET | Freq: Four times a day (QID) | ORAL | Status: DC | PRN

## 2015-04-16 MED ORDER — ONDANSETRON HCL 8 MG PO TABS: ORAL_TABLET | ORAL | Status: DC

## 2015-04-16 MED ORDER — DIAZEPAM 10 MG PO TABS: ORAL_TABLET | ORAL | Status: DC

## 2015-04-16 MED ORDER — BACLOFEN 10 MG PO TABS: 10.0000 mg | ORAL_TABLET | Freq: Two times a day (BID) | ORAL | Status: DC

## 2015-04-16 NOTE — Progress Notes (Signed)
Subjective:  Patient ID: Lisa Bruce, female    DOB: May 07, 1965  Age: 50 y.o. MRN: [id number]  CC: No chief complaint on file.   HPI Lisa Bruce presents for lipomas, chronic pain, hypothyroidism, stiffness/tremor f/u  Outpatient Prescriptions Prior to Visit  Medication Sig Dispense Refill  . fentaNYL (DURAGESIC - DOSED MCG/HR) 25 MCG/HR patch Place 25 mcg onto the skin every 3 (three) days.  0  . ferrous sulfate 325 (65 FE) MG tablet Take 1 tablet (325 mg total) by mouth 2 (two) times daily with a meal. 60 tablet 6  . levothyroxine (LEVOTHROID) 137 MCG tablet Take 1 tablet (137 mcg total) by mouth daily before breakfast. 100 tablet 3  . liothyronine (CYTOMEL) 25 MCG tablet Take 12.5 mcg by mouth daily.     . meperidine (DEMEROL) 50 MG tablet Take 1 tablet (50 mg total) by mouth every 4 (four) hours as needed for moderate pain or severe pain. 150 tablet 0  . vitamin B-12 (CYANOCOBALAMIN) 1000 MCG tablet Take 1,000 mcg by mouth daily.    . baclofen (LIORESAL) 10 MG tablet Take 1 tablet (10 mg total) by mouth 2 (two) times daily. 180 tablet 3  . butalbital-acetaminophen-caffeine (FIORICET, ESGIC) 50-325-40 MG tablet TAKE 1 TABLET BY MOUTH AS NEEDED FOR HEADACHE 60 tablet 3  . diazepam (VALIUM) 10 MG tablet 3 at 6 am, 3 at noon, 3 at 5pm and 4 at bedtime 390 tablet 3  . ondansetron (ZOFRAN) 8 MG tablet TAKE 0.5-1 TABLETS (4-8 MG TOTAL) BY MOUTH EVERY 8 (EIGHT) HOURS AS NEEDED FOR NAUSEA OR VOMITING. 20 tablet 3  . clobetasol cream (TEMOVATE) AB-123456789 % Apply 1 application topically 2 (two) times daily. (Patient not taking: Reported on 04/16/2015) 30 g 3  . ergocalciferol (VITAMIN D2) 50000 UNITS capsule Take 1 capsule (50,000 Units total) by mouth every 30 (thirty) days. (Patient not taking: Reported on 04/16/2015) 6 capsule 2  . lactulose (CHRONULAC) 10 GM/15ML solution Take 30-45 mLs (20-30 g total) by mouth 2 (two) times daily as needed for mild constipation. (Patient not taking: Reported  on 04/16/2015) 240 mL 2  . lubiprostone (AMITIZA) 24 MCG capsule Take 1 capsule (24 mcg total) by mouth 2 (two) times daily with a meal. (Patient not taking: Reported on 04/16/2015) 60 capsule 11  . esomeprazole (NEXIUM) 40 MG capsule Take 1 capsule (40 mg total) by mouth daily. (Patient not taking: Reported on 04/16/2015) 30 capsule 3   No facility-administered medications prior to visit.    ROS Review of Systems  Constitutional: Positive for fatigue. Negative for chills, activity change, appetite change and unexpected weight change.  HENT: Negative for congestion, mouth sores and sinus pressure.   Eyes: Negative for visual disturbance.  Respiratory: Negative for cough and chest tightness.   Gastrointestinal: Negative for nausea, abdominal pain and diarrhea.  Genitourinary: Negative for frequency, difficulty urinating and vaginal pain.  Musculoskeletal: Positive for arthralgias and neck stiffness. Negative for back pain and gait problem.  Skin: Negative for pallor and rash.  Neurological: Positive for dizziness, tremors, speech difficulty and weakness. Negative for numbness and headaches.  Hematological: Does not bruise/bleed easily.  Psychiatric/Behavioral: Negative for confusion and sleep disturbance.    Objective:  BP 110/70 mmHg  Pulse 80  Wt 168 lb (76.204 kg)  BP Readings from Last 3 Encounters:  04/16/15 110/70  01/16/15 110/78  10/10/14 118/84    Wt Readings from Last 3 Encounters:  04/16/15 168 lb (76.204 kg)  01/16/15 170 lb (  77.111 kg)  10/10/14 177 lb (80.287 kg)    Physical Exam  Constitutional: She appears well-developed. No distress.  HENT:  Head: Normocephalic.  Right Ear: External ear normal.  Left Ear: External ear normal.  Nose: Nose normal.  Mouth/Throat: Oropharynx is clear and moist.  Eyes: Conjunctivae are normal. Pupils are equal, round, and reactive to light. Right eye exhibits no discharge. Left eye exhibits no discharge.  Neck: Normal range of  motion. Neck supple. No JVD present. No tracheal deviation present. No thyromegaly present.  Cardiovascular: Normal rate, regular rhythm and normal heart sounds.   Pulmonary/Chest: No stridor. No respiratory distress. She has no wheezes.  Abdominal: Soft. Bowel sounds are normal. She exhibits no distension and no mass. There is no tenderness. There is no rebound and no guarding.  Musculoskeletal: She exhibits tenderness. She exhibits no edema.  Lymphadenopathy:    She has no cervical adenopathy.  Neurological: She displays abnormal reflex. No cranial nerve deficit. She exhibits abnormal muscle tone. Coordination abnormal.  Skin: No rash noted. No erythema. No pallor.  Psychiatric: She has a normal mood and affect. Her behavior is normal. Judgment and thought content normal.  lipomas  Lab Results  Component Value Date   WBC 5.1 04/16/2015   HGB 9.6* 04/16/2015   HCT 29.6* 04/16/2015   PLT 387.0 04/16/2015   GLUCOSE 95 04/16/2015   CHOL 269* 05/22/2010   TRIG 129.0 05/22/2010   HDL 63.70 05/22/2010   LDLDIRECT 180.1 05/22/2010   ALT 18 04/16/2015   AST 21 04/16/2015   NA 133* 04/16/2015   K 4.1 04/16/2015   CL 102 04/16/2015   CREATININE 0.86 04/16/2015   BUN 6 04/16/2015   CO2 24 04/16/2015   TSH 0.11* 04/16/2015    US Pelvis Limited  09/04/2014  CLINICAL DATA:  Questionable palpable masses. EXAM: LIMITED ULTRASOUND OF PELVIS TECHNIQUE: Limited transabdominal ultrasound examination of the pelvis was performed. COMPARISON:  PET-CT 10/14/2005. FINDINGS: No cystic or solid abnormalities identified in the regions of clinical concern. No evidence of hernia. IMPRESSION: Negative exam. Electronically Signed   By: Marcello Moores  Register   On: 09/04/2014 10:34    Assessment & Plan:   Diagnoses and all orders for this visit:  Stiffness in joint -     IBC panel; Future -     CBC with Differential/Platelet; Future -     Basic metabolic panel; Future -     Hepatic function panel; Future -      TSH; Future -     Vitamin B12; Future -     VITAMIN D 25 Hydroxy (Vit-D Deficiency, Fractures); Future  Lipoma of abdominal wall -     Ambulatory referral to General Surgery -     IBC panel; Future -     CBC with Differential/Platelet; Future -     Basic metabolic panel; Future -     Hepatic function panel; Future -     TSH; Future -     Vitamin B12; Future -     VITAMIN D 25 Hydroxy (Vit-D Deficiency, Fractures); Future  Hypothyroidism due to acquired atrophy of thyroid -     IBC panel; Future -     CBC with Differential/Platelet; Future -     Basic metabolic panel; Future -     Hepatic function panel; Future -     TSH; Future -     Vitamin B12; Future -     VITAMIN D 25 Hydroxy (Vit-D Deficiency, Fractures); Future  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 -     IBC panel; Future -     CBC with Differential/Platelet; Future -     Basic metabolic panel; Future -     Hepatic function panel; Future -     TSH; Future -     Vitamin B12; Future -     VITAMIN D 25 Hydroxy (Vit-D Deficiency, Fractures); Future  Vitamin B12 deficiency -     IBC panel; Future -     CBC with Differential/Platelet; Future -     Basic metabolic panel; Future -     Hepatic function panel; Future -     TSH; Future -     Vitamin B12; Future -     VITAMIN D 25 Hydroxy (Vit-D Deficiency, Fractures); Future  Vitamin D deficiency -     IBC panel; Future -     CBC with Differential/Platelet; Future -     Basic metabolic panel; Future -     Hepatic function panel; Future -     TSH; Future -     Vitamin B12; Future -     VITAMIN D 25 Hydroxy (Vit-D Deficiency, Fractures); Future  Other orders -     diazepam (VALIUM) 10 MG tablet; 3 at 6 am, 3 at noon, 3 at 5pm and 4 at bedtime -     butalbital-acetaminophen-caffeine (FIORICET, ESGIC) 50-325-40 MG tablet; Take 1 tablet by mouth every 6 (six) hours as needed for headache. -     baclofen (LIORESAL) 10 MG tablet; Take 1 tablet (10 mg total) by mouth 2 (two) times daily. -      ondansetron (ZOFRAN) 8 MG tablet; TAKE 0.5-1 TABLETS (4-8 MG TOTAL) BY MOUTH EVERY 8 (EIGHT) HOURS AS NEEDED FOR NAUSEA OR VOMITING.   I have discontinued Ms. Otterness's esomeprazole. I have also changed her butalbital-acetaminophen-caffeine. Additionally, I am having her maintain her vitamin B-12, liothyronine, meperidine, levothyroxine, lubiprostone, ferrous sulfate, fentaNYL, lactulose, clobetasol cream, ergocalciferol, benzoyl peroxide, diazepam, baclofen, and ondansetron.  Meds ordered this encounter  Medications  . benzoyl peroxide 10 % gel    Sig: Apply topically at bedtime.  . diazepam (VALIUM) 10 MG tablet    Sig: 3 at 6 am, 3 at noon, 3 at 5pm and 4 at bedtime    Dispense:  390 tablet    Refill:  3  . butalbital-acetaminophen-caffeine (FIORICET, ESGIC) 50-325-40 MG tablet    Sig: Take 1 tablet by mouth every 6 (six) hours as needed for headache.    Dispense:  60 tablet    Refill:  3  . baclofen (LIORESAL) 10 MG tablet    Sig: Take 1 tablet (10 mg total) by mouth 2 (two) times daily.    Dispense:  180 tablet    Refill:  3  . ondansetron (ZOFRAN) 8 MG tablet    Sig: TAKE 0.5-1 TABLETS (4-8 MG TOTAL) BY MOUTH EVERY 8 (EIGHT) HOURS AS NEEDED FOR NAUSEA OR VOMITING.    Dispense:  20 tablet    Refill:  3     Follow-up: Return in about 3 months (around 07/14/2015) for a follow-up visit.  Walker Kehr, MD

## 2015-04-16 NOTE — Patient Instructions (Signed)
Foods rich in iron include:  Red meat, pork and poultry.  Seafood.  Beans.  Dark green leafy vegetables, such as spinach.  Dried fruit, such as raisins and apricots.  Iron-fortified cereals, breads and pastas.  Peas.

## 2015-04-16 NOTE — Assessment & Plan Note (Signed)
On B12 

## 2015-04-16 NOTE — Assessment & Plan Note (Signed)
        Will reschedule w/Dr Dalbert Batman

## 2015-04-16 NOTE — Assessment & Plan Note (Signed)
On Vit D 

## 2015-04-16 NOTE — Assessment & Plan Note (Signed)
On Levothroid 

## 2015-04-16 NOTE — Progress Notes (Signed)
Pre visit review using our clinic review tool, if applicable. No additional management support is needed unless otherwise documented below in the visit note. 

## 2015-05-03 ENCOUNTER — Encounter: Payer: Self-pay | Admitting: Internal Medicine

## 2015-05-10 ENCOUNTER — Other Ambulatory Visit: Payer: Self-pay | Admitting: General Surgery

## 2015-05-10 DIAGNOSIS — R222 Localized swelling, mass and lump, trunk: Secondary | ICD-10-CM

## 2015-05-14 ENCOUNTER — Ambulatory Visit
Admission: RE | Admit: 2015-05-14 | Discharge: 2015-05-14 | Disposition: A | Discharge status: Home / Self Care | Payer: 59 | Source: Ambulatory Visit | Attending: General Surgery | Admitting: General Surgery

## 2015-05-14 DIAGNOSIS — R222 Localized swelling, mass and lump, trunk: Secondary | ICD-10-CM

## 2015-05-14 MED ORDER — GADOBENATE DIMEGLUMINE 529 MG/ML IV SOLN: 16.0000 mL | Freq: Once | INTRAVENOUS | Status: DC | PRN

## 2015-05-15 ENCOUNTER — Other Ambulatory Visit: Payer: Self-pay | Admitting: General Surgery

## 2015-05-15 DIAGNOSIS — D171 Benign lipomatous neoplasm of skin and subcutaneous tissue of trunk: Secondary | ICD-10-CM

## 2015-05-29 ENCOUNTER — Other Ambulatory Visit: Payer: Self-pay | Admitting: Internal Medicine

## 2015-06-03 MED ORDER — DIAZEPAM 10 MG PO TABS: ORAL_TABLET | ORAL | Status: DC

## 2015-06-03 NOTE — Telephone Encounter (Signed)
Rx faxed to pharmacy  

## 2015-06-03 NOTE — Addendum Note (Signed)
Addended by: BONYUN-DEBOURGH, Rajveer Handler M on: 06/03/2015 12:39 PM   Modules accepted: Orders  

## 2015-07-11 ENCOUNTER — Other Ambulatory Visit: Payer: Self-pay | Admitting: Internal Medicine

## 2015-07-23 ENCOUNTER — Encounter: Payer: Self-pay | Admitting: Internal Medicine

## 2015-07-23 ENCOUNTER — Ambulatory Visit (INDEPENDENT_AMBULATORY_CARE_PROVIDER_SITE_OTHER): Payer: 59 | Admitting: Internal Medicine

## 2015-07-23 DIAGNOSIS — E538 Deficiency of other specified B group vitamins: Secondary | ICD-10-CM

## 2015-07-23 DIAGNOSIS — D171 Benign lipomatous neoplasm of skin and subcutaneous tissue of trunk: Secondary | ICD-10-CM | POA: Diagnosis not present

## 2015-07-23 DIAGNOSIS — G2582 Stiff-man syndrome: Secondary | ICD-10-CM

## 2015-07-23 DIAGNOSIS — E559 Vitamin D deficiency, unspecified: Secondary | ICD-10-CM

## 2015-07-23 DIAGNOSIS — B351 Tinea unguium: Secondary | ICD-10-CM

## 2015-07-23 MED ORDER — CICLOPIROX 8 % EX SOLN: Freq: Every day | CUTANEOUS | Status: DC

## 2015-07-23 MED ORDER — ONDANSETRON HCL 8 MG PO TABS: ORAL_TABLET | ORAL | Status: DC

## 2015-07-23 MED ORDER — VITAMIN D3 50 MCG (2000 UT) PO CAPS: 2000.0000 [IU] | ORAL_CAPSULE | Freq: Every day | ORAL | Status: DC

## 2015-07-23 MED ORDER — DIAZEPAM 10 MG PO TABS: ORAL_TABLET | ORAL | Status: DC

## 2015-07-23 MED ORDER — BUTALBITAL-APAP-CAFFEINE 50-325-40 MG PO TABS: 1.0000 | ORAL_TABLET | Freq: Four times a day (QID) | ORAL | Status: DC | PRN

## 2015-07-23 MED ORDER — BACLOFEN 10 MG PO TABS: 10.0000 mg | ORAL_TABLET | Freq: Two times a day (BID) | ORAL | Status: DC

## 2015-07-23 NOTE — Progress Notes (Signed)
Subjective:  Patient ID: Lisa Bruce, female    DOB: Nov 27, 1965  Age: 50 y.o. MRN: [id number]  CC: No chief complaint on file.   HPI ALANNIS HOTOP presents for dystonia, pain, spasms, anxiety f/u  Outpatient Prescriptions Prior to Visit  Medication Sig Dispense Refill  . baclofen (LIORESAL) 10 MG tablet Take 1 tablet (10 mg total) by mouth 2 (two) times daily. 180 tablet 3  . benzoyl peroxide 10 % gel Apply topically at bedtime.    . butalbital-acetaminophen-caffeine (FIORICET, ESGIC) 50-325-40 MG tablet Take 1 tablet by mouth every 6 (six) hours as needed for headache. 60 tablet 3  . clobetasol cream (TEMOVATE) AB-123456789 % Apply 1 application topically 2 (two) times daily. 30 g 3  . diazepam (VALIUM) 10 MG tablet TAKE 3 TABLETS AT 6 AM, 3 TABLETS AT NOON, 3 TABS AT 5PM AND 4 TABS AT BEDTIME 390 tablet 1  . ergocalciferol (VITAMIN D2) 50000 UNITS capsule Take 1 capsule (50,000 Units total) by mouth every 30 (thirty) days. 6 capsule 2  . fentaNYL (DURAGESIC - DOSED MCG/HR) 25 MCG/HR patch Place 25 mcg onto the skin every 3 (three) days.  0  . ferrous sulfate 325 (65 FE) MG tablet Take 1 tablet (325 mg total) by mouth 2 (two) times daily with a meal. 60 tablet 6  . lactulose (CHRONULAC) 10 GM/15ML solution Take 30-45 mLs (20-30 g total) by mouth 2 (two) times daily as needed for mild constipation. 240 mL 2  . levothyroxine (LEVOTHROID) 137 MCG tablet Take 1 tablet (137 mcg total) by mouth daily before breakfast. 100 tablet 3  . liothyronine (CYTOMEL) 25 MCG tablet Take 12.5 mcg by mouth daily.     [name] lubiprostone (AMITIZA) 24 MCG capsule Take 1 capsule (24 mcg total) by mouth 2 (two) times daily with a meal. 60 capsule 11  . meperidine (DEMEROL) 50 MG tablet Take 1 tablet (50 mg total) by mouth every 4 (four) hours as needed for moderate pain or severe pain. 150 tablet 0  . ondansetron (ZOFRAN) 8 MG tablet TAKE 0.5-1 TABLETS (4-8 MG TOTAL) BY MOUTH EVERY 8 (EIGHT) HOURS AS NEEDED FOR NAUSEA  OR VOMITING. 20 tablet 3  . vitamin B-12 (CYANOCOBALAMIN) 1000 MCG tablet Take 1,000 mcg by mouth daily.     No facility-administered medications prior to visit.    ROS Review of Systems  Constitutional: Negative for chills, activity change, appetite change, fatigue and unexpected weight change.  HENT: Negative for congestion, mouth sores and sinus pressure.   Eyes: Negative for visual disturbance.  Respiratory: Negative for cough and chest tightness.   Gastrointestinal: Negative for nausea and abdominal pain.  Genitourinary: Negative for frequency, difficulty urinating and vaginal pain.  Musculoskeletal: Positive for myalgias, back pain, arthralgias, gait problem, neck pain and neck stiffness.  Skin: Negative for pallor and rash.  Neurological: Positive for tremors. Negative for dizziness, weakness, numbness and headaches.  Psychiatric/Behavioral: Negative for confusion and sleep disturbance. The patient is nervous/anxious.     Objective:  There were no vitals taken for this visit.  BP Readings from Last 3 Encounters:  04/16/15 110/70  01/16/15 110/78  10/10/14 118/84    Wt Readings from Last 3 Encounters:  04/16/15 168 lb (76.204 kg)  01/16/15 170 lb (77.111 kg)  10/10/14 177 lb (80.287 kg)    Physical Exam  Constitutional: She appears well-developed. No distress.  HENT:  Head: Normocephalic.  Right Ear: External ear normal.  Left Ear: External ear normal.  Nose:  Nose normal.  Mouth/Throat: Oropharynx is clear and moist.  Eyes: Conjunctivae are normal. Pupils are equal, round, and reactive to light. Right eye exhibits no discharge. Left eye exhibits no discharge.  Neck: Normal range of motion. Neck supple. No JVD present. No tracheal deviation present. No thyromegaly present.  Cardiovascular: Normal rate, regular rhythm and normal heart sounds.   Pulmonary/Chest: No stridor. No respiratory distress. She has no wheezes.  Abdominal: Soft. Bowel sounds are normal. She  exhibits no distension and no mass. There is no tenderness. There is no rebound and no guarding.  Musculoskeletal: She exhibits no edema or tenderness.  Lymphadenopathy:    She has no cervical adenopathy.  Neurological: She displays abnormal reflex. No cranial nerve deficit. She exhibits abnormal muscle tone. Coordination abnormal.  Skin: No rash noted. No erythema.  Psychiatric: Her behavior is normal. Judgment and thought content normal.   Onycho B big toes Lab Results  Component Value Date   WBC 5.1 04/16/2015   HGB 9.6* 04/16/2015   HCT 29.6* 04/16/2015   PLT 387.0 04/16/2015   GLUCOSE 95 04/16/2015   CHOL 269* 05/22/2010   TRIG 129.0 05/22/2010   HDL 63.70 05/22/2010   LDLDIRECT 180.1 05/22/2010   ALT 18 04/16/2015   AST 21 04/16/2015   NA 133* 04/16/2015   K 4.1 04/16/2015   CL 102 04/16/2015   CREATININE 0.86 04/16/2015   BUN 6 04/16/2015   CO2 24 04/16/2015   TSH 0.11* 04/16/2015    Mr Abdomen W Wo Contrast  05/14/2015  CLINICAL DATA:  Abdominal pain with bloating and discomfort. There is a diagnosis of anterior abdominal wall mass in EPIC although it is unclear whether this is an indication for today's MRI. EXAM: MRI ABDOMEN WITHOUT AND WITH CONTRAST TECHNIQUE: Multiplanar multisequence MR imaging of the abdomen was performed both before and after the administration of intravenous contrast. CONTRAST:  16 cc MultiHance COMPARISON:  PET-CT from 10/14/2005 FINDINGS: Lower chest:  Unremarkable. Hepatobiliary: No focal abnormality within the liver parenchyma. Extrahepatic common duct measures up to 8 mm in diameter in this patient status post cholecystectomy. Mild associated prominence of the left intrahepatic biliary ducts noted. The common bile duct in the head of the pancreas is 6 mm diameter. Pancreas: No focal mass lesion. No dilatation of the main duct. No intraparenchymal cyst. No peripancreatic edema. Spleen: No splenomegaly. No focal mass lesion. Adrenals/Urinary Tract:  No adrenal nodule or mass. Kidneys have normal imaging features bilaterally. Stomach/Bowel: Stomach is nondistended. No gastric wall thickening. No evidence of outlet obstruction. Duodenum is normally positioned as is the ligament of Treitz. Visualize small bowel loops and colonic segments of the abdomen are normal in appearance. Vascular/Lymphatic: No abdominal aortic aneurysm. No evidence for lymphadenopathy in the abdomen. Other: No intraperitoneal free fluid. Musculoskeletal: No abnormal marrow signal within the visualized bony anatomy. No evidence for anterior abdominal wall mass from the xiphoid process to umbilicus. IMPRESSION: Slight prominence of the left intrahepatic biliary ducts with mild distention of the extrahepatic common duct. These changes may be related to prior cholecystectomy, but correlation with liver function tests recommended. No evidence of anterior abdominal wall mass between the level of the xiphoid process and the umbilicus. Otherwise unremarkable exam with no features to explain the patient's history of pain and bloating. Electronically Signed   By: Misty Stanley M.D.   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 Dano maintain her vitamin B-12, liothyronine, meperidine, levothyroxine, lubiprostone, ferrous sulfate, fentaNYL, lactulose, clobetasol cream, ergocalciferol, benzoyl peroxide, butalbital-acetaminophen-caffeine, baclofen, diazepam, and ondansetron.  No orders of the defined types were placed in this encounter.     Follow-up: No Follow-up on file.  Walker Kehr, MD

## 2015-07-23 NOTE — Assessment & Plan Note (Signed)
On B12 

## 2015-07-23 NOTE — Progress Notes (Signed)
Pre visit review using our clinic review tool, if applicable. No additional management support is needed unless otherwise documented below in the visit note. 

## 2015-07-23 NOTE — Assessment & Plan Note (Signed)
6/17 B big toes - start [place]

## 2015-07-23 NOTE — Assessment & Plan Note (Signed)
On Vit D 

## 2015-07-23 NOTE — Assessment & Plan Note (Addendum)
Diazepam, Baclofen prn  Potential benefits of a long term benzodiazepines  use as well as potential risks  and complications were explained to the patient and were aknowledged.  [facility] - on Fentanyl patch  Potential benefits of a long term opioids and Benzo use as well as potential risks (i.e. addiction risk, apnea etc) and complications (i.e. Somnolence, constipation and others) were explained to the patient and were aknowledged.

## 2015-07-24 ENCOUNTER — Encounter: Payer: Self-pay | Admitting: Internal Medicine

## 2015-10-31 ENCOUNTER — Other Ambulatory Visit: Payer: Self-pay | Admitting: Internal Medicine

## 2015-11-18 ENCOUNTER — Other Ambulatory Visit: Payer: Self-pay | Admitting: Internal Medicine

## 2015-11-19 NOTE — Telephone Encounter (Signed)
Refill called into pharmacy spoke w/Kathy gave md approval.../lmb

## 2015-11-27 ENCOUNTER — Ambulatory Visit (INDEPENDENT_AMBULATORY_CARE_PROVIDER_SITE_OTHER): Payer: 59 | Admitting: Internal Medicine

## 2015-11-27 ENCOUNTER — Encounter: Payer: Self-pay | Admitting: Internal Medicine

## 2015-11-27 DIAGNOSIS — D508 Other iron deficiency anemias: Secondary | ICD-10-CM

## 2015-11-27 DIAGNOSIS — E034 Atrophy of thyroid (acquired): Secondary | ICD-10-CM

## 2015-11-27 DIAGNOSIS — K5901 Slow transit constipation: Secondary | ICD-10-CM | POA: Diagnosis not present

## 2015-11-27 DIAGNOSIS — E538 Deficiency of other specified B group vitamins: Secondary | ICD-10-CM

## 2015-11-27 DIAGNOSIS — R600 Localized edema: Secondary | ICD-10-CM

## 2015-11-27 DIAGNOSIS — E559 Vitamin D deficiency, unspecified: Secondary | ICD-10-CM

## 2015-11-27 DIAGNOSIS — G894 Chronic pain syndrome: Secondary | ICD-10-CM

## 2015-11-27 DIAGNOSIS — Z23 Encounter for immunization: Secondary | ICD-10-CM | POA: Diagnosis not present

## 2015-11-27 MED ORDER — ONDANSETRON HCL 8 MG PO TABS: ORAL_TABLET | ORAL | 3 refills | Status: DC

## 2015-11-27 MED ORDER — FUROSEMIDE 20 MG PO TABS: 20.0000 mg | ORAL_TABLET | Freq: Every day | ORAL | 3 refills | Status: DC | PRN

## 2015-11-27 MED ORDER — BUTALBITAL-APAP-CAFFEINE 50-325-40 MG PO TABS: 1.0000 | ORAL_TABLET | Freq: Four times a day (QID) | ORAL | 3 refills | Status: DC | PRN

## 2015-11-27 MED ORDER — DIAZEPAM 10 MG PO TABS: ORAL_TABLET | ORAL | 2 refills | Status: DC

## 2015-11-27 MED ORDER — BACLOFEN 10 MG PO TABS: 10.0000 mg | ORAL_TABLET | Freq: Two times a day (BID) | ORAL | 3 refills | Status: DC

## 2015-11-27 MED ORDER — POTASSIUM CHLORIDE CRYS ER 20 MEQ PO TBCR: 20.0000 meq | EXTENDED_RELEASE_TABLET | Freq: Every day | ORAL | 3 refills | Status: DC | PRN

## 2015-11-27 NOTE — Assessment & Plan Note (Signed)
Discussed.

## 2015-11-27 NOTE — Assessment & Plan Note (Signed)
On Vit D 

## 2015-11-27 NOTE — Assessment & Plan Note (Signed)
On Levothroid 

## 2015-11-27 NOTE — Progress Notes (Signed)
Subjective:  Patient ID: Lisa Bruce, female    DOB: 05-09-1965  Age: 50 y.o. MRN: [id number]  CC: No chief complaint on file.   HPI Lisa Bruce presents for stiffness, tremors, anxiety, spasms f/u. C/o leg edema with riding in the car, standing  Outpatient Medications Prior to Visit  Medication Sig Dispense Refill  . baclofen (LIORESAL) 10 MG tablet Take 1 tablet (10 mg total) by mouth 2 (two) times daily. 180 tablet 3  . benzoyl peroxide 10 % gel Apply topically at bedtime.    . butalbital-acetaminophen-caffeine (FIORICET, ESGIC) 50-325-40 MG tablet Take 1 tablet by mouth every 6 (six) hours as needed for headache. 60 tablet 3  . Cholecalciferol (VITAMIN D3) 2000 units capsule Take 1 capsule (2,000 Units total) by mouth daily. 100 capsule 3  . ciclopirox (PENLAC) 8 % solution Apply topically at bedtime. Apply over nail and surrounding skin. Apply daily over previous coat. After seven (7) days, may remove with alcohol and continue cycle. 6.6 mL 0  . diazepam (VALIUM) 10 MG tablet TAKE 3 TABLETS AT 6 AM, 3 TABLETS AT NOON, 3 TABLETS AT 5 PM, AND 4 TABLETS AT BEDTIME 390 tablet 2  . levothyroxine (LEVOTHROID) 137 MCG tablet Take 1 tablet (137 mcg total) by mouth daily before breakfast. 100 tablet 3  . liothyronine (CYTOMEL) 25 MCG tablet Take 12.5 mcg by mouth daily.     . meperidine (DEMEROL) 50 MG tablet Take 1 tablet (50 mg total) by mouth every 4 (four) hours as needed for moderate pain or severe pain. 150 tablet 0  . ondansetron (ZOFRAN) 8 MG tablet TAKE 0.5-1 TABLETS (4-8 MG TOTAL) BY MOUTH EVERY 8 (EIGHT) HOURS AS NEEDED FOR NAUSEA OR VOMITING. 20 tablet 3  . vitamin B-12 (CYANOCOBALAMIN) 1000 MCG tablet Take 1,000 mcg by mouth daily.    . ondansetron (ZOFRAN) 8 MG tablet TAKE 0.5-1 TABLETS (4-8 MG TOTAL) BY MOUTH EVERY 8 (EIGHT) HOURS AS NEEDED FOR NAUSEA OR VOMITING. 20 tablet 3  . clobetasol cream (TEMOVATE) [id number] % Apply 1 application topically 2 (two) times daily. (Patient  not taking: Reported on 11/27/2015) 30 g 3  . fentaNYL (DURAGESIC - DOSED MCG/HR) 25 MCG/HR patch Place 25 mcg onto the skin every 3 (three) days.  0  . ferrous sulfate 325 (65 FE) MG tablet Take 1 tablet (325 mg total) by mouth 2 (two) times daily with a meal. (Patient not taking: Reported on 11/27/2015) 60 tablet 6  . lactulose (CHRONULAC) 10 GM/15ML solution Take 30-45 mLs (20-30 g total) by mouth 2 (two) times daily as needed for mild constipation. (Patient not taking: Reported on 11/27/2015) 240 mL 2  . lubiprostone (AMITIZA) 24 MCG capsule Take 1 capsule (24 mcg total) by mouth 2 (two) times daily with a meal. (Patient not taking: Reported on 11/27/2015) 60 capsule 11   No facility-administered medications prior to visit.     ROS Review of Systems  Constitutional: Negative for activity change, appetite change, chills, fatigue and unexpected weight change.  HENT: Negative for congestion, mouth sores and sinus pressure.   Eyes: Negative for visual disturbance.  Respiratory: Negative for cough and chest tightness.   Cardiovascular: Positive for leg swelling.  Gastrointestinal: Negative for abdominal pain and nausea.  Genitourinary: Negative for difficulty urinating, frequency and vaginal pain.  Musculoskeletal: Positive for arthralgias, gait problem, myalgias and neck stiffness. Negative for back pain.  Skin: Negative for pallor and rash.  Neurological: Positive for dizziness and weakness. Negative for tremors,  numbness and headaches.  Psychiatric/Behavioral: Positive for decreased concentration and sleep disturbance. Negative for confusion. The patient is nervous/anxious.     Objective:  BP 118/82   Pulse 80   Temp 98.6 F (37 C) (Oral)   Wt 172 lb (78 kg)   BMI 27.76 kg/m   BP Readings from Last 3 Encounters:  11/27/15 118/82  04/16/15 110/70  01/16/15 110/78    Wt Readings from Last 3 Encounters:  11/27/15 172 lb (78 kg)  04/16/15 168 lb (76.2 kg)  01/16/15 170 lb  (77.1 kg)    Physical Exam  Constitutional: She appears well-developed. No distress.  HENT:  Head: Normocephalic.  Right Ear: External ear normal.  Left Ear: External ear normal.  Nose: Nose normal.  Mouth/Throat: Oropharynx is clear and moist.  Eyes: Conjunctivae are normal. Pupils are equal, round, and reactive to light. Right eye exhibits no discharge. Left eye exhibits no discharge.  Neck: Normal range of motion. Neck supple. No JVD present. No tracheal deviation present. No thyromegaly present.  Cardiovascular: Normal rate, regular rhythm and normal heart sounds.   Pulmonary/Chest: No stridor. No respiratory distress. She has no wheezes.  Abdominal: Soft. Bowel sounds are normal. She exhibits no distension and no mass. There is no tenderness. There is no rebound and no guarding.  Musculoskeletal: She exhibits edema and tenderness.  Lymphadenopathy:    She has no cervical adenopathy.  Neurological: She displays abnormal reflex. No cranial nerve deficit. She exhibits abnormal muscle tone. Coordination abnormal.  Skin: No rash noted. No erythema.  Psychiatric: Her behavior is normal. Judgment and thought content normal.  trace edema B Spastic; tremor in B UEs  Lab Results  Component Value Date   WBC 5.1 04/16/2015   HGB 9.6 (L) 04/16/2015   HCT 29.6 (L) 04/16/2015   PLT 387.0 04/16/2015   GLUCOSE 95 04/16/2015   CHOL 269 (H) 05/22/2010   TRIG 129.0 05/22/2010   HDL 63.70 05/22/2010   LDLDIRECT 180.1 05/22/2010   ALT 18 04/16/2015   AST 21 04/16/2015   NA 133 (L) 04/16/2015   K 4.1 04/16/2015   CL 102 04/16/2015   CREATININE 0.86 04/16/2015   BUN 6 04/16/2015   CO2 24 04/16/2015   TSH 0.11 (L) 04/16/2015    Mr Abdomen W Wo Contrast  Result Date: 05/14/2015 CLINICAL DATA:  Abdominal pain with bloating and discomfort. There is a diagnosis of anterior abdominal wall mass in EPIC although it is unclear whether this is an indication for today's MRI. EXAM: MRI ABDOMEN  WITHOUT AND WITH CONTRAST TECHNIQUE: Multiplanar multisequence MR imaging of the abdomen was performed both before and after the administration of intravenous contrast. CONTRAST:  16 cc MultiHance COMPARISON:  PET-CT from 10/14/2005 FINDINGS: Lower chest:  Unremarkable. Hepatobiliary: No focal abnormality within the liver parenchyma. Extrahepatic common duct measures up to 8 mm in diameter in this patient status post cholecystectomy. Mild associated prominence of the left intrahepatic biliary ducts noted. The common bile duct in the head of the pancreas is 6 mm diameter. Pancreas: No focal mass lesion. No dilatation of the main duct. No intraparenchymal cyst. No peripancreatic edema. Spleen: No splenomegaly. No focal mass lesion. Adrenals/Urinary Tract: No adrenal nodule or mass. Kidneys have normal imaging features bilaterally. Stomach/Bowel: Stomach is nondistended. No gastric wall thickening. No evidence of outlet obstruction. Duodenum is normally positioned as is the ligament of Treitz. Visualize small bowel loops and colonic segments of the abdomen are normal in appearance. Vascular/Lymphatic: No abdominal aortic aneurysm.  No evidence for lymphadenopathy in the abdomen. Other: No intraperitoneal free fluid. Musculoskeletal: No abnormal marrow signal within the visualized bony anatomy. No evidence for anterior abdominal wall mass from the xiphoid process to umbilicus. IMPRESSION: Slight prominence of the left intrahepatic biliary ducts with mild distention of the extrahepatic common duct. These changes may be related to prior cholecystectomy, but correlation with liver function tests recommended. No evidence of anterior abdominal wall mass between the level of the xiphoid process and the umbilicus. Otherwise unremarkable exam with no features to explain the patient's history of pain and bloating. Electronically Signed   By: Misty Stanley M.D.   On: 05/14/2015 16:27    Assessment & Plan:   There are no  diagnoses linked to this encounter. I am having Ms. Follansbee maintain her vitamin B-12, liothyronine, meperidine, levothyroxine, lubiprostone, ferrous sulfate, fentaNYL, lactulose, clobetasol cream, benzoyl peroxide, Vitamin D3, ciclopirox, ondansetron, baclofen, butalbital-acetaminophen-caffeine, diazepam, and morphine.  Meds ordered this encounter  Medications  . morphine (MS CONTIN) 30 MG 12 hr tablet    Sig: Take 1 tablet by mouth every 8 (eight) hours as needed.    Refill:  0     Follow-up: No Follow-up on file.  Walker Kehr, MD

## 2015-11-27 NOTE — Progress Notes (Signed)
Pre visit review using our clinic review tool, if applicable. No additional management support is needed unless otherwise documented below in the visit note. 

## 2015-11-27 NOTE — Assessment & Plan Note (Signed)
Dr Hardin Negus MS conting Demerol Valium

## 2015-11-27 NOTE — Addendum Note (Signed)
Addended by: Cresenciano Lick on: 11/27/2015 05:11 PM   Modules accepted: Orders

## 2015-11-27 NOTE — Assessment & Plan Note (Signed)
Lasix prn K dur

## 2015-11-27 NOTE — Patient Instructions (Signed)
Use compression knee highs for travel 

## 2015-11-27 NOTE — Assessment & Plan Note (Signed)
Intolerant of po iron IV iron adviced

## 2015-11-27 NOTE — Assessment & Plan Note (Signed)
On B12 

## 2016-01-02 ENCOUNTER — Encounter: Payer: Self-pay | Admitting: Hematology & Oncology

## 2016-01-06 ENCOUNTER — Other Ambulatory Visit: Payer: Self-pay | Admitting: Family

## 2016-01-06 ENCOUNTER — Ambulatory Visit: Payer: 59

## 2016-01-06 ENCOUNTER — Ambulatory Visit (HOSPITAL_BASED_OUTPATIENT_CLINIC_OR_DEPARTMENT_OTHER): Payer: 59 | Admitting: Family

## 2016-01-06 ENCOUNTER — Encounter: Payer: Self-pay | Admitting: Family

## 2016-01-06 ENCOUNTER — Other Ambulatory Visit (HOSPITAL_BASED_OUTPATIENT_CLINIC_OR_DEPARTMENT_OTHER): Payer: 59

## 2016-01-06 VITALS — BP 118/66 | HR 72 | Temp 98.0°F | Resp 20 | Ht 66.5 in | Wt 170.4 lb

## 2016-01-06 DIAGNOSIS — E559 Vitamin D deficiency, unspecified: Secondary | ICD-10-CM

## 2016-01-06 DIAGNOSIS — G2582 Stiff-man syndrome: Secondary | ICD-10-CM

## 2016-01-06 DIAGNOSIS — D509 Iron deficiency anemia, unspecified: Secondary | ICD-10-CM

## 2016-01-06 DIAGNOSIS — D508 Other iron deficiency anemias: Secondary | ICD-10-CM

## 2016-01-06 DIAGNOSIS — D510 Vitamin B12 deficiency anemia due to intrinsic factor deficiency: Secondary | ICD-10-CM

## 2016-01-06 LAB — COMPREHENSIVE METABOLIC PANEL
ALBUMIN: 3.7 g/dL (ref 3.5–5.0)
ALK PHOS: 109 U/L (ref 40–150)
ALT: 23 U/L (ref 0–55)
AST: 27 U/L (ref 5–34)
Anion Gap: 11 mEq/L (ref 3–11)
BUN: 6.5 mg/dL — AB (ref 7.0–26.0)
CALCIUM: 9 mg/dL (ref 8.4–10.4)
CO2: 19 mEq/L — ABNORMAL LOW (ref 22–29)
CREATININE: 0.8 mg/dL (ref 0.6–1.1)
Chloride: 100 mEq/L (ref 98–109)
EGFR: 81 mL/min/{1.73_m2} — ABNORMAL LOW (ref >90)
Glucose: 75 mg/dl (ref 70–140)
Potassium: 4.3 mEq/L (ref 3.5–5.1)
Sodium: 131 mEq/L — ABNORMAL LOW (ref 136–145)
Total Bilirubin: <0.22 mg/dL (ref 0.20–1.20)
Total Protein: 7.8 g/dL (ref 6.4–8.3)

## 2016-01-06 LAB — IRON AND TIBC
%SAT: 8 % — ABNORMAL LOW (ref 21–57)
Iron: 28 ug/dL — ABNORMAL LOW (ref 41–142)
TIBC: 343 ug/dL (ref 236–444)
UIBC: 315 ug/dL (ref 120–384)

## 2016-01-06 LAB — CBC WITH DIFFERENTIAL (CANCER CENTER ONLY)
BASO#: 0 10*3/uL (ref 0.0–0.2)
BASO%: 0.6 % (ref 0.0–2.0)
EOS%: 2.1 % (ref 0.0–7.0)
Eosinophils Absolute: 0.1 10*3/uL (ref 0.0–0.5)
HEMATOCRIT: 28.5 % — AB (ref 34.8–46.6)
HEMOGLOBIN: 9.3 g/dL — AB (ref 11.6–15.9)
LYMPH#: 2.6 10*3/uL (ref 0.9–3.3)
LYMPH%: 49.2 % — ABNORMAL HIGH (ref 14.0–48.0)
MCH: 24.5 pg — ABNORMAL LOW (ref 26.0–34.0)
MCHC: 32.6 g/dL (ref 32.0–36.0)
MCV: 75 fL — ABNORMAL LOW (ref 81–101)
MONO#: 0.5 10*3/uL (ref 0.1–0.9)
MONO%: 9.8 % (ref 0.0–13.0)
NEUT%: 38.3 % — ABNORMAL LOW (ref 39.6–80.0)
NEUTROS ABS: 2 10*3/uL (ref 1.5–6.5)
Platelets: 375 10*3/uL (ref 145–400)
RBC: 3.79 10*6/uL (ref 3.70–5.32)
RDW: 16.4 % — ABNORMAL HIGH (ref 11.1–15.7)
WBC: 5.2 10*3/uL (ref 3.9–10.0)

## 2016-01-06 LAB — CHCC SATELLITE - SMEAR

## 2016-01-06 LAB — FERRITIN: Ferritin: 7 ng/ml — ABNORMAL LOW (ref 9–269)

## 2016-01-06 MED ORDER — NALOXEGOL OXALATE 25 MG PO TABS: 25.0000 mg | ORAL_TABLET | Freq: Every day | ORAL | 1 refills | Status: DC

## 2016-01-06 NOTE — Progress Notes (Signed)
Hematology/Oncology Consultation   Name: Lisa Bruce      MRN: [id number]    Location: Room/bed info not found  Date: 01/06/2016 Time:10:45 AM   REFERRING PHYSICIAN: Nicholaus Bloom, MD  REASON FOR CONSULT: Iron deficiency anemia    DIAGNOSIS: Iron deficiency anemia   HISTORY OF PRESENT ILLNESS: Lisa Bruce is a very pleasant 50 yo white female with history of iron deficiency anemia over the last year.  She is symptomatic at this time with fatigue, weakness, SOB with any exertion, chewing ice and dizziness (with history of vertigo).  Hgb today is 9.3 with an MCV of 75.  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 She takes multiple medication for which has lead to her developing chronic constipation and abdominal discomfort. She was unable to tolerate oral iron because if this.  No family history of iron deficiency anemia.  She has a great deal of arm, leg and back pain with the muscle contraction and spasms.  She still has her cycle but is this is light. She is regular lasting 1-2 days but does spot some in between.  She has two healthy children and no history of miscarriage.  She has a history of Hashimoto's when she was 14 and treated at [facility] for over a year. She is now on synthroid and cytomel daily and followed by endocrinology.  She states that she has had her mammogram and it was negative. She is unable to have a colonoscopy because she can not tolerate the cleanse part due to her chronic constipation.  He father passed away when she was 65 years old from malignant melanoma. She has had multiple basal cell skin cancer removed. Her skin is very fair so she wears her sunscreen and stays out of the sun.  No fever, chills, n/v, cough, rash, chest pain, palpitations or changes in bowel or bladder habits.  No swelling, tenderness, numbness or tingling in her extremities. Her upper extremities are contracted and she has a significant tremor.  She has maintained a good appetite and is able  to stay hydrated. Her weight is stable.   ROS: All other 10 point review of systems is negative.   PAST MEDICAL HISTORY:   Past Medical History:  Diagnosis Date  . Anemia 09/02/2014  . Anxiety   . Basal cell carcinoma    Multiple  . Neuromuscular disorder ([place])   . Thyroid disease     ALLERGIES: Allergies  Allergen Reactions  . Erythromycin Rash  . Penicillins Rash  . Codeine Nausea And Vomiting  . Fentanyl     vomiting  . Gabapentin Other (See Comments)    Sick,loopy  . Imuran [Azathioprine Sodium]     Nausea, chills  . Lyrica [Pregabalin]     loopy  . Methotrexate Derivatives     vomiting  . Tizanidine Other (See Comments)    Leg pain  . Trazodone And Nefazodone Nausea And Vomiting  . Zanaflex [Tizanidine Hcl]     weak      MEDICATIONS:  Current Outpatient Prescriptions on File Prior to Visit  Medication Sig Dispense Refill  . baclofen (LIORESAL) 10 MG tablet Take 1 tablet (10 mg total) by mouth 2 (two) times daily. 180 tablet 3  . benzoyl peroxide 10 % gel Apply topically at bedtime.    . butalbital-acetaminophen-caffeine (FIORICET, ESGIC) 50-325-40 MG tablet Take 1 tablet by mouth every 6 (six) hours as needed for headache. 60 tablet 3  . ciclopirox (PENLAC) 8 % solution Apply topically at bedtime.  Apply over nail and surrounding skin. Apply daily over previous coat. After seven (7) days, may remove with alcohol and continue cycle. 6.6 mL 0  . diazepam (VALIUM) 10 MG tablet TAKE 3 TABLETS AT 6 AM, 3 TABLETS AT NOON, 3 TABLETS AT 5 PM, AND 4 TABLETS AT BEDTIME 390 tablet 2  . furosemide (LASIX) 20 MG tablet Take 1-2 tablets (20-40 mg total) by mouth daily as needed. 60 tablet 3  . levothyroxine (LEVOTHROID) 137 MCG tablet Take 1 tablet (137 mcg total) by mouth daily before breakfast. 100 tablet 3  . liothyronine (CYTOMEL) 25 MCG tablet Take 12.5 mcg by mouth daily.     . meperidine (DEMEROL) 50 MG tablet Take 1 tablet (50 mg total) by mouth every 4 (four) hours as  needed for moderate pain or severe pain. 150 tablet 0  . morphine (MS CONTIN) 30 MG 12 hr tablet Take 1 tablet by mouth every 8 (eight) hours as needed.  0  . ondansetron (ZOFRAN) 8 MG tablet TAKE 0.5-1 TABLETS (4-8 MG TOTAL) BY MOUTH EVERY 8 (EIGHT) HOURS AS NEEDED FOR NAUSEA OR VOMITING. 20 tablet 3  . [DISCONTINUED] methylphenidate (RITALIN) 5 MG tablet Take 1 tablet (5 mg total) by mouth 2 (two) times daily. 1 in am and 1 at lunch 60 tablet 0   No current facility-administered medications on file prior to visit.      PAST SURGICAL HISTORY Past Surgical History:  Procedure Laterality Date  . CHOLECYSTECTOMY    . TONSILLECTOMY AND ADENOIDECTOMY      FAMILY HISTORY: Family History  Problem Relation Age of Onset  . Cancer Father 11    melanoma  . Scleroderma Brother     SOCIAL HISTORY:  reports that she has never smoked. She has never used smokeless tobacco. She reports that she does not drink alcohol or use drugs.  PERFORMANCE STATUS: The patient's performance status is 1 - Symptomatic but completely ambulatory  PHYSICAL EXAM: Most Recent Vital Signs: Blood pressure 118/66, pulse 72, temperature 98 F (36.7 C), temperature source Oral, resp. rate 20, height 5' 6.5" (1.689 m), weight 170 lb 6.4 oz (77.3 kg). BP 118/66 (BP Location: Left Arm, Patient Position: Sitting)   Pulse 72   Temp 98 F (36.7 C) (Oral)   Resp 20   Ht 5' 6.5" (1.689 m)   Wt 170 lb 6.4 oz (77.3 kg)   BMI 27.09 kg/m   General Appearance:    Alert, cooperative, no distress, appears stated age  Head:    Normocephalic, without obvious abnormality, atraumatic  Eyes:    PERRL, conjunctiva/corneas clear, EOM's intact, fundi    benign, both eyes        Throat:   Lips, mucosa, and tongue normal; teeth and gums normal  Neck:   Supple, symmetrical, trachea midline, no adenopathy;    thyroid:  no enlargement/tenderness/nodules; no carotid   bruit or JVD  Back:     Symmetric, no curvature, ROM normal, no CVA  tenderness  Lungs:     Clear to auscultation bilaterally, respirations unlabored  Chest Wall:    No tenderness or deformity   Heart:    Regular rate and rhythm, S1 and S2 normal, no murmur, rub   or gallop     Abdomen:     Soft, non-tender, bowel sounds active all four quadrants,    no masses, no organomegaly        Extremities:   Extremities normal, atraumatic, no cyanosis or edema  Pulses:  2+ and symmetric all extremities  Skin:   Skin color, texture, turgor normal, no rashes or lesions  Lymph nodes:   Cervical, supraclavicular, and axillary nodes normal  Neurologic:   CNII-XII intact, normal strength, sensation and reflexes    throughout    LABORATORY DATA: Results for orders placed or performed in visit on 01/06/16 (from the past 48 hour(s))  CBC w/Diff     Status: Abnormal   Collection Time: 01/06/16  9:57 AM  Result Value Ref Range   WBC 5.2 3.9 - 10.0 10e3/uL   RBC 3.79 3.70 - 5.32 10e6/uL   HGB 9.3 (L) 11.6 - 15.9 g/dL   HCT 28.5 (L) 34.8 - 46.6 %   MCV 75 (L) 81 - 101 fL   MCH 24.5 (L) 26.0 - 34.0 pg   MCHC 32.6 32.0 - 36.0 g/dL   RDW 16.4 (H) 11.1 - 15.7 %   Platelets 375 145 - 400 10e3/uL   NEUT# 2.0 1.5 - 6.5 10e3/uL   LYMPH# 2.6 0.9 - 3.3 10e3/uL   MONO# 0.5 0.1 - 0.9 10e3/uL   Eosinophils Absolute 0.1 0.0 - 0.5 10e3/uL   BASO# 0.0 0.0 - 0.2 10e3/uL   NEUT% 38.3 (L) 39.6 - 80.0 %   LYMPH% 49.2 (H) 14.0 - 48.0 %   MONO% 9.8 0.0 - 13.0 %   EOS% 2.1 0.0 - 7.0 %   BASO% 0.6 0.0 - 2.0 %  Smear     Status: None   Collection Time: 01/06/16  9:57 AM  Result Value Ref Range   Smear Result Smear Available       RADIOGRAPHY: No results found.     PATHOLOGY: None  ASSESSMENT/PLAN: Ms. Reinders is a very pleasant 50 yo white female with history of iron deficiency anemia over the last year. This could possibly be due to the fact that she is on quite a few medications and blocking her absorption.  She is symptomatic at this time weakness, fatigue, SOB with exertion  and chewing ice.  Hgb is down at 9.3 with an MCV of 75.  We will see what her iron studies and other lab work shows and plan to bring her back in later this week for an infusion if needed.   Movantik can be quite expensive. We will see what the cost is for her at the pharmacy here in the [place] and if it is reasonable she will get the prescription filled for her chronic constipation.  We will plan to see her back in 6 weeks for repeat lab work and follow-up.   All questions were answered. Both she and her husband know to call our office with any problems, questions or concerns. We can certainly see her much sooner if necessary.  She was discussed with and also seen by Dr. Marin Olp and he is in agreement with the aforementioned.   [facility]     Addendum:  I saw and examined the patient with Kassadi Presswood. I looked at her blood smear. She's only had microcytic red blood cells. There is some hypochromic red blood cells. I do not see any nuclear red blood cells. She had no target cells. She had no schistocytes or spherocytes. White blood cells appeared normal in morphology. She had no hypersegmented polys. Should no immature myeloid or lymphoid forms. Platelets were adequate in number and size.   Her ferritin was only 7. Her iron saturation was only 8%. She clearly is iron deficient.  We will go ahead and  give her 2 doses of IV iron. I would think that this will make her feel better. I want her to feel better for the holidays.  I don't see that the iron will interfere with her having the stiff man syndrome.  She is very nice. She is trying her best. Her husband was with her. They are a very nice couple.   I feel that we can help her situation and help her with the fatigue.   We spent about 40 minutes with her. We answered all their questions.   We will plan to see her back in about a month or so. By then, her indices should be coming up.   Lattie Haw, MD

## 2016-01-07 ENCOUNTER — Other Ambulatory Visit: Payer: Self-pay | Admitting: Family

## 2016-01-07 ENCOUNTER — Ambulatory Visit (HOSPITAL_BASED_OUTPATIENT_CLINIC_OR_DEPARTMENT_OTHER): Payer: 59

## 2016-01-07 VITALS — BP 107/69 | HR 62 | Temp 97.8°F | Resp 18

## 2016-01-07 DIAGNOSIS — D508 Other iron deficiency anemias: Secondary | ICD-10-CM

## 2016-01-07 DIAGNOSIS — D509 Iron deficiency anemia, unspecified: Secondary | ICD-10-CM

## 2016-01-07 DIAGNOSIS — E559 Vitamin D deficiency, unspecified: Secondary | ICD-10-CM

## 2016-01-07 LAB — VITAMIN B12: Vitamin B12: 467 pg/mL (ref 211–946)

## 2016-01-07 LAB — VITAMIN D 25 HYDROXY (VIT D DEFICIENCY, FRACTURES): Vitamin D, 25-Hydroxy: 10.8 ng/mL — ABNORMAL LOW (ref 30.0–100.0)

## 2016-01-07 LAB — RETICULOCYTES: RETICULOCYTE COUNT: 0.9 % (ref 0.6–2.6)

## 2016-01-07 MED ORDER — FAMOTIDINE IN NACL 20-0.9 MG/50ML-% IV SOLN
INTRAVENOUS | Status: AC
  Filled 2016-01-07: qty 50

## 2016-01-07 MED ORDER — ALTEPLASE 2 MG IJ SOLR
2.0000 mg | Freq: Once | INTRAMUSCULAR | Status: DC | PRN
  Filled 2016-01-07: qty 2

## 2016-01-07 MED ORDER — SODIUM CHLORIDE 0.9% FLUSH
10.0000 mL | INTRAVENOUS | Status: DC | PRN
  Filled 2016-01-07: qty 10

## 2016-01-07 MED ORDER — ERGOCALCIFEROL 1.25 MG (50000 UT) PO CAPS: 50000.0000 [IU] | ORAL_CAPSULE | ORAL | 5 refills | Status: DC

## 2016-01-07 MED ORDER — SODIUM CHLORIDE 0.9 % IV SOLN
Freq: Once | INTRAVENOUS | Status: AC
  Administered 2016-01-07: 11:00:00 via INTRAVENOUS

## 2016-01-07 MED ORDER — FAMOTIDINE IN NACL 20-0.9 MG/50ML-% IV SOLN
20.0000 mg | Freq: Once | INTRAVENOUS | Status: AC
  Administered 2016-01-07: 20 mg via INTRAVENOUS

## 2016-01-07 MED ORDER — SODIUM CHLORIDE 0.9 % IV SOLN
510.0000 mg | Freq: Once | INTRAVENOUS | Status: AC
  Administered 2016-01-07: 510 mg via INTRAVENOUS
  Filled 2016-01-07: qty 17

## 2016-01-07 MED ORDER — HYDROCORTISONE NA SUCCINATE PF 100 MG IJ SOLR
INTRAMUSCULAR | Status: AC
  Filled 2016-01-07: qty 2

## 2016-01-07 MED ORDER — SODIUM CHLORIDE 0.9% FLUSH
3.0000 mL | Freq: Once | INTRAVENOUS | Status: DC | PRN
  Filled 2016-01-07: qty 10

## 2016-01-07 MED ORDER — HEPARIN SOD (PORK) LOCK FLUSH 100 UNIT/ML IV SOLN
500.0000 [IU] | Freq: Once | INTRAVENOUS | Status: DC | PRN
  Filled 2016-01-07: qty 5

## 2016-01-07 MED ORDER — METHYLPREDNISOLONE SODIUM SUCC 125 MG IJ SOLR
60.0000 mg | Freq: Once | INTRAMUSCULAR | Status: AC
  Administered 2016-01-07: 60 mg via INTRAVENOUS

## 2016-01-07 MED ORDER — HEPARIN SOD (PORK) LOCK FLUSH 100 UNIT/ML IV SOLN
250.0000 [IU] | Freq: Once | INTRAVENOUS | Status: DC | PRN
  Filled 2016-01-07: qty 5

## 2016-01-07 NOTE — Progress Notes (Signed)
Patient finished her iron infusion and then within 5 minutes after developed a "chest heaviness." She is allergic to benadryl so we medicated with solumedrol and pepcid. Patient verbalized understanding and agreement. She responded quickly to meds, chest discomfort resolved. We will now switch her to Venofer for her next infusion.

## 2016-01-07 NOTE — Progress Notes (Signed)
1200: Pt started to complain of her chest feeling "heavy" pt denies any other complaints. Pt states the "heavy feeling" does not change with deep breaths or movement. Pt states pain is to middle of her chest and is non radiating. Pt has only 10 minutes left of her observation period and only receiving normal saline at this time. Judson Roch, NP aware of pt symptoms and orders received. Refer to [facility].   1318: Pt states her pain is "better" pt complains of feeling very tired. Pt respirations equal and unlabored. Pt has no signs or symptoms of pain.  1330: Pt left in no apparent distress with her husband.

## 2016-01-07 NOTE — Patient Instructions (Signed)
Ferumoxytol injection What is this medicine? FERUMOXYTOL is an iron complex. Iron is used to make healthy red blood cells, which carry oxygen and nutrients throughout the body. This medicine is used to treat iron deficiency anemia in people with chronic kidney disease. COMMON BRAND NAME(S): Feraheme What should I tell my health care provider before I take this medicine? They need to know if you have any of these conditions: -anemia not caused by low iron levels -high levels of iron in the blood -magnetic resonance imaging (MRI) test scheduled -an unusual or allergic reaction to iron, other medicines, foods, dyes, or preservatives -pregnant or trying to get pregnant -breast-feeding How should I use this medicine? This medicine is for injection into a vein. It is given by a health care professional in a hospital or clinic setting. Talk to your pediatrician regarding the use of this medicine in children. Special care may be needed. What if I miss a dose? It is important not to miss your dose. Call your doctor or health care professional if you are unable to keep an appointment. What may interact with this medicine? This medicine may interact with the following medications: -other iron products What should I watch for while using this medicine? Visit your doctor or healthcare professional regularly. Tell your doctor or healthcare professional if your symptoms do not start to get better or if they get worse. You may need blood work done while you are taking this medicine. You may need to follow a special diet. Talk to your doctor. Foods that contain iron include: whole grains/cereals, dried fruits, beans, or peas, leafy green vegetables, and organ meats (liver, kidney). What side effects may I notice from receiving this medicine? Side effects that you should report to your doctor or health care professional as soon as possible: -allergic reactions like skin rash, itching or hives, swelling of the  face, lips, or tongue -breathing problems -changes in blood pressure -feeling faint or lightheaded, falls -fever or chills -flushing, sweating, or hot feelings -swelling of the ankles or feet Side effects that usually do not require medical attention (report to your doctor or health care professional if they continue or are bothersome): -diarrhea -headache -nausea, vomiting -stomach pain Where should I keep my medicine? This drug is given in a hospital or clinic and will not be stored at home.  2017 Elsevier/Gold Standard (2015-03-07 12:41:49)  

## 2016-01-07 NOTE — Progress Notes (Signed)
Vit D was low at 10. We will have her start supplement 50,000 units by mouth once a week. Prescription sent to her pharmacy in [place]. She verbalized understanding and agreement with the plan.

## 2016-01-08 LAB — HEMOGLOBINOPATHY EVALUATION
HEMOGLOBIN F QUANTITATION: 0 % (ref 0.0–2.0)
HGB C: 0 %
HGB S: 0 %
Hemoglobin A2 Quantitation: 2 % (ref 0.7–3.1)
Hgb A: 98 % (ref 94.0–98.0)

## 2016-01-16 ENCOUNTER — Ambulatory Visit (HOSPITAL_BASED_OUTPATIENT_CLINIC_OR_DEPARTMENT_OTHER): Payer: 59

## 2016-01-16 ENCOUNTER — Other Ambulatory Visit: Payer: Self-pay | Admitting: Family

## 2016-01-16 VITALS — BP 106/60 | HR 64 | Temp 97.8°F | Resp 17

## 2016-01-16 DIAGNOSIS — D508 Other iron deficiency anemias: Secondary | ICD-10-CM

## 2016-01-16 DIAGNOSIS — D509 Iron deficiency anemia, unspecified: Secondary | ICD-10-CM | POA: Diagnosis not present

## 2016-01-16 MED ORDER — FAMOTIDINE IN NACL 20-0.9 MG/50ML-% IV SOLN
20.0000 mg | Freq: Once | INTRAVENOUS | Status: AC
  Administered 2016-01-16: 20 mg via INTRAVENOUS

## 2016-01-16 MED ORDER — SODIUM CHLORIDE 0.9 % IV SOLN
Freq: Once | INTRAVENOUS | Status: AC
  Administered 2016-01-16: 15:00:00 via INTRAVENOUS

## 2016-01-16 MED ORDER — SODIUM CHLORIDE 0.9 % IV SOLN: 510.0000 mg | Freq: Once | INTRAVENOUS | Status: DC

## 2016-01-16 MED ORDER — METHYLPREDNISOLONE SODIUM SUCC 125 MG IJ SOLR
INTRAMUSCULAR | Status: AC
Start: 2016-01-16 — End: 2016-01-16
  Filled 2016-01-16: qty 2

## 2016-01-16 MED ORDER — SODIUM CHLORIDE 0.9 % IV SOLN
200.0000 mg | Freq: Once | INTRAVENOUS | Status: AC
  Administered 2016-01-16: 200 mg via INTRAVENOUS
  Filled 2016-01-16: qty 10

## 2016-01-16 MED ORDER — FAMOTIDINE IN NACL 20-0.9 MG/50ML-% IV SOLN
INTRAVENOUS | Status: AC
  Filled 2016-01-16: qty 50

## 2016-01-16 MED ORDER — METHYLPREDNISOLONE SODIUM SUCC 125 MG IJ SOLR
60.0000 mg | Freq: Once | INTRAMUSCULAR | Status: AC
  Administered 2016-01-16: 60 mg via INTRAVENOUS

## 2016-01-16 NOTE — Patient Instructions (Signed)
Iron Sucrose injection What is this medicine? IRON SUCROSE (AHY ern SOO krohs) is an iron complex. Iron is used to make healthy red blood cells, which carry oxygen and nutrients throughout the body. This medicine is used to treat iron deficiency anemia in people with chronic kidney disease. This medicine may be used for other purposes; ask your health care provider or pharmacist if you have questions. COMMON BRAND NAME(S): Venofer What should I tell my health care provider before I take this medicine? They need to know if you have any of these conditions: -anemia not caused by low iron levels -heart disease -high levels of iron in the blood -kidney disease -liver disease -an unusual or allergic reaction to iron, other medicines, foods, dyes, or preservatives -pregnant or trying to get pregnant -breast-feeding How should I use this medicine? This medicine is for infusion into a vein. It is given by a health care professional in a hospital or clinic setting. Talk to your pediatrician regarding the use of this medicine in children. While this drug may be prescribed for children as young as 2 years for selected conditions, precautions do apply. Overdosage: If you think you have taken too much of this medicine contact a poison control center or emergency room at once. NOTE: This medicine is only for you. Do not share this medicine with others. What if I miss a dose? It is important not to miss your dose. Call your doctor or health care professional if you are unable to keep an appointment. What may interact with this medicine? Do not take this medicine with any of the following medications: -deferoxamine -dimercaprol -other iron products This medicine may also interact with the following medications: -chloramphenicol -deferasirox This list may not describe all possible interactions. Give your health care provider a list of all the medicines, herbs, non-prescription drugs, or dietary  supplements you use. Also tell them if you smoke, drink alcohol, or use illegal drugs. Some items may interact with your medicine. What should I watch for while using this medicine? Visit your doctor or healthcare professional regularly. Tell your doctor or healthcare professional if your symptoms do not start to get better or if they get worse. You may need blood work done while you are taking this medicine. You may need to follow a special diet. Talk to your doctor. Foods that contain iron include: whole grains/cereals, dried fruits, beans, or peas, leafy green vegetables, and organ meats (liver, kidney). What side effects may I notice from receiving this medicine? Side effects that you should report to your doctor or health care professional as soon as possible: -allergic reactions like skin rash, itching or hives, swelling of the face, lips, or tongue -breathing problems -changes in blood pressure -cough -fast, irregular heartbeat -feeling faint or lightheaded, falls -fever or chills -flushing, sweating, or hot feelings -joint or muscle aches/pains -seizures -swelling of the ankles or feet -unusually weak or tired Side effects that usually do not require medical attention (report to your doctor or health care professional if they continue or are bothersome): -diarrhea -feeling achy -headache -irritation at site where injected -nausea, vomiting -stomach upset -tiredness This list may not describe all possible side effects. Call your doctor for medical advice about side effects. You may report side effects to FDA at 1-800-FDA-1088. Where should I keep my medicine? This drug is given in a hospital or clinic and will not be stored at home. NOTE: This sheet is a summary. It may not cover all possible information. If   you have questions about this medicine, talk to your doctor, pharmacist, or health care provider.  2017 Elsevier/Gold Standard (2010-11-13 17:14:35)  

## 2016-01-22 ENCOUNTER — Telehealth: Payer: Self-pay

## 2016-01-22 NOTE — Telephone Encounter (Signed)
Received call from pt stating she is still having some "effects" from Venofer infusion on 11/30. Reports having concentrated urine after leaving infusion that has since resolved. Also reports having bloating and abd cramps the night of infusion that has also resolved.   Pt reports having redness and itching to hand where IV was extending to wrist. States she thought it was from IV dressing, but looks slightly different that reactions she has had to adhesives in the past. Denies rash, but states "just patches of darker red skin." Pt has used cortizone cream and polysporin without relief. States the itching keeps her up at night. Pt can not tolerate benadryl because "it makes me climb the walls."  Per Judson Roch, NP pt to try OTC pepcid bid & Benadryl cream for relief of itching. Pt to contact office in 24-48 hours if not resolved. Verbalizes understanding and repeats back instructions. dph

## 2016-02-20 DIAGNOSIS — Z79891 Long term (current) use of opiate analgesic: Secondary | ICD-10-CM | POA: Diagnosis not present

## 2016-02-20 DIAGNOSIS — G259 Extrapyramidal and movement disorder, unspecified: Secondary | ICD-10-CM | POA: Diagnosis not present

## 2016-02-20 DIAGNOSIS — G894 Chronic pain syndrome: Secondary | ICD-10-CM | POA: Diagnosis not present

## 2016-03-05 ENCOUNTER — Other Ambulatory Visit: Payer: Self-pay | Admitting: Internal Medicine

## 2016-03-06 NOTE — Telephone Encounter (Signed)
Routing to dr plotnikov, please advise, thanks 

## 2016-03-31 ENCOUNTER — Encounter: Payer: Self-pay | Admitting: Internal Medicine

## 2016-03-31 ENCOUNTER — Ambulatory Visit (INDEPENDENT_AMBULATORY_CARE_PROVIDER_SITE_OTHER): Payer: 59 | Admitting: Internal Medicine

## 2016-03-31 DIAGNOSIS — G2582 Stiff-man syndrome: Secondary | ICD-10-CM | POA: Diagnosis not present

## 2016-03-31 DIAGNOSIS — E034 Atrophy of thyroid (acquired): Secondary | ICD-10-CM | POA: Diagnosis not present

## 2016-03-31 DIAGNOSIS — D508 Other iron deficiency anemias: Secondary | ICD-10-CM

## 2016-03-31 DIAGNOSIS — E538 Deficiency of other specified B group vitamins: Secondary | ICD-10-CM

## 2016-03-31 DIAGNOSIS — E559 Vitamin D deficiency, unspecified: Secondary | ICD-10-CM

## 2016-03-31 MED ORDER — DIAZEPAM 10 MG PO TABS: ORAL_TABLET | ORAL | 3 refills | Status: DC

## 2016-03-31 MED ORDER — BUTALBITAL-APAP-CAFFEINE 50-325-40 MG PO TABS: 1.0000 | ORAL_TABLET | Freq: Four times a day (QID) | ORAL | 3 refills | Status: DC | PRN

## 2016-03-31 MED ORDER — DIAZEPAM 10 MG PO TABS: ORAL_TABLET | ORAL | 2 refills | Status: DC

## 2016-03-31 MED ORDER — VITAMIN D3 125 MCG (5000 UT) PO CAPS: ORAL_CAPSULE | ORAL | 3 refills | Status: DC

## 2016-03-31 MED ORDER — BACLOFEN 10 MG PO TABS: 10.0000 mg | ORAL_TABLET | Freq: Two times a day (BID) | ORAL | 3 refills | Status: DC

## 2016-03-31 MED ORDER — ONDANSETRON HCL 8 MG PO TABS: ORAL_TABLET | ORAL | 3 refills | Status: DC

## 2016-03-31 NOTE — Progress Notes (Signed)
Subjective:  Patient ID: Lisa Bruce, female    DOB: 1965-09-04  Age: 51 y.o. 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 baclofen (LIORESAL) 10 MG tablet Take 1 tablet (10 mg total) by mouth 2 (two) times daily. 180 tablet 3  . benzoyl peroxide 10 % gel Apply topically at bedtime.    . butalbital-acetaminophen-caffeine (FIORICET, ESGIC) 50-325-40 MG tablet Take 1 tablet by mouth every 6 (six) hours as needed for headache. 60 tablet 3  . ciclopirox (PENLAC) 8 % solution Apply topically at bedtime. Apply over nail and surrounding skin. Apply daily over previous coat. After seven (7) days, may remove with alcohol and continue cycle. 6.6 mL 0  . diazepam (VALIUM) 10 MG tablet TAKE 3 TABLETS AT 6 AM, 3 TABLETS AT NOON, 3 TABLETS AT 5 PM, AND 4 TABLETS AT BEDTIME 390 tablet 2  . ergocalciferol (VITAMIN D2) 50000 units capsule Take 1 capsule (50,000 Units total) by mouth once a week. 8 capsule 5  . furosemide (LASIX) 20 MG tablet Take 1-2 tablets (20-40 mg total) by mouth daily as needed. 60 tablet 3  . levothyroxine (LEVOTHROID) 137 MCG tablet Take 1 tablet (137 mcg total) by mouth daily before breakfast. 100 tablet 3  . liothyronine (CYTOMEL) 25 MCG tablet Take 12.5 mcg by mouth daily.     . meperidine (DEMEROL) 50 MG tablet Take 1 tablet (50 mg total) by mouth every 4 (four) hours as needed for moderate pain or severe pain. 150 tablet 0  . morphine (MS CONTIN) 30 MG 12 hr tablet Take 1 tablet by mouth every 8 (eight) hours as needed.  0  . naloxegol oxalate (MOVANTIK) 25 MG TABS tablet Take 1 tablet (25 mg total) by mouth daily. 30 tablet 1  . ondansetron (ZOFRAN) 8 MG tablet TAKE 0.5-1 TABLETS (4-8 MG TOTAL) BY MOUTH EVERY 8 (EIGHT) HOURS AS NEEDED FOR NAUSEA OR VOMITING. 20 tablet 3  .  ondansetron (ZOFRAN) 8 MG tablet TAKE 1/2 TO 1 TABLET EVERY 8 HOURS AS NEEDED FOR NAUSEA OR VOMITING 20 tablet 3   No facility-administered medications prior to visit.     ROS Review of Systems  Constitutional: Positive for fatigue. Negative for activity change, appetite change, chills and unexpected weight change.  HENT: Negative for congestion, mouth sores and sinus pressure.   Eyes: Negative for visual disturbance.  Respiratory: Negative for cough and chest tightness.   Gastrointestinal: Negative for abdominal pain and nausea.  Genitourinary: Negative for difficulty urinating, frequency and vaginal pain.  Musculoskeletal: Negative for back pain and gait problem.  Skin: Negative for pallor and rash.  Neurological: Positive for tremors, speech difficulty and weakness. Negative for dizziness, numbness and headaches.  Psychiatric/Behavioral: Negative for confusion, sleep disturbance and suicidal ideas. The patient is nervous/anxious.     Objective:  BP 112/70   Temp 98.9 F (37.2 C) (Oral)   Resp 16   Ht 5\' 7"  (1.702 m)   Wt 180 lb (81.6 kg)   BMI 28.19 kg/m   BP Readings from Last 3 Encounters:  03/31/16 112/70  01/16/16 106/60  01/07/16 107/69    Wt Readings from Last 3 Encounters:  03/31/16 180 lb (81.6 kg)  01/06/16 170 lb 6.4 oz (77.3 kg)  11/27/15 172 lb (78 kg)  Physical Exam  Constitutional: She appears well-developed. No distress.  HENT:  Head: Normocephalic.  Right Ear: External ear normal.  Left Ear: External ear normal.  Nose: Nose normal.  Mouth/Throat: Oropharynx is clear and moist.  Eyes: Conjunctivae are normal. Pupils are equal, round, and reactive to light. Right eye exhibits no discharge. Left eye exhibits no discharge.  Neck: Normal range of motion. Neck supple. No JVD present. No tracheal deviation present. No thyromegaly present.  Cardiovascular: Normal rate, regular rhythm and normal heart sounds.   Pulmonary/Chest: No stridor. No  respiratory distress. She has no wheezes.  Abdominal: Soft. Bowel sounds are normal. She exhibits no distension and no mass. There is no tenderness. There is no rebound and no guarding.  Musculoskeletal: She exhibits tenderness. She exhibits no edema.  Lymphadenopathy:    She has no cervical adenopathy.  Neurological: She displays normal reflexes. No cranial nerve deficit. She exhibits normal muscle tone. Coordination abnormal.  Skin: No rash noted. No erythema.  Psychiatric: She has a normal mood and affect. Her behavior is normal. Judgment and thought content normal.  spastic tremors, speech  Lab Results  Component Value Date   WBC 5.2 01/06/2016   HGB 9.3 (L) 01/06/2016   HCT 28.5 (L) 01/06/2016   PLT 375 01/06/2016   GLUCOSE 75 01/06/2016   CHOL 269 (H) 05/22/2010   TRIG 129.0 05/22/2010   HDL 63.70 05/22/2010   LDLDIRECT 180.1 05/22/2010   ALT 23 01/06/2016   AST 27 01/06/2016   NA 131 (L) 01/06/2016   K 4.3 01/06/2016   CL 102 04/16/2015   CREATININE 0.8 01/06/2016   BUN 6.5 (L) 01/06/2016   CO2 19 (L) 01/06/2016   TSH 0.11 (L) 04/16/2015    Mr Abdomen W Wo Contrast  Result Date: 05/14/2015 CLINICAL DATA:  Abdominal pain with bloating and discomfort. There is a diagnosis of anterior abdominal wall mass in EPIC although it is unclear whether this is an indication for today's MRI. EXAM: MRI ABDOMEN WITHOUT AND WITH CONTRAST TECHNIQUE: Multiplanar multisequence MR imaging of the abdomen was performed both before and after the administration of intravenous contrast. CONTRAST:  16 cc MultiHance COMPARISON:  PET-CT from 10/14/2005 FINDINGS: Lower chest:  Unremarkable. Hepatobiliary: No focal abnormality within the liver parenchyma. Extrahepatic common duct measures up to 8 mm in diameter in this patient status post cholecystectomy. Mild associated prominence of the left intrahepatic biliary ducts noted. The common bile duct in the head of the pancreas is 6 mm diameter. Pancreas: No  focal mass lesion. No dilatation of the main duct. No intraparenchymal cyst. No peripancreatic edema. Spleen: No splenomegaly. No focal mass lesion. Adrenals/Urinary Tract: No adrenal nodule or mass. Kidneys have normal imaging features bilaterally. Stomach/Bowel: Stomach is nondistended. No gastric wall thickening. No evidence of outlet obstruction. Duodenum is normally positioned as is the ligament of Treitz. Visualize small bowel loops and colonic segments of the abdomen are normal in appearance. Vascular/Lymphatic: No abdominal aortic aneurysm. No evidence for lymphadenopathy in the abdomen. Other: No intraperitoneal free fluid. Musculoskeletal: No abnormal marrow signal within the visualized bony anatomy. No evidence for anterior abdominal wall mass from the xiphoid process to umbilicus. IMPRESSION: Slight prominence of the left intrahepatic biliary ducts with mild distention of the extrahepatic common duct. These changes may be related to prior cholecystectomy, but correlation with liver function tests recommended. No evidence of anterior abdominal wall mass between the level of the xiphoid process and the umbilicus. Otherwise unremarkable exam with no features to explain  the patient's history of pain and bloating. Electronically Signed   By: Misty Stanley M.D.   On: 05/14/2015 16:27    Assessment & Plan:   There are no diagnoses linked to this encounter. I am having Ms. Hickam maintain her liothyronine, meperidine, levothyroxine, benzoyl peroxide, ciclopirox, morphine, baclofen, butalbital-acetaminophen-caffeine, diazepam, ondansetron, furosemide, naloxegol oxalate, ergocalciferol, and ondansetron.  No orders of the defined types were placed in this encounter.    Follow-up: No Follow-up on file.  Walker Kehr, MD

## 2016-03-31 NOTE — Assessment & Plan Note (Signed)
On B12 

## 2016-03-31 NOTE — Assessment & Plan Note (Signed)
On Vit D - refractory

## 2016-03-31 NOTE — Progress Notes (Signed)
Pre-visit discussion using our clinic review tool. No additional management support is needed unless otherwise documented below in the visit note.  

## 2016-03-31 NOTE — Assessment & Plan Note (Signed)
Diazepam - high dose  Potential benefits of a long term benzodiazepines  use as well as potential risks  and complications were explained to the patient and were aknowledged.

## 2016-03-31 NOTE — Assessment & Plan Note (Signed)
On Levothroid 

## 2016-04-16 DIAGNOSIS — Z79891 Long term (current) use of opiate analgesic: Secondary | ICD-10-CM | POA: Diagnosis not present

## 2016-04-16 DIAGNOSIS — G259 Extrapyramidal and movement disorder, unspecified: Secondary | ICD-10-CM | POA: Diagnosis not present

## 2016-04-16 DIAGNOSIS — G894 Chronic pain syndrome: Secondary | ICD-10-CM | POA: Diagnosis not present

## 2016-06-18 DIAGNOSIS — G259 Extrapyramidal and movement disorder, unspecified: Secondary | ICD-10-CM | POA: Diagnosis not present

## 2016-06-18 DIAGNOSIS — Z79891 Long term (current) use of opiate analgesic: Secondary | ICD-10-CM | POA: Diagnosis not present

## 2016-06-18 DIAGNOSIS — G894 Chronic pain syndrome: Secondary | ICD-10-CM | POA: Diagnosis not present

## 2016-07-01 DIAGNOSIS — R609 Edema, unspecified: Secondary | ICD-10-CM | POA: Diagnosis not present

## 2016-07-01 DIAGNOSIS — E032 Hypothyroidism due to medicaments and other exogenous substances: Secondary | ICD-10-CM | POA: Diagnosis not present

## 2016-07-31 ENCOUNTER — Other Ambulatory Visit: Payer: Self-pay | Admitting: Internal Medicine

## 2016-08-03 NOTE — Telephone Encounter (Signed)
Could not locate rx called MD authorization into pharmacy...Johny Chess

## 2016-08-03 NOTE — Telephone Encounter (Signed)
Rec'd call from pt this am requesting refill on her diazepam. MD is out of office this week pls advise...Lisa Bruce

## 2016-08-04 DIAGNOSIS — M25562 Pain in left knee: Secondary | ICD-10-CM | POA: Diagnosis not present

## 2016-08-04 DIAGNOSIS — M25561 Pain in right knee: Secondary | ICD-10-CM | POA: Diagnosis not present

## 2016-08-04 DIAGNOSIS — E032 Hypothyroidism due to medicaments and other exogenous substances: Secondary | ICD-10-CM | POA: Diagnosis not present

## 2016-08-10 DIAGNOSIS — Z79891 Long term (current) use of opiate analgesic: Secondary | ICD-10-CM | POA: Diagnosis not present

## 2016-08-10 DIAGNOSIS — G894 Chronic pain syndrome: Secondary | ICD-10-CM | POA: Diagnosis not present

## 2016-08-10 DIAGNOSIS — G259 Extrapyramidal and movement disorder, unspecified: Secondary | ICD-10-CM | POA: Diagnosis not present

## 2016-08-11 ENCOUNTER — Ambulatory Visit (INDEPENDENT_AMBULATORY_CARE_PROVIDER_SITE_OTHER): Payer: 59 | Admitting: Internal Medicine

## 2016-08-11 ENCOUNTER — Encounter: Payer: Self-pay | Admitting: Internal Medicine

## 2016-08-11 DIAGNOSIS — E538 Deficiency of other specified B group vitamins: Secondary | ICD-10-CM | POA: Diagnosis not present

## 2016-08-11 DIAGNOSIS — G2582 Stiff-man syndrome: Secondary | ICD-10-CM | POA: Diagnosis not present

## 2016-08-11 DIAGNOSIS — G894 Chronic pain syndrome: Secondary | ICD-10-CM

## 2016-08-11 DIAGNOSIS — E559 Vitamin D deficiency, unspecified: Secondary | ICD-10-CM | POA: Diagnosis not present

## 2016-08-11 DIAGNOSIS — D508 Other iron deficiency anemias: Secondary | ICD-10-CM | POA: Diagnosis not present

## 2016-08-11 DIAGNOSIS — E034 Atrophy of thyroid (acquired): Secondary | ICD-10-CM

## 2016-08-11 MED ORDER — BACLOFEN 10 MG PO TABS: 10.0000 mg | ORAL_TABLET | Freq: Two times a day (BID) | ORAL | 11 refills | Status: DC

## 2016-08-11 MED ORDER — DIAZEPAM 10 MG PO TABS: ORAL_TABLET | ORAL | 3 refills | Status: DC

## 2016-08-11 MED ORDER — ONDANSETRON HCL 8 MG PO TABS: ORAL_TABLET | ORAL | 3 refills | Status: DC

## 2016-08-11 MED ORDER — BUTALBITAL-APAP-CAFFEINE 50-325-40 MG PO TABS: 1.0000 | ORAL_TABLET | Freq: Four times a day (QID) | ORAL | 3 refills | Status: DC | PRN

## 2016-08-11 NOTE — Progress Notes (Signed)
Subjective:  Patient ID: Lisa Bruce, female    DOB: August 02, 1965  Age: 51 y.o. MRN: [id number]  CC: No chief complaint on file.   HPI Lisa Bruce presents for stiffness, hypothyroidism, chronic pain and anemia f/u  Outpatient Medications Prior to Visit  Medication Sig Dispense Refill  . baclofen (LIORESAL) 10 MG tablet Take 1 tablet (10 mg total) by mouth 2 (two) times daily. 180 tablet 3  . benzoyl peroxide 10 % gel Apply topically at bedtime.    . butalbital-acetaminophen-caffeine (FIORICET, ESGIC) 50-325-40 MG tablet Take 1 tablet by mouth every 6 (six) hours as needed for headache. 60 tablet 3  . Cholecalciferol (VITAMIN D3) 5000 units CAPS 1 po qd 100 capsule 3  . ciclopirox (PENLAC) 8 % solution Apply topically at bedtime. Apply over nail and surrounding skin. Apply daily over previous coat. After seven (7) days, may remove with alcohol and continue cycle. 6.6 mL 0  . diazepam (VALIUM) 10 MG tablet TAKE 3 TABLETS AT 6 AM, 3 TABS AT NOON, 3 TABS AT 5 PM AND 4 TABS AT BEDTIME 150 tablet 0  . ergocalciferol (VITAMIN D2) 50000 units capsule Take 1 capsule (50,000 Units total) by mouth once a week. 8 capsule 5  . levothyroxine (LEVOTHROID) 137 MCG tablet Take 1 tablet (137 mcg total) by mouth daily before breakfast. 100 tablet 3  . liothyronine (CYTOMEL) 25 MCG tablet Take 12.5 mcg by mouth daily.     . meperidine (DEMEROL) 50 MG tablet Take 1 tablet (50 mg total) by mouth every 4 (four) hours as needed for moderate pain or severe pain. 150 tablet 0  . morphine (MS CONTIN) 30 MG 12 hr tablet Take 1 tablet by mouth every 8 (eight) hours as needed.  0  . ondansetron (ZOFRAN) 8 MG tablet TAKE 0.5-1 TABLETS (4-8 MG TOTAL) BY MOUTH EVERY 8 (EIGHT) HOURS AS NEEDED FOR NAUSEA OR VOMITING. 20 tablet 3  . furosemide (LASIX) 20 MG tablet Take 1-2 tablets (20-40 mg total) by mouth daily as needed. 60 tablet 3  . naloxegol oxalate (MOVANTIK) 25 MG TABS tablet Take 1 tablet (25 mg total) by  mouth daily. 30 tablet 1  . ondansetron (ZOFRAN) 8 MG tablet TAKE 1/2 TO 1 TABLET EVERY 8 HOURS AS NEEDED FOR NAUSEA OR VOMITING 20 tablet 3   No facility-administered medications prior to visit.     ROS Review of Systems  Constitutional: Positive for fatigue. Negative for activity change, appetite change, chills and unexpected weight change.  HENT: Negative for congestion, mouth sores and sinus pressure.   Eyes: Negative for visual disturbance.  Respiratory: Negative for cough and chest tightness.   Gastrointestinal: Negative for abdominal pain and nausea.  Genitourinary: Negative for difficulty urinating, frequency and vaginal pain.  Musculoskeletal: Positive for arthralgias, back pain, gait problem, myalgias and neck stiffness.  Skin: Negative for pallor and rash.  Neurological: Negative for dizziness, tremors, weakness, numbness and headaches.  Psychiatric/Behavioral: Negative for confusion and sleep disturbance. The patient is nervous/anxious.     Objective:  BP 128/82 (BP Location: Left Arm, Patient Position: Sitting, Cuff Size: Normal)   Pulse (!) 56   Temp 98.2 F (36.8 C) (Oral)   Ht 5\' 7"  (1.702 m)   Wt 182 lb (82.6 kg)   BMI 28.51 kg/m   BP Readings from Last 3 Encounters:  08/11/16 128/82  03/31/16 112/70  01/16/16 106/60    Wt Readings from Last 3 Encounters:  08/11/16 182 lb (82.6 kg)  03/31/16 180 lb (81.6 kg)  01/06/16 170 lb 6.4 oz (77.3 kg)    Physical Exam  Constitutional: She appears well-developed. No distress.  HENT:  Head: Normocephalic.  Right Ear: External ear normal.  Left Ear: External ear normal.  Nose: Nose normal.  Mouth/Throat: Oropharynx is clear and moist.  Eyes: Conjunctivae are normal. Pupils are equal, round, and reactive to light. Right eye exhibits no discharge. Left eye exhibits no discharge.  Neck: Normal range of motion. Neck supple. No JVD present. No tracheal deviation present. No thyromegaly present.  Cardiovascular:  Normal rate, regular rhythm and normal heart sounds.   Pulmonary/Chest: No stridor. No respiratory distress. She has no wheezes.  Abdominal: Soft. Bowel sounds are normal. She exhibits no distension and no mass. There is no tenderness. There is no rebound and no guarding.  Musculoskeletal: She exhibits tenderness. She exhibits no edema.  Lymphadenopathy:    She has no cervical adenopathy.  Neurological: She displays normal reflexes. No cranial nerve deficit. She exhibits normal muscle tone. Coordination abnormal.  Skin: No rash noted. No erythema.  Psychiatric: She has a normal mood and affect. Her behavior is normal. Judgment and thought content normal.    Lab Results  Component Value Date   WBC 5.2 01/06/2016   HGB 9.3 (L) 01/06/2016   HCT 28.5 (L) 01/06/2016   PLT 375 01/06/2016   GLUCOSE 75 01/06/2016   CHOL 269 (H) 05/22/2010   TRIG 129.0 05/22/2010   HDL 63.70 05/22/2010   LDLDIRECT 180.1 05/22/2010   ALT 23 01/06/2016   AST 27 01/06/2016   NA 131 (L) 01/06/2016   K 4.3 01/06/2016   CL 102 04/16/2015   CREATININE 0.8 01/06/2016   BUN 6.5 (L) 01/06/2016   CO2 19 (L) 01/06/2016   TSH 0.11 (L) 04/16/2015    Mr Abdomen W Wo Contrast  Result Date: 05/14/2015 CLINICAL DATA:  Abdominal pain with bloating and discomfort. There is a diagnosis of anterior abdominal wall mass in EPIC although it is unclear whether this is an indication for today's MRI. EXAM: MRI ABDOMEN WITHOUT AND WITH CONTRAST TECHNIQUE: Multiplanar multisequence MR imaging of the abdomen was performed both before and after the administration of intravenous contrast. CONTRAST:  16 cc MultiHance COMPARISON:  PET-CT from 10/14/2005 FINDINGS: Lower chest:  Unremarkable. Hepatobiliary: No focal abnormality within the liver parenchyma. Extrahepatic common duct measures up to 8 mm in diameter in this patient status post cholecystectomy. Mild associated prominence of the left intrahepatic biliary ducts noted. The common bile  duct in the head of the pancreas is 6 mm diameter. Pancreas: No focal mass lesion. No dilatation of the main duct. No intraparenchymal cyst. No peripancreatic edema. Spleen: No splenomegaly. No focal mass lesion. Adrenals/Urinary Tract: No adrenal nodule or mass. Kidneys have normal imaging features bilaterally. Stomach/Bowel: Stomach is nondistended. No gastric wall thickening. No evidence of outlet obstruction. Duodenum is normally positioned as is the ligament of Treitz. Visualize small bowel loops and colonic segments of the abdomen are normal in appearance. Vascular/Lymphatic: No abdominal aortic aneurysm. No evidence for lymphadenopathy in the abdomen. Other: No intraperitoneal free fluid. Musculoskeletal: No abnormal marrow signal within the visualized bony anatomy. No evidence for anterior abdominal wall mass from the xiphoid process to umbilicus. IMPRESSION: Slight prominence of the left intrahepatic biliary ducts with mild distention of the extrahepatic common duct. These changes may be related to prior cholecystectomy, but correlation with liver function tests recommended. No evidence of anterior abdominal wall mass between the level of  the xiphoid process and the umbilicus. Otherwise unremarkable exam with no features to explain the patient's history of pain and bloating. Electronically Signed   By: Misty Stanley M.D.   On: 05/14/2015 16:27    Assessment & Plan:   There are no diagnoses linked to this encounter. I have discontinued Ms. Vohs's furosemide and naloxegol oxalate. I am also having her maintain her liothyronine, meperidine, levothyroxine, benzoyl peroxide, ciclopirox, morphine, ergocalciferol, Vitamin D3, baclofen, butalbital-acetaminophen-caffeine, ondansetron, diazepam, and torsemide.  Meds ordered this encounter  Medications  . torsemide (DEMADEX) 20 MG tablet    Sig: TAKE 1 TABLET BY MOUTH EVERY DAY AS NEEDED FOR FLUID    Refill:  12     Follow-up: No Follow-up on  file.  Walker Kehr, MD

## 2016-08-11 NOTE — Assessment & Plan Note (Signed)
On Levothroid 

## 2016-08-11 NOTE — Assessment & Plan Note (Signed)
F/u w/Dr Phillips  

## 2016-08-11 NOTE — Assessment & Plan Note (Signed)
On Vit D 

## 2016-08-11 NOTE — Assessment & Plan Note (Signed)
CBC

## 2016-08-11 NOTE — Assessment & Plan Note (Signed)
MS contin

## 2016-08-11 NOTE — Patient Instructions (Signed)
Turmeric

## 2016-08-11 NOTE — Assessment & Plan Note (Signed)
On B12 

## 2016-09-11 ENCOUNTER — Telehealth: Payer: Self-pay | Admitting: Internal Medicine

## 2016-09-11 NOTE — Telephone Encounter (Signed)
Notified pharmacy spoke w/[name] gave Marya Amsler response...Johny Chess

## 2016-09-11 NOTE — Telephone Encounter (Signed)
Pt is needing to get a early refill on her diazepam stating she is going out of town. Check [place] registry last filled 08/13/2016 next refill is due tomorrow 09/12/16. Is it ok to refill 1 day early. Pls advise since MD is out of office...Johny Chess

## 2016-09-11 NOTE — Telephone Encounter (Signed)
[id number] - [place], [place] - [address] [phone] (Phone) [phone] (Fax)   diazepam (VALIUM) 10 MG   Pharmacy is requesting to refill this rx one early. Patient had called and requested.

## 2016-09-11 NOTE — Telephone Encounter (Signed)
Pharmacy called checking on this refill. °

## 2016-09-11 NOTE — Telephone Encounter (Signed)
Ok to refill early  

## 2016-10-06 DIAGNOSIS — G259 Extrapyramidal and movement disorder, unspecified: Secondary | ICD-10-CM | POA: Diagnosis not present

## 2016-10-06 DIAGNOSIS — G894 Chronic pain syndrome: Secondary | ICD-10-CM | POA: Diagnosis not present

## 2016-10-06 DIAGNOSIS — Z79891 Long term (current) use of opiate analgesic: Secondary | ICD-10-CM | POA: Diagnosis not present

## 2016-12-08 ENCOUNTER — Other Ambulatory Visit: Payer: Self-pay | Admitting: Internal Medicine

## 2016-12-09 ENCOUNTER — Encounter: Payer: Self-pay | Admitting: Internal Medicine

## 2016-12-09 ENCOUNTER — Ambulatory Visit (INDEPENDENT_AMBULATORY_CARE_PROVIDER_SITE_OTHER): Payer: 59 | Admitting: Internal Medicine

## 2016-12-09 VITALS — BP 136/88 | Temp 97.7°F | Ht 67.0 in | Wt 186.0 lb

## 2016-12-09 DIAGNOSIS — E538 Deficiency of other specified B group vitamins: Secondary | ICD-10-CM | POA: Diagnosis not present

## 2016-12-09 DIAGNOSIS — G2582 Stiff-man syndrome: Secondary | ICD-10-CM

## 2016-12-09 DIAGNOSIS — K5909 Other constipation: Secondary | ICD-10-CM | POA: Diagnosis not present

## 2016-12-09 DIAGNOSIS — E034 Atrophy of thyroid (acquired): Secondary | ICD-10-CM

## 2016-12-09 DIAGNOSIS — Z23 Encounter for immunization: Secondary | ICD-10-CM

## 2016-12-09 DIAGNOSIS — E559 Vitamin D deficiency, unspecified: Secondary | ICD-10-CM | POA: Diagnosis not present

## 2016-12-09 MED ORDER — LINACLOTIDE 145 MCG PO CAPS: 145.0000 ug | ORAL_CAPSULE | ORAL | 3 refills | Status: DC

## 2016-12-09 MED ORDER — TORSEMIDE 20 MG PO TABS: ORAL_TABLET | ORAL | 6 refills | Status: DC

## 2016-12-09 MED ORDER — ONDANSETRON HCL 8 MG PO TABS: ORAL_TABLET | ORAL | 3 refills | Status: DC

## 2016-12-09 MED ORDER — DIAZEPAM 10 MG PO TABS: ORAL_TABLET | ORAL | 1 refills | Status: DC

## 2016-12-09 MED ORDER — BUTALBITAL-APAP-CAFFEINE 50-325-40 MG PO TABS: 1.0000 | ORAL_TABLET | Freq: Four times a day (QID) | ORAL | 3 refills | Status: DC | PRN

## 2016-12-09 MED ORDER — DIAZEPAM 10 MG PO TABS: ORAL_TABLET | ORAL | 3 refills | Status: DC

## 2016-12-09 MED ORDER — BACLOFEN 10 MG PO TABS: 10.0000 mg | ORAL_TABLET | Freq: Two times a day (BID) | ORAL | 11 refills | Status: DC

## 2016-12-09 NOTE — Assessment & Plan Note (Signed)
On Vit D 

## 2016-12-09 NOTE — Assessment & Plan Note (Signed)
On Levothroid 

## 2016-12-09 NOTE — Assessment & Plan Note (Signed)
Linzess 

## 2016-12-09 NOTE — Progress Notes (Signed)
Subjective:  Patient ID: Lisa Bruce, female    DOB: 1965/07/09  Age: 51 y.o. MRN: [id number]  CC: No chief complaint on file.   HPI Lisa Bruce presents for spastic disease, pain, tremor, anxiety f/u  Outpatient Medications Prior to Visit  Medication Sig Dispense Refill  . baclofen (LIORESAL) 10 MG tablet Take 1 tablet (10 mg total) by mouth 2 (two) times daily. 60 tablet 11  . benzoyl peroxide 10 % gel Apply topically at bedtime.    . butalbital-acetaminophen-caffeine (FIORICET, ESGIC) 50-325-40 MG tablet Take 1 tablet by mouth every 6 (six) hours as needed for headache. 60 tablet 3  . ciclopirox (PENLAC) 8 % solution Apply topically at bedtime. Apply over nail and surrounding skin. Apply daily over previous coat. After seven (7) days, may remove with alcohol and continue cycle. 6.6 mL 0  . diazepam (VALIUM) 10 MG tablet TAKE 3 TABLETS AT 6AM, 3 TABLETS AT NOON, 3 TABLETS AT 5PM, 4 TABLETS AT BEDTIME 390 tablet 1  . ergocalciferol (VITAMIN D2) 50000 units capsule Take 1 capsule (50,000 Units total) by mouth once a week. 8 capsule 5  . levothyroxine (LEVOTHROID) 137 MCG tablet Take 1 tablet (137 mcg total) by mouth daily before breakfast. 100 tablet 3  . liothyronine (CYTOMEL) 25 MCG tablet Take 12.5 mcg by mouth daily.     . meperidine (DEMEROL) 50 MG tablet Take 1 tablet (50 mg total) by mouth every 4 (four) hours as needed for moderate pain or severe pain. 150 tablet 0  . morphine (MS CONTIN) 30 MG 12 hr tablet Take 1 tablet by mouth every 8 (eight) hours as needed.  0  . ondansetron (ZOFRAN) 8 MG tablet TAKE 0.5-1 TABLETS (4-8 MG TOTAL) BY MOUTH EVERY 8 (EIGHT) HOURS AS NEEDED FOR NAUSEA OR VOMITING. 20 tablet 3  . torsemide (DEMADEX) 20 MG tablet TAKE 1 TABLET BY MOUTH EVERY DAY AS NEEDED FOR FLUID  12  . Cholecalciferol (VITAMIN D3) 5000 units CAPS 1 po qd 100 capsule 3   No facility-administered medications prior to visit.     ROS Review of Systems  Constitutional:  Positive for unexpected weight change. Negative for activity change, appetite change, chills and fatigue.  HENT: Negative for congestion, mouth sores and sinus pressure.   Eyes: Negative for visual disturbance.  Respiratory: Negative for cough and chest tightness.   Gastrointestinal: Negative for abdominal pain and nausea.  Genitourinary: Negative for difficulty urinating, frequency and vaginal pain.  Musculoskeletal: Positive for arthralgias, back pain and gait problem.  Skin: Negative for pallor and rash.  Neurological: Positive for tremors and weakness. Negative for dizziness, numbness and headaches.  Psychiatric/Behavioral: Positive for dysphoric mood. Negative for confusion, sleep disturbance and suicidal ideas. The patient is nervous/anxious.     Objective:  BP 136/88 (BP Location: Left Arm, Patient Position: Sitting, Cuff Size: Large)   Temp 97.7 F (36.5 C) (Oral)   Ht 5\' 7"  (1.702 m)   Wt 186 lb (84.4 kg)   BMI 29.13 kg/m   BP Readings from Last 3 Encounters:  12/09/16 136/88  08/11/16 128/82  03/31/16 112/70    Wt Readings from Last 3 Encounters:  12/09/16 186 lb (84.4 kg)  08/11/16 182 lb (82.6 kg)  03/31/16 180 lb (81.6 kg)    Physical Exam  Constitutional: She appears well-developed. No distress.  HENT:  Head: Normocephalic.  Right Ear: External ear normal.  Left Ear: External ear normal.  Nose: Nose normal.  Mouth/Throat: Oropharynx is clear  and moist.  Eyes: Pupils are equal, round, and reactive to light. Conjunctivae are normal. Right eye exhibits no discharge. Left eye exhibits no discharge.  Neck: Normal range of motion. Neck supple. No JVD present. No tracheal deviation present. No thyromegaly present.  Cardiovascular: Normal rate, regular rhythm and normal heart sounds.   Pulmonary/Chest: No stridor. No respiratory distress. She has no wheezes.  Abdominal: Soft. Bowel sounds are normal. She exhibits no distension and no mass. There is no tenderness.  There is no rebound and no guarding.  Musculoskeletal: She exhibits edema and tenderness.  Lymphadenopathy:    She has no cervical adenopathy.  Neurological: She displays normal reflexes. No cranial nerve deficit. She exhibits normal muscle tone. Coordination abnormal.  Skin: No rash noted. No erythema.  Psychiatric: Her behavior is normal. Judgment and thought content normal.   Spastic w/tremor Upset and tearful   Lab Results  Component Value Date   WBC 5.2 01/06/2016   HGB 9.3 (L) 01/06/2016   HCT 28.5 (L) 01/06/2016   PLT 375 01/06/2016   GLUCOSE 75 01/06/2016   CHOL 269 (H) 05/22/2010   TRIG 129.0 05/22/2010   HDL 63.70 05/22/2010   LDLDIRECT 180.1 05/22/2010   ALT 23 01/06/2016   AST 27 01/06/2016   NA 131 (L) 01/06/2016   K 4.3 01/06/2016   CL 102 04/16/2015   CREATININE 0.8 01/06/2016   BUN 6.5 (L) 01/06/2016   CO2 19 (L) 01/06/2016   TSH 0.11 (L) 04/16/2015    Mr Abdomen W Wo Contrast  Result Date: 05/14/2015 CLINICAL DATA:  Abdominal pain with bloating and discomfort. There is a diagnosis of anterior abdominal wall mass in EPIC although it is unclear whether this is an indication for today's MRI. EXAM: MRI ABDOMEN WITHOUT AND WITH CONTRAST TECHNIQUE: Multiplanar multisequence MR imaging of the abdomen was performed both before and after the administration of intravenous contrast. CONTRAST:  16 cc MultiHance COMPARISON:  PET-CT from 10/14/2005 FINDINGS: Lower chest:  Unremarkable. Hepatobiliary: No focal abnormality within the liver parenchyma. Extrahepatic common duct measures up to 8 mm in diameter in this patient status post cholecystectomy. Mild associated prominence of the left intrahepatic biliary ducts noted. The common bile duct in the head of the pancreas is 6 mm diameter. Pancreas: No focal mass lesion. No dilatation of the main duct. No intraparenchymal cyst. No peripancreatic edema. Spleen: No splenomegaly. No focal mass lesion. Adrenals/Urinary Tract: No  adrenal nodule or mass. Kidneys have normal imaging features bilaterally. Stomach/Bowel: Stomach is nondistended. No gastric wall thickening. No evidence of outlet obstruction. Duodenum is normally positioned as is the ligament of Treitz. Visualize small bowel loops and colonic segments of the abdomen are normal in appearance. Vascular/Lymphatic: No abdominal aortic aneurysm. No evidence for lymphadenopathy in the abdomen. Other: No intraperitoneal free fluid. Musculoskeletal: No abnormal marrow signal within the visualized bony anatomy. No evidence for anterior abdominal wall mass from the xiphoid process to umbilicus. IMPRESSION: Slight prominence of the left intrahepatic biliary ducts with mild distention of the extrahepatic common duct. These changes may be related to prior cholecystectomy, but correlation with liver function tests recommended. No evidence of anterior abdominal wall mass between the level of the xiphoid process and the umbilicus. Otherwise unremarkable exam with no features to explain the patient's history of pain and bloating. Electronically Signed   By: Misty Stanley M.D.   On: 05/14/2015 16:27    Assessment & Plan:   There are no diagnoses linked to this encounter. I have discontinued  Ms. Spindel Vitamin D3. I am also having her maintain her liothyronine, meperidine, levothyroxine, benzoyl peroxide, ciclopirox, morphine, ergocalciferol, torsemide, baclofen, butalbital-acetaminophen-caffeine, ondansetron, and diazepam.  No orders of the defined types were placed in this encounter.    Follow-up: No Follow-up on file.  Walker Kehr, MD

## 2016-12-09 NOTE — Assessment & Plan Note (Signed)
On B12 

## 2016-12-09 NOTE — Assessment & Plan Note (Signed)
Meds were renewed

## 2016-12-10 NOTE — Addendum Note (Signed)
Addended by: Karren Cobble on: 12/10/2016 10:09 AM   Modules accepted: Orders

## 2016-12-15 DIAGNOSIS — G259 Extrapyramidal and movement disorder, unspecified: Secondary | ICD-10-CM | POA: Diagnosis not present

## 2016-12-15 DIAGNOSIS — Z79891 Long term (current) use of opiate analgesic: Secondary | ICD-10-CM | POA: Diagnosis not present

## 2016-12-15 DIAGNOSIS — G894 Chronic pain syndrome: Secondary | ICD-10-CM | POA: Diagnosis not present

## 2017-02-04 DIAGNOSIS — G894 Chronic pain syndrome: Secondary | ICD-10-CM | POA: Diagnosis not present

## 2017-02-04 DIAGNOSIS — Z79891 Long term (current) use of opiate analgesic: Secondary | ICD-10-CM | POA: Diagnosis not present

## 2017-02-04 DIAGNOSIS — G259 Extrapyramidal and movement disorder, unspecified: Secondary | ICD-10-CM | POA: Diagnosis not present

## 2017-03-30 ENCOUNTER — Other Ambulatory Visit (INDEPENDENT_AMBULATORY_CARE_PROVIDER_SITE_OTHER): Payer: 59

## 2017-03-30 ENCOUNTER — Ambulatory Visit (INDEPENDENT_AMBULATORY_CARE_PROVIDER_SITE_OTHER): Payer: 59 | Admitting: Internal Medicine

## 2017-03-30 ENCOUNTER — Encounter: Payer: Self-pay | Admitting: Internal Medicine

## 2017-03-30 DIAGNOSIS — E034 Atrophy of thyroid (acquired): Secondary | ICD-10-CM

## 2017-03-30 DIAGNOSIS — R5382 Chronic fatigue, unspecified: Secondary | ICD-10-CM | POA: Diagnosis not present

## 2017-03-30 DIAGNOSIS — E559 Vitamin D deficiency, unspecified: Secondary | ICD-10-CM

## 2017-03-30 DIAGNOSIS — D508 Other iron deficiency anemias: Secondary | ICD-10-CM

## 2017-03-30 DIAGNOSIS — G2582 Stiff-man syndrome: Secondary | ICD-10-CM

## 2017-03-30 DIAGNOSIS — E538 Deficiency of other specified B group vitamins: Secondary | ICD-10-CM

## 2017-03-30 DIAGNOSIS — K5909 Other constipation: Secondary | ICD-10-CM

## 2017-03-30 LAB — HEPATIC FUNCTION PANEL
ALT: 24 U/L (ref 0–35)
AST: 24 U/L (ref 0–37)
Albumin: 4.1 g/dL (ref 3.5–5.2)
Alkaline Phosphatase: 103 U/L (ref 39–117)
BILIRUBIN DIRECT: 0.1 mg/dL (ref 0.0–0.3)
BILIRUBIN TOTAL: 0.4 mg/dL (ref 0.2–1.2)
TOTAL PROTEIN: 7.5 g/dL (ref 6.0–8.3)

## 2017-03-30 LAB — CBC WITH DIFFERENTIAL/PLATELET
Basophils Absolute: 0.1 10*3/uL (ref 0.0–0.1)
Basophils Relative: 1 % (ref 0.0–3.0)
Eosinophils Absolute: 0.1 10*3/uL (ref 0.0–0.7)
Eosinophils Relative: 1.6 % (ref 0.0–5.0)
HEMATOCRIT: 36.9 % (ref 36.0–46.0)
Hemoglobin: 12.6 g/dL (ref 12.0–15.0)
LYMPHS ABS: 2.6 10*3/uL (ref 0.7–4.0)
LYMPHS PCT: 49.9 % — AB (ref 12.0–46.0)
MCHC: 34.2 g/dL (ref 30.0–36.0)
MCV: 92.3 fl (ref 78.0–100.0)
MONOS PCT: 6.1 % (ref 3.0–12.0)
Monocytes Absolute: 0.3 10*3/uL (ref 0.1–1.0)
NEUTROS PCT: 41.4 % — AB (ref 43.0–77.0)
Neutro Abs: 2.2 10*3/uL (ref 1.4–7.7)
Platelets: 301 10*3/uL (ref 150.0–400.0)
RBC: 4 Mil/uL (ref 3.87–5.11)
RDW: 14.2 % (ref 11.5–15.5)
WBC: 5.3 10*3/uL (ref 4.0–10.5)

## 2017-03-30 LAB — BASIC METABOLIC PANEL
BUN: 7 mg/dL (ref 6–23)
CO2: 23 mEq/L (ref 19–32)
CREATININE: 0.85 mg/dL (ref 0.40–1.20)
Calcium: 8.8 mg/dL (ref 8.4–10.5)
Chloride: 101 mEq/L (ref 96–112)
GFR: 74.94 mL/min (ref >60.00)
Glucose, Bld: 101 mg/dL — ABNORMAL HIGH (ref 70–99)
Potassium: 3.9 mEq/L (ref 3.5–5.1)
Sodium: 132 mEq/L — ABNORMAL LOW (ref 135–145)

## 2017-03-30 MED ORDER — BACLOFEN 10 MG PO TABS: 10.0000 mg | ORAL_TABLET | Freq: Two times a day (BID) | ORAL | 11 refills | Status: DC

## 2017-03-30 MED ORDER — ONDANSETRON 4 MG PO TBDP: 4.0000 mg | ORAL_TABLET | Freq: Three times a day (TID) | ORAL | 2 refills | Status: DC | PRN

## 2017-03-30 MED ORDER — BUTALBITAL-APAP-CAFFEINE 50-325-40 MG PO TABS: 1.0000 | ORAL_TABLET | Freq: Four times a day (QID) | ORAL | 3 refills | Status: DC | PRN

## 2017-03-30 MED ORDER — DIAZEPAM 10 MG PO TABS: ORAL_TABLET | ORAL | 3 refills | Status: DC

## 2017-03-30 NOTE — Assessment & Plan Note (Signed)
Linzess 

## 2017-03-30 NOTE — Assessment & Plan Note (Signed)
[facility] 

## 2017-03-30 NOTE — Assessment & Plan Note (Signed)
On Vit D 

## 2017-03-30 NOTE — Patient Instructions (Signed)
Cologuard

## 2017-03-30 NOTE — Assessment & Plan Note (Signed)
[facility] - on Fentanyl patch  Potential benefits of a long term opioids and Benzo use as well as potential risks (i.e. addiction risk, apnea etc) and complications (i.e. Somnolence, constipation and others) were explained to the patient and were aknowledged.

## 2017-03-30 NOTE — Assessment & Plan Note (Signed)
Labs

## 2017-03-30 NOTE — Progress Notes (Signed)
Subjective:  Patient ID: Lisa Bruce, female    DOB: 1965-04-08  Age: 52 y.o. MRN: [id number]  CC: No chief complaint on file.   HPI Lisa Bruce presents for   Outpatient Medications Prior to Visit  Medication Sig Dispense Refill  . baclofen (LIORESAL) 10 MG tablet Take 1 tablet (10 mg total) by mouth 2 (two) times daily. 60 tablet 11  . benzoyl peroxide 10 % gel Apply topically at bedtime.    . butalbital-acetaminophen-caffeine (FIORICET, ESGIC) 50-325-40 MG tablet Take 1 tablet by mouth every 6 (six) hours as needed for headache. 60 tablet 3  . ciclopirox (PENLAC) 8 % solution Apply topically at bedtime. Apply over nail and surrounding skin. Apply daily over previous coat. After seven (7) days, may remove with alcohol and continue cycle. 6.6 mL 0  . diazepam (VALIUM) 10 MG tablet TAKE 3 TABLETS AT 6AM, 3 TABLETS AT NOON, 3 TABLETS AT 5PM, 4 TABLETS AT BEDTIME 390 tablet 3  . ergocalciferol (VITAMIN D2) 50000 units capsule Take 1 capsule (50,000 Units total) by mouth once a week. 8 capsule 5  . ibuprofen (ADVIL,MOTRIN) 200 MG tablet Take by mouth.    . levothyroxine (LEVOTHROID) 137 MCG tablet Take 1 tablet (137 mcg total) by mouth daily before breakfast. 100 tablet 3  . levothyroxine (SYNTHROID) 125 MCG tablet Take by mouth.    . linaclotide (LINZESS) 145 MCG CAPS capsule Take 1 capsule (145 mcg total) by mouth 1 day or 1 dose. 90 capsule 3  . liothyronine (CYTOMEL) 25 MCG tablet Take 12.5 mcg by mouth daily.     . meperidine (DEMEROL) 50 MG tablet Take 1 tablet (50 mg total) by mouth every 4 (four) hours as needed for moderate pain or severe pain. 150 tablet 0  . morphine (MS CONTIN) 30 MG 12 hr tablet Take 1 tablet by mouth every 8 (eight) hours as needed.  0  . ondansetron (ZOFRAN ODT) 4 MG disintegrating tablet Take by mouth.    . ondansetron (ZOFRAN) 8 MG tablet TAKE 0.5-1 TABLETS (4-8 MG TOTAL) BY MOUTH EVERY 8 (EIGHT) HOURS AS NEEDED FOR NAUSEA OR VOMITING. 20 tablet 3    . ranitidine (ZANTAC) 150 MG tablet Take by mouth.    [name] tiZANidine (ZANAFLEX) 4 MG capsule Take by mouth.    . torsemide (DEMADEX) 20 MG tablet TAKE 1 TABLET BY MOUTH EVERY DAY AS NEEDED FOR FLUID 30 tablet 6  . bisacodyl (DULCOLAX) 5 MG EC tablet Take by mouth.     No facility-administered medications prior to visit.     ROS Review of Systems  Constitutional: Positive for fatigue. Negative for activity change, appetite change, chills and unexpected weight change.  HENT: Negative for congestion, mouth sores and sinus pressure.   Eyes: Negative for visual disturbance.  Respiratory: Negative for cough and chest tightness.   Gastrointestinal: Positive for abdominal pain and nausea.  Genitourinary: Negative for difficulty urinating, frequency and vaginal pain.  Musculoskeletal: Positive for arthralgias, back pain, gait problem, myalgias and neck stiffness.  Skin: Negative for pallor and rash.  Neurological: Positive for tremors, weakness and light-headedness. Negative for dizziness, numbness and headaches.  Psychiatric/Behavioral: Positive for dysphoric mood. Negative for confusion, sleep disturbance and suicidal ideas. The patient is nervous/anxious.   spasms  Objective:  BP 118/72 (BP Location: Left Arm, Patient Position: Sitting, Cuff Size: Large)   Temp 97.9 F (36.6 C) (Oral)   Wt 189 lb (85.7 kg)   BMI 29.60 kg/m   BP  Readings from Last 3 Encounters:  03/30/17 118/72  12/09/16 136/88  08/11/16 128/82    Wt Readings from Last 3 Encounters:  03/30/17 189 lb (85.7 kg)  12/09/16 186 lb (84.4 kg)  08/11/16 182 lb (82.6 kg)    Physical Exam  Constitutional: She appears well-developed. No distress.  HENT:  Head: Normocephalic.  Right Ear: External ear normal.  Left Ear: External ear normal.  Nose: Nose normal.  Mouth/Throat: Oropharynx is clear and moist.  Eyes: Conjunctivae are normal. Pupils are equal, round, and reactive to light. Right eye exhibits no discharge.  Left eye exhibits no discharge.  Neck: Normal range of motion. Neck supple. No JVD present. No tracheal deviation present. No thyromegaly present.  Cardiovascular: Normal rate, regular rhythm and normal heart sounds.  Pulmonary/Chest: No stridor. No respiratory distress. She has no wheezes.  Abdominal: Soft. Bowel sounds are normal. She exhibits no distension and no mass. There is no tenderness. There is no rebound and no guarding.  Musculoskeletal: She exhibits tenderness. She exhibits no edema.  Lymphadenopathy:    She has no cervical adenopathy.  Neurological: She displays abnormal reflex. No cranial nerve deficit. She exhibits abnormal muscle tone. Coordination abnormal.  Skin: No rash noted. No erythema.  Psychiatric: Her behavior is normal. Judgment and thought content normal.    Lab Results  Component Value Date   WBC 5.2 01/06/2016   HGB 9.3 (L) 01/06/2016   HCT 28.5 (L) 01/06/2016   PLT 375 01/06/2016   GLUCOSE 75 01/06/2016   CHOL 269 (H) 05/22/2010   TRIG 129.0 05/22/2010   HDL 63.70 05/22/2010   LDLDIRECT 180.1 05/22/2010   ALT 23 01/06/2016   AST 27 01/06/2016   NA 131 (L) 01/06/2016   K 4.3 01/06/2016   CL 102 04/16/2015   CREATININE 0.8 01/06/2016   BUN 6.5 (L) 01/06/2016   CO2 19 (L) 01/06/2016   TSH 0.11 (L) 04/16/2015    Mr Abdomen W Wo Contrast  Result Date: 05/14/2015 CLINICAL DATA:  Abdominal pain with bloating and discomfort. There is a diagnosis of anterior abdominal wall mass in EPIC although it is unclear whether this is an indication for today's MRI. EXAM: MRI ABDOMEN WITHOUT AND WITH CONTRAST TECHNIQUE: Multiplanar multisequence MR imaging of the abdomen was performed both before and after the administration of intravenous contrast. CONTRAST:  16 cc MultiHance COMPARISON:  PET-CT from 10/14/2005 FINDINGS: Lower chest:  Unremarkable. Hepatobiliary: No focal abnormality within the liver parenchyma. Extrahepatic common duct measures up to 8 mm in diameter  in this patient status post cholecystectomy. Mild associated prominence of the left intrahepatic biliary ducts noted. The common bile duct in the head of the pancreas is 6 mm diameter. Pancreas: No focal mass lesion. No dilatation of the main duct. No intraparenchymal cyst. No peripancreatic edema. Spleen: No splenomegaly. No focal mass lesion. Adrenals/Urinary Tract: No adrenal nodule or mass. Kidneys have normal imaging features bilaterally. Stomach/Bowel: Stomach is nondistended. No gastric wall thickening. No evidence of outlet obstruction. Duodenum is normally positioned as is the ligament of Treitz. Visualize small bowel loops and colonic segments of the abdomen are normal in appearance. Vascular/Lymphatic: No abdominal aortic aneurysm. No evidence for lymphadenopathy in the abdomen. Other: No intraperitoneal free fluid. Musculoskeletal: No abnormal marrow signal within the visualized bony anatomy. No evidence for anterior abdominal wall mass from the xiphoid process to umbilicus. IMPRESSION: Slight prominence of the left intrahepatic biliary ducts with mild distention of the extrahepatic common duct. These changes may be related to  prior cholecystectomy, but correlation with liver function tests recommended. No evidence of anterior abdominal wall mass between the level of the xiphoid process and the umbilicus. Otherwise unremarkable exam with no features to explain the patient's history of pain and bloating. Electronically Signed   By: Misty Stanley M.D.   On: 05/14/2015 16:27    Assessment & Plan:   There are no diagnoses linked to this encounter. I am having Jaycelyn C. Homesley maintain her liothyronine, meperidine, levothyroxine, benzoyl peroxide, ciclopirox, morphine, ergocalciferol, diazepam, baclofen, butalbital-acetaminophen-caffeine, ondansetron, torsemide, linaclotide, ranitidine, ondansetron, ibuprofen, bisacodyl, tiZANidine, and levothyroxine.  No orders of the defined types were placed in  this encounter.    Follow-up: No Follow-up on file.  Walker Kehr, MD

## 2017-03-30 NOTE — Assessment & Plan Note (Signed)
Iron and CBC

## 2017-03-30 NOTE — Assessment & Plan Note (Signed)
On Vit B12 

## 2017-03-31 ENCOUNTER — Other Ambulatory Visit (INDEPENDENT_AMBULATORY_CARE_PROVIDER_SITE_OTHER): Payer: 59

## 2017-03-31 ENCOUNTER — Other Ambulatory Visit: Payer: Self-pay | Admitting: Internal Medicine

## 2017-03-31 DIAGNOSIS — E538 Deficiency of other specified B group vitamins: Secondary | ICD-10-CM | POA: Diagnosis not present

## 2017-03-31 DIAGNOSIS — D508 Other iron deficiency anemias: Secondary | ICD-10-CM | POA: Diagnosis not present

## 2017-03-31 DIAGNOSIS — E034 Atrophy of thyroid (acquired): Secondary | ICD-10-CM

## 2017-03-31 DIAGNOSIS — R5382 Chronic fatigue, unspecified: Secondary | ICD-10-CM

## 2017-03-31 DIAGNOSIS — E559 Vitamin D deficiency, unspecified: Secondary | ICD-10-CM | POA: Diagnosis not present

## 2017-03-31 LAB — URINALYSIS
Bilirubin Urine: NEGATIVE
Ketones, ur: NEGATIVE
Leukocytes, UA: NEGATIVE
Nitrite: NEGATIVE
Specific Gravity, Urine: 1.01 (ref 1.000–1.030)
Total Protein, Urine: NEGATIVE
URINE GLUCOSE: NEGATIVE
UROBILINOGEN UA: 0.2 (ref 0.0–1.0)
pH: 6.5 (ref 5.0–8.0)

## 2017-03-31 LAB — VITAMIN B12: Vitamin B-12: 508 pg/mL (ref 211–911)

## 2017-03-31 LAB — VITAMIN D 25 HYDROXY (VIT D DEFICIENCY, FRACTURES): VITD: 11.14 ng/mL — AB (ref 30.00–100.00)

## 2017-03-31 LAB — IRON,TIBC AND FERRITIN PANEL
%SAT: 50 % (ref 11–50)
Ferritin: 10 ng/mL (ref 10–232)
Iron: 146 ug/dL (ref 45–160)
TIBC: 294 mcg/dL (calc) (ref 250–450)

## 2017-03-31 LAB — TSH: TSH: 0.44 u[IU]/mL (ref 0.35–4.50)

## 2017-03-31 MED ORDER — ERGOCALCIFEROL 1.25 MG (50000 UT) PO CAPS: 50000.0000 [IU] | ORAL_CAPSULE | ORAL | 11 refills | Status: DC

## 2017-04-01 ENCOUNTER — Telehealth: Payer: Self-pay | Admitting: Internal Medicine

## 2017-04-01 NOTE — Telephone Encounter (Signed)
The results for the urine test have not been read yet. Would you like the other labs mailed or wait for that and mail it all at once?

## 2017-04-01 NOTE — Telephone Encounter (Signed)
Copied from [place] [phone]. Topic: General - Other >> Apr 01, 2017  1:36 PM Cecelia Byars, NT wrote: Reason for CRM: Patient called and would like a copy of her labs mailed to her ,and also she would like the results from a urine sample left on  03/31/17 please advise

## 2017-04-02 NOTE — Telephone Encounter (Signed)
Mailed results and left message to call back on UA results

## 2017-04-06 ENCOUNTER — Telehealth: Payer: Self-pay | Admitting: Internal Medicine

## 2017-04-06 NOTE — Telephone Encounter (Signed)
[facility] transferred patient over to office for Urinalysis results.  States they received CRM but did not receive results with the CRM.  I gave patient MD's response.

## 2017-04-06 NOTE — Telephone Encounter (Signed)
noted 

## 2017-04-15 DIAGNOSIS — G259 Extrapyramidal and movement disorder, unspecified: Secondary | ICD-10-CM | POA: Diagnosis not present

## 2017-04-15 DIAGNOSIS — G894 Chronic pain syndrome: Secondary | ICD-10-CM | POA: Diagnosis not present

## 2017-04-15 DIAGNOSIS — Z79891 Long term (current) use of opiate analgesic: Secondary | ICD-10-CM | POA: Diagnosis not present

## 2017-06-10 DIAGNOSIS — G894 Chronic pain syndrome: Secondary | ICD-10-CM | POA: Diagnosis not present

## 2017-06-10 DIAGNOSIS — Z79891 Long term (current) use of opiate analgesic: Secondary | ICD-10-CM | POA: Diagnosis not present

## 2017-06-10 DIAGNOSIS — G259 Extrapyramidal and movement disorder, unspecified: Secondary | ICD-10-CM | POA: Diagnosis not present

## 2017-07-06 ENCOUNTER — Encounter: Payer: Self-pay | Admitting: Internal Medicine

## 2017-07-06 ENCOUNTER — Ambulatory Visit (INDEPENDENT_AMBULATORY_CARE_PROVIDER_SITE_OTHER): Payer: 59 | Admitting: Internal Medicine

## 2017-07-06 DIAGNOSIS — E034 Atrophy of thyroid (acquired): Secondary | ICD-10-CM

## 2017-07-06 DIAGNOSIS — E559 Vitamin D deficiency, unspecified: Secondary | ICD-10-CM | POA: Diagnosis not present

## 2017-07-06 DIAGNOSIS — K5901 Slow transit constipation: Secondary | ICD-10-CM

## 2017-07-06 DIAGNOSIS — E063 Autoimmune thyroiditis: Secondary | ICD-10-CM

## 2017-07-06 DIAGNOSIS — D508 Other iron deficiency anemias: Secondary | ICD-10-CM

## 2017-07-06 DIAGNOSIS — E538 Deficiency of other specified B group vitamins: Secondary | ICD-10-CM

## 2017-07-06 MED ORDER — TORSEMIDE 20 MG PO TABS: ORAL_TABLET | ORAL | 1 refills | Status: DC

## 2017-07-06 MED ORDER — BUTALBITAL-APAP-CAFFEINE 50-325-40 MG PO TABS: 1.0000 | ORAL_TABLET | Freq: Four times a day (QID) | ORAL | 3 refills | Status: DC | PRN

## 2017-07-06 MED ORDER — LIOTHYRONINE SODIUM 25 MCG PO TABS: 12.5000 ug | ORAL_TABLET | Freq: Every day | ORAL | 1 refills | Status: DC

## 2017-07-06 MED ORDER — ONDANSETRON HCL 8 MG PO TABS: ORAL_TABLET | ORAL | 3 refills | Status: DC

## 2017-07-06 MED ORDER — LEVOTHYROXINE SODIUM 137 MCG PO TABS: 137.0000 ug | ORAL_TABLET | Freq: Every day | ORAL | 3 refills | Status: DC

## 2017-07-06 MED ORDER — DIAZEPAM 10 MG PO TABS: ORAL_TABLET | ORAL | 3 refills | Status: DC

## 2017-07-06 MED ORDER — BACLOFEN 10 MG PO TABS: 10.0000 mg | ORAL_TABLET | Freq: Two times a day (BID) | ORAL | 3 refills | Status: DC

## 2017-07-06 NOTE — Progress Notes (Signed)
Subjective:  Patient ID: Lisa Bruce, female    DOB: April 20, 1965  Age: 52 y.o. MRN: [id number]  CC: No chief complaint on file.   HPI Lisa Bruce presents for chronic pains, spasms, anxiety, constipation f/u  Outpatient Medications Prior to Visit  Medication Sig Dispense Refill  . baclofen (LIORESAL) 10 MG tablet Take 1 tablet (10 mg total) by mouth 2 (two) times daily. 60 tablet 11  . benzoyl peroxide 10 % gel Apply topically at bedtime.    . bisacodyl (DULCOLAX) 5 MG EC tablet Take by mouth.    . butalbital-acetaminophen-caffeine (FIORICET, ESGIC) 50-325-40 MG tablet Take 1 tablet by mouth every 6 (six) hours as needed for headache. 60 tablet 3  . ciclopirox (PENLAC) 8 % solution Apply topically at bedtime. Apply over nail and surrounding skin. Apply daily over previous coat. After seven (7) days, may remove with alcohol and continue cycle. 6.6 mL 0  . diazepam (VALIUM) 10 MG tablet TAKE 3 TABLETS AT 6AM, 3 TABLETS AT NOON, 3 TABLETS AT 5PM, 4 TABLETS AT BEDTIME 390 tablet 3  . ergocalciferol (VITAMIN D2) 50000 units capsule Take 1 capsule (50,000 Units total) by mouth once a week. 8 capsule 11  . ibuprofen (ADVIL,MOTRIN) 200 MG tablet Take by mouth.    . levothyroxine (LEVOTHROID) 137 MCG tablet Take 1 tablet (137 mcg total) by mouth daily before breakfast. 100 tablet 3  . levothyroxine (SYNTHROID) 125 MCG tablet Take by mouth.    . linaclotide (LINZESS) 145 MCG CAPS capsule Take 1 capsule (145 mcg total) by mouth 1 day or 1 dose. 90 capsule 3  . liothyronine (CYTOMEL) 25 MCG tablet Take 12.5 mcg by mouth daily.     . meperidine (DEMEROL) 50 MG tablet Take 1 tablet (50 mg total) by mouth every 4 (four) hours as needed for moderate pain or severe pain. 150 tablet 0  . morphine (MS CONTIN) 30 MG 12 hr tablet Take 1 tablet by mouth every 8 (eight) hours as needed.  0  . ondansetron (ZOFRAN ODT) 4 MG disintegrating tablet Take 1 tablet (4 mg total) by mouth every 8 (eight) hours as  needed for nausea or vomiting. 20 tablet 2  . ondansetron (ZOFRAN) 8 MG tablet TAKE 0.5-1 TABLETS (4-8 MG TOTAL) BY MOUTH EVERY 8 (EIGHT) HOURS AS NEEDED FOR NAUSEA OR VOMITING. 20 tablet 3  . ranitidine (ZANTAC) 150 MG tablet Take by mouth.    [name] tiZANidine (ZANAFLEX) 4 MG capsule Take by mouth.    . torsemide (DEMADEX) 20 MG tablet TAKE 1 TABLET BY MOUTH EVERY DAY AS NEEDED FOR FLUID 30 tablet 6   No facility-administered medications prior to visit.     ROS Review of Systems  Constitutional: Positive for diaphoresis and fatigue. Negative for activity change, appetite change, chills and unexpected weight change.  HENT: Negative for congestion, mouth sores and sinus pressure.   Eyes: Negative for visual disturbance.  Respiratory: Negative for cough and chest tightness.   Gastrointestinal: Positive for constipation. Negative for abdominal pain and nausea.  Genitourinary: Negative for difficulty urinating, frequency and vaginal pain.  Musculoskeletal: Positive for arthralgias, back pain, gait problem, myalgias, neck pain and neck stiffness.  Skin: Negative for pallor and rash.  Neurological: Positive for dizziness and weakness. Negative for tremors, numbness and headaches.  Psychiatric/Behavioral: Positive for decreased concentration, dysphoric mood and sleep disturbance. Negative for confusion and suicidal ideas. The patient is nervous/anxious.     Objective:  BP 118/80 (BP Location: Left Arm,  Patient Position: Sitting, Cuff Size: Normal)   Pulse 72   Temp 98 F (36.7 C) (Oral)   Ht 5\' 7"  (1.702 m)   Wt 184 lb (83.5 kg)   BMI 28.82 kg/m   BP Readings from Last 3 Encounters:  07/06/17 118/80  03/30/17 118/72  12/09/16 136/88    Wt Readings from Last 3 Encounters:  07/06/17 184 lb (83.5 kg)  03/30/17 189 lb (85.7 kg)  12/09/16 186 lb (84.4 kg)    Physical Exam  Constitutional: She appears well-developed. No distress.  HENT:  Head: Normocephalic.  Right Ear: External ear  normal.  Left Ear: External ear normal.  Nose: Nose normal.  Mouth/Throat: Oropharynx is clear and moist.  Eyes: Pupils are equal, round, and reactive to light. Conjunctivae are normal. Right eye exhibits no discharge. Left eye exhibits no discharge.  Neck: Normal range of motion. Neck supple. No JVD present. No tracheal deviation present. No thyromegaly present.  Cardiovascular: Normal rate, regular rhythm and normal heart sounds.  Pulmonary/Chest: No stridor. No respiratory distress. She has no wheezes.  Abdominal: Soft. Bowel sounds are normal. She exhibits no distension and no mass. There is no tenderness. There is no rebound and no guarding.  Musculoskeletal: She exhibits tenderness. She exhibits no edema.  Lymphadenopathy:    She has no cervical adenopathy.  Neurological: She displays abnormal reflex. No cranial nerve deficit. She exhibits abnormal muscle tone. Coordination abnormal.  Skin: No rash noted. No erythema.  Psychiatric: Her behavior is normal. Judgment and thought content normal.   Spastic movements  Lab Results  Component Value Date   WBC 5.3 03/30/2017   HGB 12.6 03/30/2017   HCT 36.9 03/30/2017   PLT 301.0 03/30/2017   GLUCOSE 101 (H) 03/30/2017   CHOL 269 (H) 05/22/2010   TRIG 129.0 05/22/2010   HDL 63.70 05/22/2010   LDLDIRECT 180.1 05/22/2010   ALT 24 03/30/2017   AST 24 03/30/2017   NA 132 (L) 03/30/2017   K 3.9 03/30/2017   CL 101 03/30/2017   CREATININE 0.85 03/30/2017   BUN 7 03/30/2017   CO2 23 03/30/2017   TSH 0.44 03/30/2017    Mr Abdomen W Wo Contrast  Result Date: 05/14/2015 CLINICAL DATA:  Abdominal pain with bloating and discomfort. There is a diagnosis of anterior abdominal wall mass in EPIC although it is unclear whether this is an indication for today's MRI. EXAM: MRI ABDOMEN WITHOUT AND WITH CONTRAST TECHNIQUE: Multiplanar multisequence MR imaging of the abdomen was performed both before and after the administration of intravenous  contrast. CONTRAST:  16 cc MultiHance COMPARISON:  PET-CT from 10/14/2005 FINDINGS: Lower chest:  Unremarkable. Hepatobiliary: No focal abnormality within the liver parenchyma. Extrahepatic common duct measures up to 8 mm in diameter in this patient status post cholecystectomy. Mild associated prominence of the left intrahepatic biliary ducts noted. The common bile duct in the head of the pancreas is 6 mm diameter. Pancreas: No focal mass lesion. No dilatation of the main duct. No intraparenchymal cyst. No peripancreatic edema. Spleen: No splenomegaly. No focal mass lesion. Adrenals/Urinary Tract: No adrenal nodule or mass. Kidneys have normal imaging features bilaterally. Stomach/Bowel: Stomach is nondistended. No gastric wall thickening. No evidence of outlet obstruction. Duodenum is normally positioned as is the ligament of Treitz. Visualize small bowel loops and colonic segments of the abdomen are normal in appearance. Vascular/Lymphatic: No abdominal aortic aneurysm. No evidence for lymphadenopathy in the abdomen. Other: No intraperitoneal free fluid. Musculoskeletal: No abnormal marrow signal within the visualized  bony anatomy. No evidence for anterior abdominal wall mass from the xiphoid process to umbilicus. IMPRESSION: Slight prominence of the left intrahepatic biliary ducts with mild distention of the extrahepatic common duct. These changes may be related to prior cholecystectomy, but correlation with liver function tests recommended. No evidence of anterior abdominal wall mass between the level of the xiphoid process and the umbilicus. Otherwise unremarkable exam with no features to explain the patient's history of pain and bloating. Electronically Signed   By: Misty Stanley M.D.   On: 05/14/2015 16:27    Assessment & Plan:   There are no diagnoses linked to this encounter. I am having Lisa Bruce maintain her liothyronine, meperidine, levothyroxine, benzoyl peroxide, ciclopirox, morphine,  ondansetron, torsemide, linaclotide, ranitidine, ibuprofen, bisacodyl, tiZANidine, levothyroxine, butalbital-acetaminophen-caffeine, diazepam, baclofen, ondansetron, and ergocalciferol.  No orders of the defined types were placed in this encounter.    Follow-up: No follow-ups on file.  Walker Kehr, MD

## 2017-07-06 NOTE — Assessment & Plan Note (Signed)
Hard to take Vit D at high dose

## 2017-07-06 NOTE — Assessment & Plan Note (Signed)
F/u w/Dr Ennever 

## 2017-07-06 NOTE — Assessment & Plan Note (Signed)
Levothroid, Cytomel 

## 2017-07-06 NOTE — Assessment & Plan Note (Signed)
On B12 

## 2017-07-06 NOTE — Assessment & Plan Note (Signed)
Linzess 

## 2017-07-20 ENCOUNTER — Ambulatory Visit: Payer: 59 | Admitting: Internal Medicine

## 2017-08-10 DIAGNOSIS — Z79891 Long term (current) use of opiate analgesic: Secondary | ICD-10-CM | POA: Diagnosis not present

## 2017-08-10 DIAGNOSIS — G894 Chronic pain syndrome: Secondary | ICD-10-CM | POA: Diagnosis not present

## 2017-08-10 DIAGNOSIS — G259 Extrapyramidal and movement disorder, unspecified: Secondary | ICD-10-CM | POA: Diagnosis not present

## 2017-10-07 DIAGNOSIS — G894 Chronic pain syndrome: Secondary | ICD-10-CM | POA: Diagnosis not present

## 2017-10-07 DIAGNOSIS — G259 Extrapyramidal and movement disorder, unspecified: Secondary | ICD-10-CM | POA: Diagnosis not present

## 2017-10-07 DIAGNOSIS — Z79891 Long term (current) use of opiate analgesic: Secondary | ICD-10-CM | POA: Diagnosis not present

## 2017-11-03 ENCOUNTER — Encounter: Payer: Self-pay | Admitting: Internal Medicine

## 2017-11-03 ENCOUNTER — Telehealth: Payer: Self-pay | Admitting: Internal Medicine

## 2017-11-03 ENCOUNTER — Ambulatory Visit (INDEPENDENT_AMBULATORY_CARE_PROVIDER_SITE_OTHER): Payer: 59 | Admitting: Internal Medicine

## 2017-11-03 DIAGNOSIS — B351 Tinea unguium: Secondary | ICD-10-CM

## 2017-11-03 DIAGNOSIS — G2582 Stiff-man syndrome: Secondary | ICD-10-CM | POA: Diagnosis not present

## 2017-11-03 DIAGNOSIS — E034 Atrophy of thyroid (acquired): Secondary | ICD-10-CM

## 2017-11-03 DIAGNOSIS — E559 Vitamin D deficiency, unspecified: Secondary | ICD-10-CM

## 2017-11-03 DIAGNOSIS — G894 Chronic pain syndrome: Secondary | ICD-10-CM

## 2017-11-03 DIAGNOSIS — K5901 Slow transit constipation: Secondary | ICD-10-CM

## 2017-11-03 DIAGNOSIS — E538 Deficiency of other specified B group vitamins: Secondary | ICD-10-CM | POA: Diagnosis not present

## 2017-11-03 MED ORDER — BACLOFEN 10 MG PO TABS: 10.0000 mg | ORAL_TABLET | Freq: Two times a day (BID) | ORAL | 3 refills | Status: DC

## 2017-11-03 MED ORDER — DIAZEPAM 10 MG PO TABS: ORAL_TABLET | ORAL | 3 refills | Status: DC

## 2017-11-03 MED ORDER — ONDANSETRON HCL 8 MG PO TABS: ORAL_TABLET | ORAL | 3 refills | Status: DC

## 2017-11-03 MED ORDER — BUTALBITAL-APAP-CAFFEINE 50-325-40 MG PO TABS: 1.0000 | ORAL_TABLET | Freq: Four times a day (QID) | ORAL | 3 refills | Status: DC | PRN

## 2017-11-03 MED ORDER — CICLOPIROX 8 % EX SOLN: Freq: Every day | CUTANEOUS | 0 refills | Status: DC

## 2017-11-03 NOTE — Assessment & Plan Note (Signed)
Labs

## 2017-11-03 NOTE — Telephone Encounter (Signed)
Pt needs to request a new ref 2 weeks prior to the end of the last refill Thx

## 2017-11-03 NOTE — Assessment & Plan Note (Signed)
  On Levothroid, Cytomel

## 2017-11-03 NOTE — Assessment & Plan Note (Signed)
Lactulose prn 

## 2017-11-03 NOTE — Assessment & Plan Note (Signed)
The pt stopped Demerol (not being manufactured per pt) and started Dilaudid per Dr Hardin Negus. The pt chose not to take Dilaudid F/u w/Dr Hardin Negus

## 2017-11-03 NOTE — Assessment & Plan Note (Signed)
On B12 

## 2017-11-03 NOTE — Telephone Encounter (Signed)
Please advise 

## 2017-11-03 NOTE — Assessment & Plan Note (Signed)
[place]

## 2017-11-03 NOTE — Progress Notes (Signed)
Subjective:  Patient ID: Lisa Bruce, female    DOB: [birth year]  Age: 52 y.o. MRN: [id number]  CC: No chief complaint on file.   HPI MARLIA SCHEWE presents for spastic syndrome, chronic pain, anxiety f/u The pt stopped Demerol (not being manufactured per pt) and started Dilaudid per Dr Hardin Negus. The pt chose not to take Dilaudid  Outpatient Medications Prior to Visit  Medication Sig Dispense Refill  . baclofen (LIORESAL) 10 MG tablet Take 1 tablet (10 mg total) by mouth 2 (two) times daily. 180 tablet 3  . benzoyl peroxide 10 % gel Apply topically at bedtime.    . bisacodyl (DULCOLAX) 5 MG EC tablet Take by mouth.    . butalbital-acetaminophen-caffeine (FIORICET, ESGIC) 50-325-40 MG tablet Take 1 tablet by mouth every 6 (six) hours as needed for headache. 60 tablet 3  . ciclopirox (PENLAC) 8 % solution Apply topically at bedtime. Apply over nail and surrounding skin. Apply daily over previous coat. After seven (7) days, may remove with alcohol and continue cycle. 6.6 mL 0  . diazepam (VALIUM) 10 MG tablet TAKE 3 TABLETS AT 6AM, 3 TABLETS AT NOON, 3 TABLETS AT 5PM, 4 TABLETS AT BEDTIME 390 tablet 3  . ergocalciferol (VITAMIN D2) 50000 units capsule Take 1 capsule (50,000 Units total) by mouth once a week. 8 capsule 11  . ibuprofen (ADVIL,MOTRIN) 200 MG tablet Take by mouth.    . levothyroxine (LEVOTHROID) 137 MCG tablet Take 1 tablet (137 mcg total) by mouth daily before breakfast. 90 tablet 3  . linaclotide (LINZESS) 145 MCG CAPS capsule Take 1 capsule (145 mcg total) by mouth 1 day or 1 dose. 90 capsule 3  . liothyronine (CYTOMEL) 25 MCG tablet Take 0.5 tablets (12.5 mcg total) by mouth daily. 90 tablet 1  . morphine (MS CONTIN) 30 MG 12 hr tablet Take 1 tablet by mouth every 8 (eight) hours as needed.  0  . ondansetron (ZOFRAN ODT) 4 MG disintegrating tablet Take 1 tablet (4 mg total) by mouth every 8 (eight) hours as needed for nausea or vomiting. 20 tablet 2  . ondansetron  (ZOFRAN) 8 MG tablet TAKE 0.5-1 TABLETS (4-8 MG TOTAL) BY MOUTH EVERY 8 (EIGHT) HOURS AS NEEDED FOR NAUSEA OR VOMITING. 20 tablet 3  . ranitidine (ZANTAC) 150 MG tablet Take by mouth.    [name] tiZANidine (ZANAFLEX) 4 MG capsule Take by mouth.    . torsemide (DEMADEX) 20 MG tablet TAKE 1 TABLET BY MOUTH EVERY DAY AS NEEDED FOR FLUID 90 tablet 1  . meperidine (DEMEROL) 50 MG tablet Take 1 tablet (50 mg total) by mouth every 4 (four) hours as needed for moderate pain or severe pain. 150 tablet 0   No facility-administered medications prior to visit.     ROS: Review of Systems  Constitutional: Positive for fatigue. Negative for activity change, appetite change, chills and unexpected weight change.  HENT: Negative for congestion, mouth sores and sinus pressure.   Eyes: Negative for visual disturbance.  Respiratory: Negative for cough and chest tightness.   Gastrointestinal: Negative for abdominal pain and nausea.  Genitourinary: Negative for difficulty urinating, frequency and vaginal pain.  Musculoskeletal: Positive for arthralgias, back pain, gait problem, neck pain and neck stiffness.  Skin: Negative for pallor and rash.  Neurological: Positive for tremors and weakness. Negative for dizziness, numbness and headaches.  Psychiatric/Behavioral: Positive for decreased concentration, dysphoric mood and sleep disturbance. Negative for confusion and suicidal ideas. The patient is nervous/anxious.   falls asleep at  dinner  Objective:  BP 122/80 (BP Location: Left Arm, Patient Position: Sitting, Cuff Size: Normal)   Pulse 86   Temp 98 F (36.7 C) (Oral)   Ht 5\' 7"  (1.702 m)   Wt 185 lb (83.9 kg)   BMI 28.98 kg/m   BP Readings from Last 3 Encounters:  11/03/17 122/80  07/06/17 118/80  03/30/17 118/72    Wt Readings from Last 3 Encounters:  11/03/17 185 lb (83.9 kg)  07/06/17 184 lb (83.5 kg)  03/30/17 189 lb (85.7 kg)    Physical Exam  Constitutional: She appears well-developed. No  distress.  HENT:  Head: Normocephalic.  Right Ear: External ear normal.  Left Ear: External ear normal.  Nose: Nose normal.  Mouth/Throat: Oropharynx is clear and moist.  Eyes: Pupils are equal, round, and reactive to light. Conjunctivae are normal. Right eye exhibits no discharge. Left eye exhibits no discharge.  Neck: Normal range of motion. Neck supple. No JVD present. No tracheal deviation present. No thyromegaly present.  Cardiovascular: Normal rate, regular rhythm and normal heart sounds.  Pulmonary/Chest: No stridor. No respiratory distress. She has no wheezes.  Abdominal: Soft. Bowel sounds are normal. She exhibits no distension and no mass. There is no tenderness. There is no rebound and no guarding.  Musculoskeletal: She exhibits tenderness. She exhibits no edema.  Lymphadenopathy:    She has no cervical adenopathy.  Neurological: She displays normal reflexes. No cranial nerve deficit. She exhibits normal muscle tone. Coordination abnormal.  Skin: No rash noted. No erythema.  Psychiatric: She has a normal mood and affect. Her behavior is normal. Judgment and thought content normal.  tremor and spasms anxious  Lab Results  Component Value Date   WBC 5.3 03/30/2017   HGB 12.6 03/30/2017   HCT 36.9 03/30/2017   PLT 301.0 03/30/2017   GLUCOSE 101 (H) 03/30/2017   CHOL 269 (H) 05/22/2010   TRIG 129.0 05/22/2010   HDL 63.70 05/22/2010   LDLDIRECT 180.1 05/22/2010   ALT 24 03/30/2017   AST 24 03/30/2017   NA 132 (L) 03/30/2017   K 3.9 03/30/2017   CL 101 03/30/2017   CREATININE 0.85 03/30/2017   BUN 7 03/30/2017   CO2 23 03/30/2017   TSH 0.44 03/30/2017    Mr Abdomen W Wo Contrast  Result Date: 05/14/2015 CLINICAL DATA:  Abdominal pain with bloating and discomfort. There is a diagnosis of anterior abdominal wall mass in EPIC although it is unclear whether this is an indication for today's MRI. EXAM: MRI ABDOMEN WITHOUT AND WITH CONTRAST TECHNIQUE: Multiplanar  multisequence MR imaging of the abdomen was performed both before and after the administration of intravenous contrast. CONTRAST:  16 cc MultiHance COMPARISON:  PET-CT from 10/14/2005 FINDINGS: Lower chest:  Unremarkable. Hepatobiliary: No focal abnormality within the liver parenchyma. Extrahepatic common duct measures up to 8 mm in diameter in this patient status post cholecystectomy. Mild associated prominence of the left intrahepatic biliary ducts noted. The common bile duct in the head of the pancreas is 6 mm diameter. Pancreas: No focal mass lesion. No dilatation of the main duct. No intraparenchymal cyst. No peripancreatic edema. Spleen: No splenomegaly. No focal mass lesion. Adrenals/Urinary Tract: No adrenal nodule or mass. Kidneys have normal imaging features bilaterally. Stomach/Bowel: Stomach is nondistended. No gastric wall thickening. No evidence of outlet obstruction. Duodenum is normally positioned as is the ligament of Treitz. Visualize small bowel loops and colonic segments of the abdomen are normal in appearance. Vascular/Lymphatic: No abdominal aortic aneurysm. No evidence  for lymphadenopathy in the abdomen. Other: No intraperitoneal free fluid. Musculoskeletal: No abnormal marrow signal within the visualized bony anatomy. No evidence for anterior abdominal wall mass from the xiphoid process to umbilicus. IMPRESSION: Slight prominence of the left intrahepatic biliary ducts with mild distention of the extrahepatic common duct. These changes may be related to prior cholecystectomy, but correlation with liver function tests recommended. No evidence of anterior abdominal wall mass between the level of the xiphoid process and the umbilicus. Otherwise unremarkable exam with no features to explain the patient's history of pain and bloating. Electronically Signed   By: Misty Stanley M.D.   On: 05/14/2015 16:27    Assessment & Plan:   There are no diagnoses linked to this encounter.   No orders of  the defined types were placed in this encounter.    Follow-up: No follow-ups on file.  Walker Kehr, MD

## 2017-11-03 NOTE — Telephone Encounter (Signed)
Patient made appt for a 4 month fu instead of a 3 month fu due to December being a little busy for them.  Patient wanted to make sure that Dr. Alain Marion was ok with this and that he sends scripts for 4 month refills into her pharmacy.  Patient is requesting a call back in regard.

## 2017-11-04 DIAGNOSIS — G894 Chronic pain syndrome: Secondary | ICD-10-CM | POA: Diagnosis not present

## 2017-11-04 DIAGNOSIS — G259 Extrapyramidal and movement disorder, unspecified: Secondary | ICD-10-CM | POA: Diagnosis not present

## 2017-11-04 DIAGNOSIS — Z79891 Long term (current) use of opiate analgesic: Secondary | ICD-10-CM | POA: Diagnosis not present

## 2017-11-04 NOTE — Telephone Encounter (Signed)
Called pt to inform, was able to reach pt but pt was unable to hear me.

## 2017-11-18 DIAGNOSIS — E0789 Other specified disorders of thyroid: Secondary | ICD-10-CM | POA: Diagnosis not present

## 2017-11-24 DIAGNOSIS — E039 Hypothyroidism, unspecified: Secondary | ICD-10-CM | POA: Diagnosis not present

## 2017-12-01 DIAGNOSIS — G259 Extrapyramidal and movement disorder, unspecified: Secondary | ICD-10-CM | POA: Diagnosis not present

## 2017-12-01 DIAGNOSIS — Z79891 Long term (current) use of opiate analgesic: Secondary | ICD-10-CM | POA: Diagnosis not present

## 2017-12-01 DIAGNOSIS — G894 Chronic pain syndrome: Secondary | ICD-10-CM | POA: Diagnosis not present

## 2017-12-16 ENCOUNTER — Other Ambulatory Visit (INDEPENDENT_AMBULATORY_CARE_PROVIDER_SITE_OTHER): Payer: 59

## 2017-12-16 DIAGNOSIS — E034 Atrophy of thyroid (acquired): Secondary | ICD-10-CM | POA: Diagnosis not present

## 2017-12-16 DIAGNOSIS — G2582 Stiff-man syndrome: Secondary | ICD-10-CM

## 2017-12-16 DIAGNOSIS — G894 Chronic pain syndrome: Secondary | ICD-10-CM

## 2017-12-16 LAB — CBC WITH DIFFERENTIAL/PLATELET
BASOS ABS: 0 10*3/uL (ref 0.0–0.1)
Basophils Relative: 0.8 % (ref 0.0–3.0)
Eosinophils Absolute: 0.1 10*3/uL (ref 0.0–0.7)
Eosinophils Relative: 1.1 % (ref 0.0–5.0)
HEMATOCRIT: 35.5 % — AB (ref 36.0–46.0)
HEMOGLOBIN: 11.9 g/dL — AB (ref 12.0–15.0)
LYMPHS PCT: 47.8 % — AB (ref 12.0–46.0)
Lymphs Abs: 2.3 10*3/uL (ref 0.7–4.0)
MCHC: 33.6 g/dL (ref 30.0–36.0)
MCV: 91.7 fl (ref 78.0–100.0)
MONOS PCT: 8.3 % (ref 3.0–12.0)
Monocytes Absolute: 0.4 10*3/uL (ref 0.1–1.0)
NEUTROS ABS: 2.1 10*3/uL (ref 1.4–7.7)
Neutrophils Relative %: 42 % — ABNORMAL LOW (ref 43.0–77.0)
Platelets: 282 10*3/uL (ref 150.0–400.0)
RBC: 3.87 Mil/uL (ref 3.87–5.11)
RDW: 14.1 % (ref 11.5–15.5)
WBC: 4.9 10*3/uL (ref 4.0–10.5)

## 2017-12-16 LAB — TSH: TSH: 0.1 u[IU]/mL — ABNORMAL LOW (ref 0.35–4.50)

## 2017-12-17 LAB — BASIC METABOLIC PANEL
BUN: 9 mg/dL (ref 6–23)
CO2: 17 meq/L — AB (ref 19–32)
CREATININE: 0.99 mg/dL (ref 0.40–1.20)
Calcium: 8.8 mg/dL (ref 8.4–10.5)
Chloride: 103 mEq/L (ref 96–112)
GFR: 62.67 mL/min (ref >60.00)
Glucose, Bld: 86 mg/dL (ref 70–99)
Potassium: 3.9 mEq/L (ref 3.5–5.1)
Sodium: 132 mEq/L — ABNORMAL LOW (ref 135–145)

## 2017-12-17 LAB — LIPID PANEL
CHOL/HDL RATIO: 4
Cholesterol: 198 mg/dL (ref 0–200)
HDL: 47.9 mg/dL (ref >39.00)
LDL Cholesterol: 124 mg/dL — ABNORMAL HIGH (ref 0–99)
NonHDL: 150.05
TRIGLYCERIDES: 131 mg/dL (ref 0.0–149.0)
VLDL: 26.2 mg/dL (ref 0.0–40.0)

## 2017-12-17 LAB — HEPATIC FUNCTION PANEL
ALK PHOS: 102 U/L (ref 39–117)
ALT: 17 U/L (ref 0–35)
AST: 23 U/L (ref 0–37)
Albumin: 4.3 g/dL (ref 3.5–5.2)
BILIRUBIN DIRECT: 0.1 mg/dL (ref 0.0–0.3)
Total Bilirubin: 0.3 mg/dL (ref 0.2–1.2)
Total Protein: 7.6 g/dL (ref 6.0–8.3)

## 2017-12-17 LAB — IRON,TIBC AND FERRITIN PANEL
%SAT: 44 % (calc) (ref 16–45)
Ferritin: 9 ng/mL — ABNORMAL LOW (ref 16–232)
Iron: 136 ug/dL (ref 45–160)
TIBC: 306 mcg/dL (calc) (ref 250–450)

## 2017-12-17 LAB — VITAMIN D 25 HYDROXY (VIT D DEFICIENCY, FRACTURES): VITD: 15.3 ng/mL — ABNORMAL LOW (ref 30.00–100.00)

## 2017-12-17 LAB — VITAMIN B12: Vitamin B-12: 382 pg/mL (ref 211–911)

## 2017-12-27 ENCOUNTER — Encounter: Payer: Self-pay | Admitting: Internal Medicine

## 2017-12-27 ENCOUNTER — Ambulatory Visit (INDEPENDENT_AMBULATORY_CARE_PROVIDER_SITE_OTHER): Payer: 59 | Admitting: Internal Medicine

## 2017-12-27 DIAGNOSIS — E063 Autoimmune thyroiditis: Secondary | ICD-10-CM

## 2017-12-27 DIAGNOSIS — G44039 Episodic paroxysmal hemicrania, not intractable: Secondary | ICD-10-CM

## 2017-12-27 DIAGNOSIS — E559 Vitamin D deficiency, unspecified: Secondary | ICD-10-CM

## 2017-12-27 DIAGNOSIS — D508 Other iron deficiency anemias: Secondary | ICD-10-CM

## 2017-12-27 DIAGNOSIS — E538 Deficiency of other specified B group vitamins: Secondary | ICD-10-CM | POA: Diagnosis not present

## 2017-12-27 DIAGNOSIS — E034 Atrophy of thyroid (acquired): Secondary | ICD-10-CM

## 2017-12-27 DIAGNOSIS — Z23 Encounter for immunization: Secondary | ICD-10-CM | POA: Diagnosis not present

## 2017-12-27 DIAGNOSIS — G894 Chronic pain syndrome: Secondary | ICD-10-CM

## 2017-12-27 MED ORDER — LIOTHYRONINE SODIUM 25 MCG PO TABS: 12.5000 ug | ORAL_TABLET | Freq: Every day | ORAL | 1 refills | Status: DC

## 2017-12-27 MED ORDER — ONDANSETRON HCL 8 MG PO TABS: ORAL_TABLET | ORAL | 3 refills | Status: DC

## 2017-12-27 MED ORDER — BACLOFEN 10 MG PO TABS: 10.0000 mg | ORAL_TABLET | Freq: Two times a day (BID) | ORAL | 3 refills | Status: DC

## 2017-12-27 MED ORDER — DIAZEPAM 10 MG PO TABS: ORAL_TABLET | ORAL | 3 refills | Status: DC

## 2017-12-27 MED ORDER — LEVOTHYROXINE SODIUM 137 MCG PO TABS: 137.0000 ug | ORAL_TABLET | Freq: Every day | ORAL | 3 refills | Status: DC

## 2017-12-27 MED ORDER — BUTALBITAL-APAP-CAFFEINE 50-325-40 MG PO TABS: 1.0000 | ORAL_TABLET | Freq: Four times a day (QID) | ORAL | 3 refills | Status: DC | PRN

## 2017-12-27 MED ORDER — VITAMIN D3 1.25 MG (50000 UT) PO CAPS: 1.0000 | ORAL_CAPSULE | ORAL | 3 refills | Status: DC

## 2017-12-27 NOTE — Assessment & Plan Note (Signed)
MS contin Valium 9/19The pt stopped Demerol (not being manufactured per pt) and started Dilaudid per Dr Hardin Negus. The pt chose not to take Dilaudid F/u w/Dr Hardin Negus

## 2017-12-27 NOTE — Assessment & Plan Note (Signed)
The pt agreed to use 50000 iu q 14 d

## 2017-12-27 NOTE — Assessment & Plan Note (Signed)
Levothroid, Cytomel Trying to find another endocrinologist

## 2017-12-27 NOTE — Assessment & Plan Note (Signed)
Fioricet  Potential benefits of a long term Fioricet use as well as potential risks  and complications were explained to the patient and were aknowledged.

## 2017-12-27 NOTE — Assessment & Plan Note (Signed)
On B12 

## 2017-12-27 NOTE — Progress Notes (Signed)
Subjective:  Patient ID: Lisa Bruce, female    DOB: 10-14-1965  Age: 52 y.o. MRN: [id number]  CC: No chief complaint on file.   HPI Lisa Bruce presents for spastic disease, chronic pain, HAs, Vit D def - taking 400 iu   Outpatient Medications Prior to Visit  Medication Sig Dispense Refill  . baclofen (LIORESAL) 10 MG tablet Take 1 tablet (10 mg total) by mouth 2 (two) times daily. 180 tablet 3  . benzoyl peroxide 10 % gel Apply topically at bedtime.    . bisacodyl (DULCOLAX) 5 MG EC tablet Take by mouth.    . butalbital-acetaminophen-caffeine (FIORICET, ESGIC) 50-325-40 MG tablet Take 1 tablet by mouth every 6 (six) hours as needed for headache. 60 tablet 3  . ciclopirox (PENLAC) 8 % solution Apply topically at bedtime. Apply over nail and surrounding skin. Apply daily over previous coat. After seven (7) days, may remove with alcohol and continue cycle. 6.6 mL 0  . diazepam (VALIUM) 10 MG tablet TAKE 3 TABLETS AT 6AM, 3 TABLETS AT NOON, 3 TABLETS AT 5PM, 4 TABLETS AT BEDTIME 390 tablet 3  . ergocalciferol (VITAMIN D2) 50000 units capsule Take 1 capsule (50,000 Units total) by mouth once a week. 8 capsule 11  . ibuprofen (ADVIL,MOTRIN) 200 MG tablet Take by mouth.    . levothyroxine (LEVOTHROID) 137 MCG tablet Take 1 tablet (137 mcg total) by mouth daily before breakfast. 90 tablet 3  . linaclotide (LINZESS) 145 MCG CAPS capsule Take 1 capsule (145 mcg total) by mouth 1 day or 1 dose. 90 capsule 3  . liothyronine (CYTOMEL) 25 MCG tablet Take 0.5 tablets (12.5 mcg total) by mouth daily. 90 tablet 1  . morphine (MS CONTIN) 30 MG 12 hr tablet Take 1 tablet by mouth every 8 (eight) hours as needed.  0  . ondansetron (ZOFRAN) 8 MG tablet TAKE 0.5-1 TABLETS (4-8 MG TOTAL) BY MOUTH EVERY 8 (EIGHT) HOURS AS NEEDED FOR NAUSEA OR VOMITING. 20 tablet 3  . ranitidine (ZANTAC) 150 MG tablet Take by mouth.    [name] tiZANidine (ZANAFLEX) 4 MG capsule Take by mouth.    . torsemide (DEMADEX) 20 MG  tablet TAKE 1 TABLET BY MOUTH EVERY DAY AS NEEDED FOR FLUID 90 tablet 1   No facility-administered medications prior to visit.     ROS: Review of Systems  Constitutional: Negative for activity change, appetite change, chills, fatigue and unexpected weight change.  HENT: Negative for congestion, mouth sores and sinus pressure.   Eyes: Negative for visual disturbance.  Respiratory: Negative for cough and chest tightness.   Gastrointestinal: Negative for abdominal pain and nausea.  Genitourinary: Negative for difficulty urinating, frequency and vaginal pain.  Musculoskeletal: Positive for arthralgias, back pain, gait problem, joint swelling, myalgias, neck pain and neck stiffness.  Skin: Negative for pallor and rash.  Neurological: Negative for dizziness, tremors, weakness, numbness and headaches.  Psychiatric/Behavioral: Positive for dysphoric mood. Negative for confusion, sleep disturbance and suicidal ideas. The patient is nervous/anxious.     Objective:  BP 124/78 (BP Location: Left Arm, Patient Position: Sitting, Cuff Size: Normal)   Pulse 81   Temp 98.3 F (36.8 C) (Oral)   Ht 5\' 7"  (1.702 m)   Wt 176 lb (79.8 kg)   BMI 27.57 kg/m   BP Readings from Last 3 Encounters:  12/27/17 124/78  11/03/17 122/80  07/06/17 118/80    Wt Readings from Last 3 Encounters:  12/27/17 176 lb (79.8 kg)  11/03/17 185 lb (  83.9 kg)  07/06/17 184 lb (83.5 kg)    Physical Exam  Constitutional: She appears well-developed. No distress.  HENT:  Head: Normocephalic.  Right Ear: External ear normal.  Left Ear: External ear normal.  Nose: Nose normal.  Mouth/Throat: Oropharynx is clear and moist.  Eyes: Pupils are equal, round, and reactive to light. Conjunctivae are normal. Right eye exhibits no discharge. Left eye exhibits no discharge.  Neck: Normal range of motion. Neck supple. No JVD present. No tracheal deviation present. No thyromegaly present.  Cardiovascular: Normal rate, regular  rhythm and normal heart sounds.  Pulmonary/Chest: No stridor. No respiratory distress. She has no wheezes.  Abdominal: Soft. Bowel sounds are normal. She exhibits no distension and no mass. There is no tenderness. There is no rebound and no guarding.  Musculoskeletal: She exhibits tenderness. She exhibits no edema.  Lymphadenopathy:    She has no cervical adenopathy.  Neurological: She displays abnormal reflex. No cranial nerve deficit. She exhibits abnormal muscle tone. Coordination abnormal.  Skin: No rash noted. No erythema.  Psychiatric: Her behavior is normal. Judgment and thought content normal.  spastic shaking  Lab Results  Component Value Date   WBC 4.9 12/16/2017   HGB 11.9 (L) 12/16/2017   HCT 35.5 (L) 12/16/2017   PLT 282.0 12/16/2017   GLUCOSE 86 12/16/2017   CHOL 198 12/16/2017   TRIG 131.0 12/16/2017   HDL 47.90 12/16/2017   LDLDIRECT 180.1 05/22/2010   LDLCALC 124 (H) 12/16/2017   ALT 17 12/16/2017   AST 23 12/16/2017   NA 132 (L) 12/16/2017   K 3.9 12/16/2017   CL 103 12/16/2017   CREATININE 0.99 12/16/2017   BUN 9 12/16/2017   CO2 17 (L) 12/16/2017   TSH 0.10 (L) 12/16/2017    Mr Abdomen W Wo Contrast  Result Date: 05/14/2015 CLINICAL DATA:  Abdominal pain with bloating and discomfort. There is a diagnosis of anterior abdominal wall mass in EPIC although it is unclear whether this is an indication for today's MRI. EXAM: MRI ABDOMEN WITHOUT AND WITH CONTRAST TECHNIQUE: Multiplanar multisequence MR imaging of the abdomen was performed both before and after the administration of intravenous contrast. CONTRAST:  16 cc MultiHance COMPARISON:  PET-CT from 10/14/2005 FINDINGS: Lower chest:  Unremarkable. Hepatobiliary: No focal abnormality within the liver parenchyma. Extrahepatic common duct measures up to 8 mm in diameter in this patient status post cholecystectomy. Mild associated prominence of the left intrahepatic biliary ducts noted. The common bile duct in the  head of the pancreas is 6 mm diameter. Pancreas: No focal mass lesion. No dilatation of the main duct. No intraparenchymal cyst. No peripancreatic edema. Spleen: No splenomegaly. No focal mass lesion. Adrenals/Urinary Tract: No adrenal nodule or mass. Kidneys have normal imaging features bilaterally. Stomach/Bowel: Stomach is nondistended. No gastric wall thickening. No evidence of outlet obstruction. Duodenum is normally positioned as is the ligament of Treitz. Visualize small bowel loops and colonic segments of the abdomen are normal in appearance. Vascular/Lymphatic: No abdominal aortic aneurysm. No evidence for lymphadenopathy in the abdomen. Other: No intraperitoneal free fluid. Musculoskeletal: No abnormal marrow signal within the visualized bony anatomy. No evidence for anterior abdominal wall mass from the xiphoid process to umbilicus. IMPRESSION: Slight prominence of the left intrahepatic biliary ducts with mild distention of the extrahepatic common duct. These changes may be related to prior cholecystectomy, but correlation with liver function tests recommended. No evidence of anterior abdominal wall mass between the level of the xiphoid process and the umbilicus. Otherwise unremarkable  exam with no features to explain the patient's history of pain and bloating. Electronically Signed   By: Misty Stanley M.D.   On: 05/14/2015 16:27    Assessment & Plan:   There are no diagnoses linked to this encounter.   No orders of the defined types were placed in this encounter.    Follow-up: No follow-ups on file.  Walker Kehr, MD

## 2017-12-27 NOTE — Assessment & Plan Note (Addendum)
Levothroid, Cytomel combo helps the pt the best

## 2017-12-27 NOTE — Assessment & Plan Note (Signed)
CBC IV iron

## 2017-12-28 ENCOUNTER — Telehealth: Payer: Self-pay | Admitting: Internal Medicine

## 2017-12-28 MED ORDER — SYNTHROID 137 MCG PO TABS: 137.0000 ug | ORAL_TABLET | Freq: Every day | ORAL | 3 refills | Status: DC

## 2017-12-28 NOTE — Telephone Encounter (Signed)
Please advise 

## 2017-12-28 NOTE — Telephone Encounter (Signed)
Ok Synthroid. Thx

## 2017-12-28 NOTE — Telephone Encounter (Signed)
Copied from [place] [id number]. Topic: General - Other >> Dec 28, 2017 10:31 AM Lennox Solders wrote: Reason for CRM: pt saw dr plotnikov yesterday and she preferred to stay on synthroid 137 mcg instead of switching to levothyroxine if that's ok with doctor. [facility]

## 2017-12-29 NOTE — Telephone Encounter (Signed)
RX sent

## 2018-01-27 DIAGNOSIS — Z79891 Long term (current) use of opiate analgesic: Secondary | ICD-10-CM | POA: Diagnosis not present

## 2018-01-27 DIAGNOSIS — G259 Extrapyramidal and movement disorder, unspecified: Secondary | ICD-10-CM | POA: Diagnosis not present

## 2018-01-27 DIAGNOSIS — G894 Chronic pain syndrome: Secondary | ICD-10-CM | POA: Diagnosis not present

## 2018-02-26 ENCOUNTER — Other Ambulatory Visit: Payer: Self-pay | Admitting: Internal Medicine

## 2018-03-01 DIAGNOSIS — G259 Extrapyramidal and movement disorder, unspecified: Secondary | ICD-10-CM | POA: Diagnosis not present

## 2018-03-01 DIAGNOSIS — G894 Chronic pain syndrome: Secondary | ICD-10-CM | POA: Diagnosis not present

## 2018-03-01 DIAGNOSIS — Z79891 Long term (current) use of opiate analgesic: Secondary | ICD-10-CM | POA: Diagnosis not present

## 2018-03-01 MED ORDER — DIAZEPAM 10 MG PO TABS: ORAL_TABLET | ORAL | 3 refills | Status: DC

## 2018-03-01 NOTE — Addendum Note (Signed)
Addended by: Karren Cobble on: 03/01/2018 08:44 AM   Modules accepted: Orders

## 2018-03-02 ENCOUNTER — Ambulatory Visit: Payer: 59 | Admitting: Internal Medicine

## 2018-03-29 ENCOUNTER — Ambulatory Visit (INDEPENDENT_AMBULATORY_CARE_PROVIDER_SITE_OTHER): Payer: 59 | Admitting: Internal Medicine

## 2018-03-29 ENCOUNTER — Encounter: Payer: Self-pay | Admitting: Internal Medicine

## 2018-03-29 VITALS — BP 120/72 | HR 72 | Temp 97.7°F | Ht 67.0 in | Wt 174.0 lb

## 2018-03-29 DIAGNOSIS — G2582 Stiff-man syndrome: Secondary | ICD-10-CM

## 2018-03-29 DIAGNOSIS — E538 Deficiency of other specified B group vitamins: Secondary | ICD-10-CM | POA: Diagnosis not present

## 2018-03-29 DIAGNOSIS — E063 Autoimmune thyroiditis: Secondary | ICD-10-CM

## 2018-03-29 DIAGNOSIS — E038 Other specified hypothyroidism: Secondary | ICD-10-CM

## 2018-03-29 MED ORDER — SYNTHROID 137 MCG PO TABS: 137.0000 ug | ORAL_TABLET | Freq: Every day | ORAL | 3 refills | Status: DC

## 2018-03-29 MED ORDER — DIAZEPAM 10 MG PO TABS: ORAL_TABLET | ORAL | 4 refills | Status: DC

## 2018-03-29 MED ORDER — BUTALBITAL-APAP-CAFFEINE 50-325-40 MG PO TABS: 1.0000 | ORAL_TABLET | Freq: Four times a day (QID) | ORAL | 4 refills | Status: DC | PRN

## 2018-03-29 MED ORDER — ONDANSETRON HCL 8 MG PO TABS: ORAL_TABLET | ORAL | 3 refills | Status: DC

## 2018-03-29 MED ORDER — BACLOFEN 10 MG PO TABS: 10.0000 mg | ORAL_TABLET | Freq: Two times a day (BID) | ORAL | 3 refills | Status: DC

## 2018-03-29 MED ORDER — LIOTHYRONINE SODIUM 25 MCG PO TABS: 12.5000 ug | ORAL_TABLET | Freq: Every day | ORAL | 1 refills | Status: DC

## 2018-03-29 NOTE — Progress Notes (Signed)
Subjective:  Patient ID: Lisa Bruce, female    DOB: November 16, 1965  Age: 53 y.o. MRN: [id number]  CC: No chief complaint on file.   HPI Lisa Bruce presents for a spastic syndrome, pain, anxiety  Outpatient Medications Prior to Visit  Medication Sig Dispense Refill  . baclofen (LIORESAL) 10 MG tablet Take 1 tablet (10 mg total) by mouth 2 (two) times daily. 180 tablet 3  . benzoyl peroxide 10 % gel Apply topically at bedtime.    . bisacodyl (DULCOLAX) 5 MG EC tablet Take by mouth.    . butalbital-acetaminophen-caffeine (FIORICET, ESGIC) 50-325-40 MG tablet Take 1 tablet by mouth every 6 (six) hours as needed for headache. 60 tablet 3  . Cholecalciferol (VITAMIN D3) 1.25 MG (50000 UT) CAPS Take 1 capsule by mouth every 14 (fourteen) days. 6 capsule 3  . diazepam (VALIUM) 10 MG tablet TAKE 3 TABLETS AT 6AM, 3 TABLETS AT NOON, 3 TABLETS AT 5PM, 4 TABLETS AT BEDTIME 390 tablet 3  . ergocalciferol (VITAMIN D2) 50000 units capsule Take 1 capsule (50,000 Units total) by mouth once a week. 8 capsule 11  . ibuprofen (ADVIL,MOTRIN) 200 MG tablet Take by mouth.    . linaclotide (LINZESS) 145 MCG CAPS capsule Take 1 capsule (145 mcg total) by mouth 1 day or 1 dose. 90 capsule 3  . liothyronine (CYTOMEL) 25 MCG tablet Take 0.5 tablets (12.5 mcg total) by mouth daily. 90 tablet 1  . morphine (MS CONTIN) 30 MG 12 hr tablet Take 1 tablet by mouth every 8 (eight) hours as needed.  0  . morphine (MSIR) 15 MG tablet TAKE 1 TABLET BY MOUTH EVERY 6 HOURS AS DIRECTED    . ondansetron (ZOFRAN) 8 MG tablet TAKE 0.5-1 TABLETS (4-8 MG TOTAL) BY MOUTH EVERY 8 (EIGHT) HOURS AS NEEDED FOR NAUSEA OR VOMITING. 20 tablet 3  . ranitidine (ZANTAC) 150 MG tablet Take by mouth.    . SYNTHROID 137 MCG tablet Take 1 tablet (137 mcg total) by mouth daily before breakfast. 90 tablet 3  . tiZANidine (ZANAFLEX) 4 MG capsule Take by mouth.    . torsemide (DEMADEX) 20 MG tablet TAKE 1 TABLET BY MOUTH EVERY DAY AS NEEDED FOR  FLUID 90 tablet 1   No facility-administered medications prior to visit.     ROS: Review of Systems  Constitutional: Positive for fatigue. Negative for activity change, appetite change, chills and unexpected weight change.  HENT: Negative for congestion, mouth sores and sinus pressure.   Eyes: Negative for visual disturbance.  Respiratory: Negative for cough and chest tightness.   Gastrointestinal: Negative for abdominal pain and nausea.  Genitourinary: Negative for difficulty urinating, frequency and vaginal pain.  Musculoskeletal: Negative for back pain and gait problem.  Skin: Negative for pallor and rash.  Neurological: Negative for dizziness, tremors, weakness, numbness and headaches.  Psychiatric/Behavioral: Positive for dysphoric mood and sleep disturbance. Negative for confusion and suicidal ideas. The patient is nervous/anxious.     Objective:  BP 120/72 (BP Location: Left Arm, Patient Position: Sitting, Cuff Size: Normal)   Pulse 72   Temp 97.7 F (36.5 C) (Oral)   Ht 5\' 7"  (1.702 m)   Wt 174 lb (78.9 kg)   BMI 27.25 kg/m   BP Readings from Last 3 Encounters:  03/29/18 120/72  12/27/17 124/78  11/03/17 122/80    Wt Readings from Last 3 Encounters:  03/29/18 174 lb (78.9 kg)  12/27/17 176 lb (79.8 kg)  11/03/17 185 lb (83.9 kg)  Physical Exam Constitutional:      General: She is not in acute distress.    Appearance: She is well-developed.  HENT:     Head: Normocephalic.     Right Ear: External ear normal.     Left Ear: External ear normal.     Nose: Nose normal.  Eyes:     General:        Right eye: No discharge.        Left eye: No discharge.     Conjunctiva/sclera: Conjunctivae normal.     Pupils: Pupils are equal, round, and reactive to light.  Neck:     Musculoskeletal: Normal range of motion and neck supple.     Thyroid: No thyromegaly.     Vascular: No JVD.     Trachea: No tracheal deviation.  Cardiovascular:     Rate and Rhythm: Normal  rate and regular rhythm.     Heart sounds: Normal heart sounds.  Pulmonary:     Effort: No respiratory distress.     Breath sounds: No stridor. No wheezing.  Abdominal:     General: Bowel sounds are normal. There is no distension.     Palpations: Abdomen is soft. There is no mass.     Tenderness: There is no abdominal tenderness. There is no guarding or rebound.  Musculoskeletal:        General: Tenderness present.  Lymphadenopathy:     Cervical: No cervical adenopathy.  Skin:    Findings: No erythema or rash.  Neurological:     Cranial Nerves: No cranial nerve deficit.     Motor: No abnormal muscle tone.     Coordination: Coordination abnormal.     Gait: Gait abnormal.     Deep Tendon Reflexes: Reflexes normal.  Psychiatric:        Behavior: Behavior normal.        Thought Content: Thought content normal.        Judgment: Judgment normal.   tremor spasms dysarthria  Lab Results  Component Value Date   WBC 4.9 12/16/2017   HGB 11.9 (L) 12/16/2017   HCT 35.5 (L) 12/16/2017   PLT 282.0 12/16/2017   GLUCOSE 86 12/16/2017   CHOL 198 12/16/2017   TRIG 131.0 12/16/2017   HDL 47.90 12/16/2017   LDLDIRECT 180.1 05/22/2010   LDLCALC 124 (H) 12/16/2017   ALT 17 12/16/2017   AST 23 12/16/2017   NA 132 (L) 12/16/2017   K 3.9 12/16/2017   CL 103 12/16/2017   CREATININE 0.99 12/16/2017   BUN 9 12/16/2017   CO2 17 (L) 12/16/2017   TSH 0.10 (L) 12/16/2017    Mr Abdomen W Wo Contrast  Result Date: 05/14/2015 CLINICAL DATA:  Abdominal pain with bloating and discomfort. There is a diagnosis of anterior abdominal wall mass in EPIC although it is unclear whether this is an indication for today's MRI. EXAM: MRI ABDOMEN WITHOUT AND WITH CONTRAST TECHNIQUE: Multiplanar multisequence MR imaging of the abdomen was performed both before and after the administration of intravenous contrast. CONTRAST:  16 cc MultiHance COMPARISON:  PET-CT from 10/14/2005 FINDINGS: Lower chest:   Unremarkable. Hepatobiliary: No focal abnormality within the liver parenchyma. Extrahepatic common duct measures up to 8 mm in diameter in this patient status post cholecystectomy. Mild associated prominence of the left intrahepatic biliary ducts noted. The common bile duct in the head of the pancreas is 6 mm diameter. Pancreas: No focal mass lesion. No dilatation of the main duct. No intraparenchymal cyst. No peripancreatic  edema. Spleen: No splenomegaly. No focal mass lesion. Adrenals/Urinary Tract: No adrenal nodule or mass. Kidneys have normal imaging features bilaterally. Stomach/Bowel: Stomach is nondistended. No gastric wall thickening. No evidence of outlet obstruction. Duodenum is normally positioned as is the ligament of Treitz. Visualize small bowel loops and colonic segments of the abdomen are normal in appearance. Vascular/Lymphatic: No abdominal aortic aneurysm. No evidence for lymphadenopathy in the abdomen. Other: No intraperitoneal free fluid. Musculoskeletal: No abnormal marrow signal within the visualized bony anatomy. No evidence for anterior abdominal wall mass from the xiphoid process to umbilicus. IMPRESSION: Slight prominence of the left intrahepatic biliary ducts with mild distention of the extrahepatic common duct. These changes may be related to prior cholecystectomy, but correlation with liver function tests recommended. No evidence of anterior abdominal wall mass between the level of the xiphoid process and the umbilicus. Otherwise unremarkable exam with no features to explain the patient's history of pain and bloating. Electronically Signed   By: Misty Stanley M.D.   On: 05/14/2015 16:27    Assessment & Plan:   There are no diagnoses linked to this encounter.   No orders of the defined types were placed in this encounter.    Follow-up: No follow-ups on file.  Walker Kehr, MD

## 2018-03-29 NOTE — Assessment & Plan Note (Signed)
Pain meds per Dr Hardin Negus Diazepam, Fioricet

## 2018-03-29 NOTE — Assessment & Plan Note (Signed)
Levothroid, Cytomel

## 2018-03-29 NOTE — Assessment & Plan Note (Signed)
On B12 

## 2018-03-29 NOTE — Assessment & Plan Note (Signed)
Fioricet  Potential benefits of a long term Fioricet use as well as potential risks  and complications were explained to the patient and were aknowledged. RTC q 4 mo per pt's request

## 2018-04-27 DIAGNOSIS — G894 Chronic pain syndrome: Secondary | ICD-10-CM | POA: Diagnosis not present

## 2018-04-27 DIAGNOSIS — Z79891 Long term (current) use of opiate analgesic: Secondary | ICD-10-CM | POA: Diagnosis not present

## 2018-04-27 DIAGNOSIS — G259 Extrapyramidal and movement disorder, unspecified: Secondary | ICD-10-CM | POA: Diagnosis not present

## 2018-06-21 DIAGNOSIS — G894 Chronic pain syndrome: Secondary | ICD-10-CM | POA: Diagnosis not present

## 2018-06-21 DIAGNOSIS — G259 Extrapyramidal and movement disorder, unspecified: Secondary | ICD-10-CM | POA: Diagnosis not present

## 2018-06-21 DIAGNOSIS — Z79891 Long term (current) use of opiate analgesic: Secondary | ICD-10-CM | POA: Diagnosis not present

## 2018-07-28 ENCOUNTER — Ambulatory Visit (INDEPENDENT_AMBULATORY_CARE_PROVIDER_SITE_OTHER): Payer: 59 | Admitting: Internal Medicine

## 2018-07-28 ENCOUNTER — Encounter: Payer: Self-pay | Admitting: Internal Medicine

## 2018-07-28 DIAGNOSIS — M2560 Stiffness of unspecified joint, not elsewhere classified: Secondary | ICD-10-CM | POA: Diagnosis not present

## 2018-07-28 DIAGNOSIS — G2582 Stiff-man syndrome: Secondary | ICD-10-CM

## 2018-07-28 DIAGNOSIS — B351 Tinea unguium: Secondary | ICD-10-CM

## 2018-07-28 DIAGNOSIS — E559 Vitamin D deficiency, unspecified: Secondary | ICD-10-CM | POA: Diagnosis not present

## 2018-07-28 DIAGNOSIS — E538 Deficiency of other specified B group vitamins: Secondary | ICD-10-CM

## 2018-07-28 MED ORDER — BACLOFEN 10 MG PO TABS: 10.0000 mg | ORAL_TABLET | Freq: Two times a day (BID) | ORAL | 4 refills | Status: DC

## 2018-07-28 MED ORDER — BUTALBITAL-APAP-CAFFEINE 50-325-40 MG PO TABS: 1.0000 | ORAL_TABLET | Freq: Four times a day (QID) | ORAL | 4 refills | Status: DC | PRN

## 2018-07-28 MED ORDER — CICLOPIROX 8 % EX SOLN: Freq: Every day | CUTANEOUS | 2 refills | Status: DC

## 2018-07-28 MED ORDER — ONDANSETRON HCL 8 MG PO TABS: ORAL_TABLET | ORAL | 3 refills | Status: DC

## 2018-07-28 MED ORDER — DIAZEPAM 10 MG PO TABS: ORAL_TABLET | ORAL | 4 refills | Status: DC

## 2018-07-28 MED ORDER — SYNTHROID 137 MCG PO TABS: 137.0000 ug | ORAL_TABLET | Freq: Every day | ORAL | 3 refills | Status: DC

## 2018-07-28 MED ORDER — LIOTHYRONINE SODIUM 25 MCG PO TABS: 12.5000 ug | ORAL_TABLET | Freq: Every day | ORAL | 3 refills | Status: DC

## 2018-07-28 NOTE — Assessment & Plan Note (Signed)
Diazepam Baclofen

## 2018-07-28 NOTE — Assessment & Plan Note (Signed)
Baclofen Diazepam

## 2018-07-28 NOTE — Assessment & Plan Note (Signed)
Vit B12 

## 2018-07-28 NOTE — Assessment & Plan Note (Signed)
Resume [place]

## 2018-07-28 NOTE — Progress Notes (Signed)
Virtual Visit via Video Note  I connected with Viann Shove on 07/28/18 at  3:40 PM EDT by a video enabled telemedicine application and verified that I am speaking with the correct person using two identifiers.   I discussed the limitations of evaluation and management by telemedicine and the availability of in person appointments. The patient expressed understanding and agreed to proceed.  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   Observations/Objective: The patient appears to be in no acute distress. Stiff. Tremor present  Assessment and Plan:  See my Assessment and Plan. Follow Up Instructions:    I discussed the assessment and treatment plan with the patient. The patient was provided an opportunity to ask questions and all were answered. The patient agreed with the plan and demonstrated an understanding of the instructions.   The patient was advised to call back or seek an in-person evaluation if the symptoms worsen or if the condition fails to improve as anticipated.  I provided face-to-face time during this encounter. We were at different locations.   Walker Kehr, MD

## 2018-07-28 NOTE — Assessment & Plan Note (Signed)
Vit D 

## 2018-08-31 ENCOUNTER — Telehealth: Payer: Self-pay | Admitting: Internal Medicine

## 2018-08-31 DIAGNOSIS — R109 Unspecified abdominal pain: Secondary | ICD-10-CM

## 2018-08-31 NOTE — Telephone Encounter (Signed)
Pt's husband called to request referral for pt to GI due to the following:   Pt reports to husband:  abd pain following eating despite diet changes Constipation/bloating despite diet changes Weight loss Loss of appetite  Sx have been going on since approx Feb 2020, but have worsened over the past few weeks.  Please call husband to discuss. [place]

## 2018-08-31 NOTE — Telephone Encounter (Signed)
Please advise about GI referral

## 2018-08-31 NOTE — Telephone Encounter (Signed)
Pts husband called to get update on this referral. Pts husband states he would like to get an appt with GI today. Please advise .

## 2018-09-01 NOTE — Telephone Encounter (Signed)
Ok Thx 

## 2018-09-01 NOTE — Telephone Encounter (Signed)
spouse notified, referral placed

## 2018-09-02 ENCOUNTER — Other Ambulatory Visit: Payer: Self-pay | Admitting: Physician Assistant

## 2018-09-02 DIAGNOSIS — R630 Anorexia: Secondary | ICD-10-CM

## 2018-09-02 DIAGNOSIS — R490 Dysphonia: Secondary | ICD-10-CM

## 2018-09-02 DIAGNOSIS — R1314 Dysphagia, pharyngoesophageal phase: Secondary | ICD-10-CM

## 2018-09-06 ENCOUNTER — Other Ambulatory Visit: Payer: Self-pay | Admitting: Physician Assistant

## 2018-09-06 ENCOUNTER — Ambulatory Visit
Admission: RE | Admit: 2018-09-06 | Discharge: 2018-09-06 | Disposition: A | Discharge status: Home / Self Care | Payer: 59 | Source: Ambulatory Visit | Attending: Physician Assistant | Admitting: Physician Assistant

## 2018-09-06 DIAGNOSIS — K5909 Other constipation: Secondary | ICD-10-CM

## 2018-09-07 ENCOUNTER — Ambulatory Visit
Admission: RE | Admit: 2018-09-07 | Discharge: 2018-09-07 | Disposition: A | Discharge status: Home / Self Care | Payer: 59 | Source: Ambulatory Visit | Attending: Physician Assistant | Admitting: Physician Assistant

## 2018-09-07 ENCOUNTER — Other Ambulatory Visit: Payer: Self-pay

## 2018-09-07 DIAGNOSIS — R1314 Dysphagia, pharyngoesophageal phase: Secondary | ICD-10-CM

## 2018-09-07 DIAGNOSIS — R630 Anorexia: Secondary | ICD-10-CM

## 2018-09-07 DIAGNOSIS — R490 Dysphonia: Secondary | ICD-10-CM

## 2018-11-02 ENCOUNTER — Telehealth: Payer: Self-pay

## 2018-11-02 DIAGNOSIS — G2582 Stiff-man syndrome: Secondary | ICD-10-CM

## 2018-11-02 DIAGNOSIS — R5382 Chronic fatigue, unspecified: Secondary | ICD-10-CM

## 2018-11-02 DIAGNOSIS — E559 Vitamin D deficiency, unspecified: Secondary | ICD-10-CM

## 2018-11-02 DIAGNOSIS — Z0000 Encounter for general adult medical examination without abnormal findings: Secondary | ICD-10-CM

## 2018-11-02 DIAGNOSIS — E538 Deficiency of other specified B group vitamins: Secondary | ICD-10-CM

## 2018-11-02 NOTE — Telephone Encounter (Signed)
I will order labs.  Thanks

## 2018-11-02 NOTE — Telephone Encounter (Signed)
Copied from [place] [phone]. Topic: General - Inquiry >> Nov 02, 2018  3:05 PM Scherrie Gerlach wrote: Reason for CRM: pt would like to know if dr Alain Marion is going to order blood work for her visit 9/22.(virtual)  Is so she needs to do this week.  Pt has had other issues and seen a GI dr.  She would like a call back asap to advise. Pt states dr did not do labs last visit.  Please try both numbers if you do not get on one.

## 2018-11-03 NOTE — Telephone Encounter (Signed)
Pt.notified

## 2018-11-04 ENCOUNTER — Telehealth: Payer: Self-pay | Admitting: Internal Medicine

## 2018-11-04 ENCOUNTER — Other Ambulatory Visit (INDEPENDENT_AMBULATORY_CARE_PROVIDER_SITE_OTHER): Payer: 59

## 2018-11-04 DIAGNOSIS — Z0000 Encounter for general adult medical examination without abnormal findings: Secondary | ICD-10-CM | POA: Diagnosis not present

## 2018-11-04 DIAGNOSIS — R5382 Chronic fatigue, unspecified: Secondary | ICD-10-CM

## 2018-11-04 DIAGNOSIS — E538 Deficiency of other specified B group vitamins: Secondary | ICD-10-CM

## 2018-11-04 DIAGNOSIS — E559 Vitamin D deficiency, unspecified: Secondary | ICD-10-CM

## 2018-11-04 LAB — HEPATIC FUNCTION PANEL
ALT: 38 U/L — ABNORMAL HIGH (ref 0–35)
AST: 37 U/L (ref 0–37)
Albumin: 3.9 g/dL (ref 3.5–5.2)
Alkaline Phosphatase: 97 U/L (ref 39–117)
Bilirubin, Direct: 0 mg/dL (ref 0.0–0.3)
Total Bilirubin: 0.4 mg/dL (ref 0.2–1.2)
Total Protein: 7 g/dL (ref 6.0–8.3)

## 2018-11-04 LAB — CBC WITH DIFFERENTIAL/PLATELET
Basophils Absolute: 0 10*3/uL (ref 0.0–0.1)
Basophils Relative: 0.7 % (ref 0.0–3.0)
Eosinophils Absolute: 0.1 10*3/uL (ref 0.0–0.7)
Eosinophils Relative: 1.9 % (ref 0.0–5.0)
HCT: 34.9 % — ABNORMAL LOW (ref 36.0–46.0)
Hemoglobin: 11.9 g/dL — ABNORMAL LOW (ref 12.0–15.0)
Lymphocytes Relative: 55.9 % — ABNORMAL HIGH (ref 12.0–46.0)
Lymphs Abs: 2.6 10*3/uL (ref 0.7–4.0)
MCHC: 34.2 g/dL (ref 30.0–36.0)
MCV: 91.6 fl (ref 78.0–100.0)
Monocytes Absolute: 0.3 10*3/uL (ref 0.1–1.0)
Monocytes Relative: 6.3 % (ref 3.0–12.0)
Neutro Abs: 1.7 10*3/uL (ref 1.4–7.7)
Neutrophils Relative %: 35.2 % — ABNORMAL LOW (ref 43.0–77.0)
Platelets: 244 10*3/uL (ref 150.0–400.0)
RBC: 3.8 Mil/uL — ABNORMAL LOW (ref 3.87–5.11)
RDW: 14 % (ref 11.5–15.5)
WBC: 4.7 10*3/uL (ref 4.0–10.5)

## 2018-11-04 LAB — LIPID PANEL
Cholesterol: 188 mg/dL (ref 0–200)
HDL: 58.6 mg/dL (ref >39.00)
LDL Cholesterol: 108 mg/dL — ABNORMAL HIGH (ref 0–99)
NonHDL: 129.64
Total CHOL/HDL Ratio: 3
Triglycerides: 110 mg/dL (ref 0.0–149.0)
VLDL: 22 mg/dL (ref 0.0–40.0)

## 2018-11-04 LAB — TSH: TSH: <0.01 u[IU]/mL — ABNORMAL LOW (ref 0.35–4.50)

## 2018-11-04 LAB — BASIC METABOLIC PANEL
BUN: 9 mg/dL (ref 6–23)
CO2: 25 mEq/L (ref 19–32)
Calcium: 8.9 mg/dL (ref 8.4–10.5)
Chloride: 103 mEq/L (ref 96–112)
Creatinine, Ser: 0.77 mg/dL (ref 0.40–1.20)
GFR: 78.53 mL/min (ref >60.00)
Glucose, Bld: 85 mg/dL (ref 70–99)
Potassium: 4 mEq/L (ref 3.5–5.1)
Sodium: 135 mEq/L (ref 135–145)

## 2018-11-04 LAB — VITAMIN D 25 HYDROXY (VIT D DEFICIENCY, FRACTURES): VITD: 61.18 ng/mL (ref 30.00–100.00)

## 2018-11-04 LAB — VITAMIN B12: Vitamin B-12: 1084 pg/mL — ABNORMAL HIGH (ref 211–911)

## 2018-11-04 NOTE — Telephone Encounter (Signed)
Patient had labs completed today.  She would like results either faxed to [phone] or emailed to [email or web address] once they are resulted in order to review at her [facility] appt on Tuesday.

## 2018-11-05 LAB — IRON,TIBC AND FERRITIN PANEL
%SAT: 34 % (calc) (ref 16–45)
Ferritin: 8 ng/mL — ABNORMAL LOW (ref 16–232)
Iron: 95 ug/dL (ref 45–160)
TIBC: 277 mcg/dL (calc) (ref 250–450)

## 2018-11-07 NOTE — Telephone Encounter (Signed)
Pt called and would like the fax sent to [phone] Attn:  Freida Busman

## 2018-11-07 NOTE — Telephone Encounter (Signed)
faxed

## 2018-11-08 ENCOUNTER — Encounter: Payer: Self-pay | Admitting: Internal Medicine

## 2018-11-08 ENCOUNTER — Ambulatory Visit (INDEPENDENT_AMBULATORY_CARE_PROVIDER_SITE_OTHER): Payer: 59 | Admitting: Internal Medicine

## 2018-11-08 ENCOUNTER — Other Ambulatory Visit (INDEPENDENT_AMBULATORY_CARE_PROVIDER_SITE_OTHER): Payer: 59

## 2018-11-08 VITALS — BP 110/66 | HR 64 | Temp 98.0°F | Ht 67.0 in | Wt 141.0 lb

## 2018-11-08 DIAGNOSIS — G2582 Stiff-man syndrome: Secondary | ICD-10-CM

## 2018-11-08 DIAGNOSIS — G894 Chronic pain syndrome: Secondary | ICD-10-CM

## 2018-11-08 DIAGNOSIS — E063 Autoimmune thyroiditis: Secondary | ICD-10-CM

## 2018-11-08 DIAGNOSIS — K5901 Slow transit constipation: Secondary | ICD-10-CM | POA: Diagnosis not present

## 2018-11-08 DIAGNOSIS — E038 Other specified hypothyroidism: Secondary | ICD-10-CM

## 2018-11-08 DIAGNOSIS — E559 Vitamin D deficiency, unspecified: Secondary | ICD-10-CM

## 2018-11-08 DIAGNOSIS — E538 Deficiency of other specified B group vitamins: Secondary | ICD-10-CM

## 2018-11-08 LAB — T4, FREE: Free T4: 0.97 ng/dL (ref 0.60–1.60)

## 2018-11-08 LAB — T3, FREE: T3, Free: 3.8 pg/mL (ref 2.3–4.2)

## 2018-11-08 MED ORDER — DIAZEPAM 10 MG PO TABS: ORAL_TABLET | ORAL | 4 refills | Status: DC

## 2018-11-08 MED ORDER — ONDANSETRON HCL 8 MG PO TABS: ORAL_TABLET | ORAL | 3 refills | Status: DC

## 2018-11-08 MED ORDER — LIOTHYRONINE SODIUM 25 MCG PO TABS: 12.5000 ug | ORAL_TABLET | Freq: Every day | ORAL | 3 refills | Status: DC

## 2018-11-08 MED ORDER — BUTALBITAL-APAP-CAFFEINE 50-325-40 MG PO TABS: 1.0000 | ORAL_TABLET | Freq: Four times a day (QID) | ORAL | 4 refills | Status: DC | PRN

## 2018-11-08 MED ORDER — SYNTHROID 137 MCG PO TABS: 137.0000 ug | ORAL_TABLET | Freq: Every day | ORAL | 3 refills | Status: DC

## 2018-11-08 MED ORDER — BACLOFEN 10 MG PO TABS: 10.0000 mg | ORAL_TABLET | Freq: Two times a day (BID) | ORAL | 4 refills | Status: DC

## 2018-11-08 NOTE — Progress Notes (Signed)
Subjective:  Patient ID: Lisa Bruce, female    DOB: Jun 21, 1965  Age: 54 y.o. MRN: [id number]  CC: No chief complaint on file.   HPI Lisa Bruce presents for chronic spasms, constipation, HAs, hypothyroidism f/u. The pt saw Dr Lisa Bruce  Outpatient Medications Prior to Visit  Medication Sig Dispense Refill  . baclofen (LIORESAL) 10 MG tablet Take 1 tablet (10 mg total) by mouth 2 (two) times daily. 180 tablet 4  . benzoyl peroxide 10 % gel Apply topically at bedtime.    . bisacodyl (DULCOLAX) 5 MG EC tablet Take by mouth.    . butalbital-acetaminophen-caffeine (FIORICET) 50-325-40 MG tablet Take 1 tablet by mouth every 6 (six) hours as needed for headache. 60 tablet 4  . Cholecalciferol (VITAMIN D3) 1.25 MG (50000 UT) CAPS Take 1 capsule by mouth every 14 (fourteen) days. 6 capsule 3  . ciclopirox (PENLAC) 8 % solution Apply topically at bedtime. Apply over nail and surrounding skin. Apply daily over previous coat. After seven (7) days, may remove with alcohol and continue cycle. 6.6 mL 2  . diazepam (VALIUM) 10 MG tablet TAKE 3 TABLETS AT 6AM, 3 TABLETS AT NOON, 3 TABLETS AT 5PM, 4 TABLETS AT BEDTIME 390 tablet 4  . ergocalciferol (VITAMIN D2) 50000 units capsule Take 1 capsule (50,000 Units total) by mouth once a week. 8 capsule 11  . ibuprofen (ADVIL,MOTRIN) 200 MG tablet Take by mouth.    . linaclotide (LINZESS) 145 MCG CAPS capsule Take 1 capsule (145 mcg total) by mouth 1 day or 1 dose. 90 capsule 3  . liothyronine (CYTOMEL) 25 MCG tablet Take 0.5 tablets (12.5 mcg total) by mouth daily. 90 tablet 3  . morphine (MS CONTIN) 30 MG 12 hr tablet Take 1 tablet by mouth every 8 (eight) hours as needed.  0  . morphine (MSIR) 15 MG tablet TAKE 1 TABLET BY MOUTH EVERY 6 HOURS AS DIRECTED    . ondansetron (ZOFRAN) 8 MG tablet TAKE 0.5-1 TABLETS (4-8 MG TOTAL) BY MOUTH EVERY 8 (EIGHT) HOURS AS NEEDED FOR NAUSEA OR VOMITING. 20 tablet 3  . ranitidine (ZANTAC) 150 MG tablet Take by mouth.     . SYNTHROID 137 MCG tablet Take 1 tablet (137 mcg total) by mouth daily before breakfast. 90 tablet 3  . tiZANidine (ZANAFLEX) 4 MG capsule Take by mouth.    . torsemide (DEMADEX) 20 MG tablet TAKE 1 TABLET BY MOUTH EVERY DAY AS NEEDED FOR FLUID 90 tablet 1   No facility-administered medications prior to visit.     ROS: Review of Systems  Constitutional: Positive for fatigue. Negative for activity change, appetite change, chills and unexpected weight change.  HENT: Negative for congestion, mouth sores and sinus pressure.   Eyes: Negative for visual disturbance.  Respiratory: Negative for cough and chest tightness.   Gastrointestinal: Positive for abdominal distention, abdominal pain, constipation and nausea.  Genitourinary: Negative for difficulty urinating, frequency and vaginal pain.  Musculoskeletal: Positive for arthralgias, gait problem and neck stiffness. Negative for back pain.  Skin: Negative for pallor and rash.  Neurological: Positive for tremors. Negative for dizziness, weakness, numbness and headaches.  Psychiatric/Behavioral: Positive for decreased concentration, dysphoric mood and sleep disturbance. Negative for confusion and suicidal ideas. The patient is nervous/anxious.     Objective:  BP 110/66 (BP Location: Left Arm, Patient Position: Sitting, Cuff Size: Normal)   Pulse 64   Temp 98 F (36.7 C) (Oral)   Ht 5\' 7"  (1.702 m)   Wt 141  lb (64 kg)   BMI 22.08 kg/m   BP Readings from Last 3 Encounters:  11/08/18 110/66  03/29/18 120/72  12/27/17 124/78    Wt Readings from Last 3 Encounters:  11/08/18 141 lb (64 kg)  03/29/18 174 lb (78.9 kg)  12/27/17 176 lb (79.8 kg)    Physical Exam Constitutional:      General: She is not in acute distress.    Appearance: She is well-developed.  HENT:     Head: Normocephalic.     Right Ear: External ear normal.     Left Ear: External ear normal.     Nose: Nose normal.  Eyes:     General:        Right eye: No  discharge.        Left eye: No discharge.     Conjunctiva/sclera: Conjunctivae normal.     Pupils: Pupils are equal, round, and reactive to light.  Neck:     Musculoskeletal: Normal range of motion and neck supple.     Thyroid: No thyromegaly.     Vascular: No JVD.     Trachea: No tracheal deviation.  Cardiovascular:     Rate and Rhythm: Normal rate and regular rhythm.     Heart sounds: Normal heart sounds.  Pulmonary:     Effort: No respiratory distress.     Breath sounds: No stridor. No wheezing.  Abdominal:     General: Bowel sounds are normal. There is no distension.     Palpations: Abdomen is soft. There is no mass.     Tenderness: There is abdominal tenderness. There is no guarding or rebound.  Musculoskeletal:        General: Tenderness present.  Lymphadenopathy:     Cervical: No cervical adenopathy.  Skin:    Findings: No erythema or rash.  Neurological:     Mental Status: She is oriented to person, place, and time.     Cranial Nerves: No cranial nerve deficit.     Motor: No abnormal muscle tone.     Coordination: Coordination abnormal.     Gait: Gait abnormal.     Deep Tendon Reflexes: Reflexes normal.  Psychiatric:        Behavior: Behavior normal.        Thought Content: Thought content normal.        Judgment: Judgment normal.     Lab Results  Component Value Date   WBC 4.7 11/04/2018   HGB 11.9 (L) 11/04/2018   HCT 34.9 (L) 11/04/2018   PLT 244.0 11/04/2018   GLUCOSE 85 11/04/2018   CHOL 188 11/04/2018   TRIG 110.0 11/04/2018   HDL 58.60 11/04/2018   LDLDIRECT 180.1 05/22/2010   LDLCALC 108 (H) 11/04/2018   ALT 38 (H) 11/04/2018   AST 37 11/04/2018   NA 135 11/04/2018   K 4.0 11/04/2018   CL 103 11/04/2018   CREATININE 0.77 11/04/2018   BUN 9 11/04/2018   CO2 25 11/04/2018   TSH <0.01 (L) 11/04/2018    Dg Ugi W Double Cm (hd Ba)  Result Date: 09/07/2018 CLINICAL DATA:  Pharyngeal esophageal dysphagia.  Loss of appetite. EXAM: UPPER GI  SERIES WITHOUT KUB TECHNIQUE: Routine upper GI series was performed with thin/high density/water soluble barium. FLUOROSCOPY TIME:  Fluoroscopy Time:  5 minutes and 48 seconds. Radiation Exposure Index (if provided by the fluoroscopic device): 273 mGy Number of Acquired Spot Images: COMPARISON:  Two-view abdomen 09/06/2018 FINDINGS: Frontal and lateral views of the hypopharynx while swallowing  are remarkable for laryngeal penetration to the level of the vocal cords on 1 of the initial swallows. No frank aspiration. No episodes of aspiration were observed during the remainder of the exam. Due to limitations related to the patient's clinical status/limited mobility, single contrast study was performed. Despite single-contrast study, upright assessment of the esophagus generates a double-contrast appearance due to spontaneous swallowing of air. There is no evidence for esophageal diverticulum, esophageal mass lesion, or gross mucosal ulceration. Mild narrowing of the distal esophagus evident. A 13 mm barium tablet lodges at this area of smooth mild narrowing in the distal esophagus. Patient was given 5 alternating swallows of thin barium and water and the tablet did ultimately pass into the stomach on the fifth attempt while drinking water. Assessment of esophageal motility shows preservation of the primary peristaltic stripping wave on 4/5 swallows with some minimal proximal escape. Mild tertiary contractions noted in the distal third of the esophagus. No evidence for presbyesophagus. No evidence for hiatal hernia. Single contrast imaging of the stomach with spot compression shows no gastric mass lesion or gross mucosal ulceration. Gastric emptying is prompt. Pylorus and duodenal bulb are normal. By 15 minutes after administration of the first swallow of barium, no migration of barium beyond the ligament of Treitz is observed. IMPRESSION: 1. Gradual, smooth, mild narrowing of the distal esophagus just proximal to the  esophagogastric junction. A 13 mm barium tablet becomes lodged at this location through multiple repeat swallows of thin barium and water before passing into the stomach. 2. Good preservation of primary peristaltic stripping wave with some tertiary contractions noted in the distal third of the esophagus. 3. Despite prompt gastric emptying, sluggish peristalsis noted in the duodenum. By 15 minutes after starting the procedure, no barium had migrated into the jejunum. 4. Stomach is normally positioned without evidence of hiatal hernia. No gross ulceration or mass lesion on single contrast imaging. 5. Laryngeal penetration noted on initial swallows of thick barium with penetration to the level of the vocal cords. No frank aspiration below the level of the cords. No subsequent episodes of aspiration were observed. Electronically Signed   By: Misty Stanley M.D.   On: 09/07/2018 09:33    Assessment & Plan:   There are no diagnoses linked to this encounter.   No orders of the defined types were placed in this encounter.    Follow-up: No follow-ups on file.  Walker Kehr, MD

## 2018-11-08 NOTE — Assessment & Plan Note (Signed)
Vit D 

## 2018-11-08 NOTE — Assessment & Plan Note (Signed)
On B12 

## 2018-11-08 NOTE — Assessment & Plan Note (Signed)
Chronic pain 

## 2018-11-08 NOTE — Assessment & Plan Note (Signed)
Miralax, Align, Linzess

## 2018-11-08 NOTE — Assessment & Plan Note (Addendum)
Levothroid, Cytomel combo helps the pt the best FT4 and FT3 today

## 2018-11-08 NOTE — Assessment & Plan Note (Signed)
On meds

## 2019-02-08 ENCOUNTER — Other Ambulatory Visit: Payer: Self-pay | Admitting: Internal Medicine

## 2019-03-07 ENCOUNTER — Other Ambulatory Visit: Payer: Self-pay

## 2019-03-07 ENCOUNTER — Encounter: Payer: Self-pay | Admitting: Internal Medicine

## 2019-03-07 ENCOUNTER — Ambulatory Visit (INDEPENDENT_AMBULATORY_CARE_PROVIDER_SITE_OTHER): Payer: 59 | Admitting: Internal Medicine

## 2019-03-07 DIAGNOSIS — E538 Deficiency of other specified B group vitamins: Secondary | ICD-10-CM

## 2019-03-07 DIAGNOSIS — E038 Other specified hypothyroidism: Secondary | ICD-10-CM | POA: Diagnosis not present

## 2019-03-07 DIAGNOSIS — G2582 Stiff-man syndrome: Secondary | ICD-10-CM

## 2019-03-07 DIAGNOSIS — E559 Vitamin D deficiency, unspecified: Secondary | ICD-10-CM | POA: Diagnosis not present

## 2019-03-07 MED ORDER — DIAZEPAM 10 MG PO TABS: ORAL_TABLET | ORAL | 4 refills | Status: DC

## 2019-03-07 MED ORDER — LEVOTHYROXINE SODIUM 137 MCG PO TABS: 137.0000 ug | ORAL_TABLET | Freq: Every day | ORAL | 3 refills | Status: DC

## 2019-03-07 MED ORDER — LIOTHYRONINE SODIUM 25 MCG PO TABS: 12.5000 ug | ORAL_TABLET | Freq: Every day | ORAL | 3 refills | Status: DC

## 2019-03-07 MED ORDER — BACLOFEN 10 MG PO TABS: 10.0000 mg | ORAL_TABLET | Freq: Two times a day (BID) | ORAL | 3 refills | Status: DC

## 2019-03-07 MED ORDER — ONDANSETRON HCL 8 MG PO TABS: ORAL_TABLET | ORAL | 3 refills | Status: DC

## 2019-03-07 MED ORDER — BUTALBITAL-APAP-CAFFEINE 50-325-40 MG PO TABS: 1.0000 | ORAL_TABLET | Freq: Four times a day (QID) | ORAL | 4 refills | Status: DC | PRN

## 2019-03-07 NOTE — Progress Notes (Signed)
Virtual Visit via Video Note  I connected with Lisa Bruce on 03/07/19 at  4:00 PM EST by a video enabled telemedicine application and verified that I am speaking with the correct person using two identifiers.   I discussed the limitations of evaluation and management by telemedicine and the availability of in person appointments. The patient expressed understanding and agreed to proceed.  History of Present Illness:  We need to follow-up on spasticity, nausea, hypothyroidism f/u C/o more spasticity since Christmas There has been no runny nose, cough, shortness of breath,  diarrhea,  skin rashes.  C/o chronic pain  [name]'s husband is assisting US  Observations/Objective:  The patient appears to be in no acute distress, appears spastic in her UEs w/tremor  Assessment and Plan:  See my Assessment and Plan.  Follow Up Instructions:    I discussed the assessment and treatment plan with the patient. The patient was provided an opportunity to ask questions and all were answered. The patient agreed with the plan and demonstrated an understanding of the instructions.   The patient was advised to call back or seek an in-person evaluation if the symptoms worsen or if the condition fails to improve as anticipated.  I provided face-to-face time during this encounter. We were at different locations.   Walker Kehr, MD

## 2019-03-07 NOTE — Assessment & Plan Note (Addendum)
On MS contin F/u w/Dr Hardin Negus Diazepam, Baclofen  Potential benefits of a long term benzodiazepines  use as well as potential risks  and complications were explained to the patient and were aknowledged.

## 2019-03-07 NOTE — Assessment & Plan Note (Signed)
Cont w/ Levothroid, Cytomel

## 2019-03-07 NOTE — Assessment & Plan Note (Signed)
On B12 

## 2019-03-07 NOTE — Assessment & Plan Note (Signed)
Vit D 

## 2019-07-04 ENCOUNTER — Ambulatory Visit: Payer: 59 | Admitting: Internal Medicine

## 2019-07-04 ENCOUNTER — Other Ambulatory Visit: Payer: Self-pay

## 2019-07-04 ENCOUNTER — Encounter: Payer: Self-pay | Admitting: Internal Medicine

## 2019-07-04 DIAGNOSIS — E538 Deficiency of other specified B group vitamins: Secondary | ICD-10-CM | POA: Diagnosis not present

## 2019-07-04 DIAGNOSIS — E038 Other specified hypothyroidism: Secondary | ICD-10-CM

## 2019-07-04 DIAGNOSIS — R490 Dysphonia: Secondary | ICD-10-CM | POA: Insufficient documentation

## 2019-07-04 DIAGNOSIS — G894 Chronic pain syndrome: Secondary | ICD-10-CM

## 2019-07-04 DIAGNOSIS — K5901 Slow transit constipation: Secondary | ICD-10-CM

## 2019-07-04 DIAGNOSIS — D508 Other iron deficiency anemias: Secondary | ICD-10-CM

## 2019-07-04 DIAGNOSIS — R634 Abnormal weight loss: Secondary | ICD-10-CM | POA: Diagnosis not present

## 2019-07-04 DIAGNOSIS — M2560 Stiffness of unspecified joint, not elsewhere classified: Secondary | ICD-10-CM

## 2019-07-04 DIAGNOSIS — E559 Vitamin D deficiency, unspecified: Secondary | ICD-10-CM

## 2019-07-04 MED ORDER — LEVOTHYROXINE SODIUM 137 MCG PO TABS: 137.0000 ug | ORAL_TABLET | Freq: Every day | ORAL | 3 refills | Status: DC

## 2019-07-04 MED ORDER — LIOTHYRONINE SODIUM 25 MCG PO TABS: 12.5000 ug | ORAL_TABLET | Freq: Every day | ORAL | 3 refills | Status: DC

## 2019-07-04 MED ORDER — BUTALBITAL-APAP-CAFFEINE 50-325-40 MG PO TABS: 1.0000 | ORAL_TABLET | Freq: Four times a day (QID) | ORAL | 4 refills | Status: DC | PRN

## 2019-07-04 MED ORDER — ONDANSETRON HCL 8 MG PO TABS: ORAL_TABLET | ORAL | 3 refills | Status: DC

## 2019-07-04 MED ORDER — DIAZEPAM 10 MG PO TABS: ORAL_TABLET | ORAL | 4 refills | Status: DC

## 2019-07-04 MED ORDER — BACLOFEN 10 MG PO TABS: 10.0000 mg | ORAL_TABLET | Freq: Two times a day (BID) | ORAL | 3 refills | Status: DC

## 2019-07-04 NOTE — Assessment & Plan Note (Signed)
Vit D 

## 2019-07-04 NOTE — Assessment & Plan Note (Addendum)
GI issues - f/u w/Dr [employer]

## 2019-07-04 NOTE — Assessment & Plan Note (Signed)
CBC

## 2019-07-04 NOTE — Assessment & Plan Note (Signed)
F/u w/Dr Phillips  

## 2019-07-04 NOTE — Assessment & Plan Note (Signed)
Linzess [employer]

## 2019-07-04 NOTE — Assessment & Plan Note (Addendum)
?  etiology ENT ref offered. Pt declined

## 2019-07-04 NOTE — Progress Notes (Signed)
Subjective:  Patient ID: Viann Shove, female    DOB: [birth year]  Age: 54 y.o. MRN: [id number]  CC: No chief complaint on file.   HPI HAGEN CANCILLA presents for muscle spasms, pain, weaker voice C/o wt loss  Outpatient Medications Prior to Visit  Medication Sig Dispense Refill  . baclofen (LIORESAL) 10 MG tablet Take 1 tablet (10 mg total) by mouth 2 (two) times daily. 180 tablet 3  . benzoyl peroxide 10 % gel Apply topically at bedtime.    . bisacodyl (DULCOLAX) 5 MG EC tablet Take by mouth.    . butalbital-acetaminophen-caffeine (FIORICET) 50-325-40 MG tablet Take 1 tablet by mouth every 6 (six) hours as needed for headache. 60 tablet 4  . Cholecalciferol (VITAMIN D3) 1.25 MG (50000 UT) CAPS Take 1 capsule by mouth every 14 (fourteen) days. 6 capsule 3  . ciclopirox (PENLAC) 8 % solution Apply topically at bedtime. Apply over nail and surrounding skin. Apply daily over previous coat. After seven (7) days, may remove with alcohol and continue cycle. 6.6 mL 2  . diazepam (VALIUM) 10 MG tablet TAKE 3 TABLETS AT 6AM, 3 TABLETS AT NOON, 3 TABLETS AT 5PM, 4 TABLETS AT BEDTIME 390 tablet 4  . ergocalciferol (VITAMIN D2) 50000 units capsule Take 1 capsule (50,000 Units total) by mouth once a week. 8 capsule 11  . ibuprofen (ADVIL,MOTRIN) 200 MG tablet Take by mouth.    . levothyroxine (SYNTHROID) 137 MCG tablet Take 1 tablet (137 mcg total) by mouth daily before breakfast. 90 tablet 3  . linaclotide (LINZESS) 145 MCG CAPS capsule Take 1 capsule (145 mcg total) by mouth 1 day or 1 dose. 90 capsule 3  . liothyronine (CYTOMEL) 25 MCG tablet Take 0.5 tablets (12.5 mcg total) by mouth daily. 90 tablet 3  . morphine (MS CONTIN) 30 MG 12 hr tablet Take 1 tablet by mouth every 8 (eight) hours as needed.  0  . ondansetron (ZOFRAN) 8 MG tablet TAKE 0.5-1 TABLETS (4-8 MG TOTAL) BY MOUTH EVERY 8 (EIGHT) HOURS AS NEEDED FOR NAUSEA OR VOMITING. 20 tablet 3  . morphine (MSIR) 15 MG tablet TAKE 1 TABLET  BY MOUTH EVERY 6 HOURS AS DIRECTED    . ranitidine (ZANTAC) 150 MG tablet Take by mouth.    [name] tiZANidine (ZANAFLEX) 4 MG capsule Take by mouth.    . torsemide (DEMADEX) 20 MG tablet TAKE 1 TABLET BY MOUTH EVERY DAY AS NEEDED FOR FLUID (Patient not taking: Reported on 07/04/2019) 90 tablet 1   No facility-administered medications prior to visit.    ROS: Review of Systems  Constitutional: Positive for fatigue and unexpected weight change. Negative for activity change, appetite change and chills.  HENT: Negative for congestion, mouth sores and sinus pressure.   Eyes: Negative for visual disturbance.  Respiratory: Negative for cough and chest tightness.   Gastrointestinal: Positive for constipation. Negative for abdominal pain and nausea.  Genitourinary: Negative for difficulty urinating, frequency and vaginal pain.  Musculoskeletal: Positive for arthralgias, gait problem, myalgias and neck stiffness. Negative for back pain.  Skin: Negative for pallor and rash.  Neurological: Positive for weakness and headaches. Negative for dizziness, tremors and numbness.  Psychiatric/Behavioral: Positive for decreased concentration and sleep disturbance. Negative for behavioral problems, confusion, self-injury and suicidal ideas. The patient is nervous/anxious.     Objective:  BP 126/82 (BP Location: Left Arm, Patient Position: Sitting, Cuff Size: Normal)   Pulse 68   Temp 97.9 F (36.6 C) (Oral)   Ht 5'  7" (1.702 m)   Wt 144 lb (65.3 kg)   BMI 22.55 kg/m   BP Readings from Last 3 Encounters:  07/04/19 126/82  11/08/18 110/66  03/29/18 120/72    Wt Readings from Last 3 Encounters:  07/04/19 144 lb (65.3 kg)  11/08/18 141 lb (64 kg)  03/29/18 174 lb (78.9 kg)    Physical Exam Constitutional:      General: She is not in acute distress.    Appearance: She is well-developed.  HENT:     Head: Normocephalic.     Right Ear: External ear normal.     Left Ear: External ear normal.     Nose:  Nose normal.  Eyes:     General:        Right eye: No discharge.        Left eye: No discharge.     Conjunctiva/sclera: Conjunctivae normal.     Pupils: Pupils are equal, round, and reactive to light.  Neck:     Thyroid: No thyromegaly.     Vascular: No JVD.     Trachea: No tracheal deviation.  Cardiovascular:     Rate and Rhythm: Normal rate and regular rhythm.     Heart sounds: Normal heart sounds.  Pulmonary:     Effort: No respiratory distress.     Breath sounds: No stridor. No wheezing.  Abdominal:     General: Bowel sounds are normal. There is no distension.     Palpations: Abdomen is soft. There is no mass.     Tenderness: There is no abdominal tenderness. There is no guarding or rebound.  Musculoskeletal:        General: Tenderness present.     Cervical back: Normal range of motion and neck supple.  Lymphadenopathy:     Cervical: No cervical adenopathy.  Skin:    Findings: No erythema or rash.  Neurological:     Mental Status: She is oriented to person, place, and time.     Cranial Nerves: No cranial nerve deficit.     Motor: Weakness present. No abnormal muscle tone.     Coordination: Coordination abnormal.     Gait: Gait abnormal.     Deep Tendon Reflexes: Reflexes normal.  Psychiatric:        Behavior: Behavior normal.        Thought Content: Thought content normal.        Judgment: Judgment normal.   R arm tremor Spastic Mildly hoarse  Lab Results  Component Value Date   WBC 4.7 11/04/2018   HGB 11.9 (L) 11/04/2018   HCT 34.9 (L) 11/04/2018   PLT 244.0 11/04/2018   GLUCOSE 85 11/04/2018   CHOL 188 11/04/2018   TRIG 110.0 11/04/2018   HDL 58.60 11/04/2018   LDLDIRECT 180.1 05/22/2010   LDLCALC 108 (H) 11/04/2018   ALT 38 (H) 11/04/2018   AST 37 11/04/2018   NA 135 11/04/2018   K 4.0 11/04/2018   CL 103 11/04/2018   CREATININE 0.77 11/04/2018   BUN 9 11/04/2018   CO2 25 11/04/2018   TSH <0.01 (L) 11/04/2018    DG UGI W DOUBLE CM (HD  BA)  Result Date: 09/07/2018 CLINICAL DATA:  Pharyngeal esophageal dysphagia.  Loss of appetite. EXAM: UPPER GI SERIES WITHOUT KUB TECHNIQUE: Routine upper GI series was performed with thin/high density/water soluble barium. FLUOROSCOPY TIME:  Fluoroscopy Time:  5 minutes and 48 seconds. Radiation Exposure Index (if provided by the fluoroscopic device): 273 mGy Number of Acquired Spot  Images: COMPARISON:  Two-view abdomen 09/06/2018 FINDINGS: Frontal and lateral views of the hypopharynx while swallowing are remarkable for laryngeal penetration to the level of the vocal cords on 1 of the initial swallows. No frank aspiration. No episodes of aspiration were observed during the remainder of the exam. Due to limitations related to the patient's clinical status/limited mobility, single contrast study was performed. Despite single-contrast study, upright assessment of the esophagus generates a double-contrast appearance due to spontaneous swallowing of air. There is no evidence for esophageal diverticulum, esophageal mass lesion, or gross mucosal ulceration. Mild narrowing of the distal esophagus evident. A 13 mm barium tablet lodges at this area of smooth mild narrowing in the distal esophagus. Patient was given 5 alternating swallows of thin barium and water and the tablet did ultimately pass into the stomach on the fifth attempt while drinking water. Assessment of esophageal motility shows preservation of the primary peristaltic stripping wave on 4/5 swallows with some minimal proximal escape. Mild tertiary contractions noted in the distal third of the esophagus. No evidence for presbyesophagus. No evidence for hiatal hernia. Single contrast imaging of the stomach with spot compression shows no gastric mass lesion or gross mucosal ulceration. Gastric emptying is prompt. Pylorus and duodenal bulb are normal. By 15 minutes after administration of the first swallow of barium, no migration of barium beyond the ligament  of Treitz is observed. IMPRESSION: 1. Gradual, smooth, mild narrowing of the distal esophagus just proximal to the esophagogastric junction. A 13 mm barium tablet becomes lodged at this location through multiple repeat swallows of thin barium and water before passing into the stomach. 2. Good preservation of primary peristaltic stripping wave with some tertiary contractions noted in the distal third of the esophagus. 3. Despite prompt gastric emptying, sluggish peristalsis noted in the duodenum. By 15 minutes after starting the procedure, no barium had migrated into the jejunum. 4. Stomach is normally positioned without evidence of hiatal hernia. No gross ulceration or mass lesion on single contrast imaging. 5. Laryngeal penetration noted on initial swallows of thick barium with penetration to the level of the vocal cords. No frank aspiration below the level of the cords. No subsequent episodes of aspiration were observed. Electronically Signed   By: Misty Stanley M.D.   On: 09/07/2018 09:33    Assessment & Plan:     Walker Kehr, MD

## 2019-07-04 NOTE — Assessment & Plan Note (Signed)
On b12 

## 2019-07-04 NOTE — Assessment & Plan Note (Signed)
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 Pt was seeing Dr Tyron Russell seeing Dr Audry Riles (Neurology) She had a muscle bx at [facility] positive for granulomatous inflamation / sarcoidosis Positive ANA Meds renewed

## 2019-07-04 NOTE — Assessment & Plan Note (Signed)
Labs Wt loss 

## 2019-07-05 LAB — CBC WITH DIFFERENTIAL/PLATELET
Basophils Absolute: 0 10*3/uL (ref 0.0–0.1)
Basophils Relative: 0.9 % (ref 0.0–3.0)
Eosinophils Absolute: 0.1 10*3/uL (ref 0.0–0.7)
Eosinophils Relative: 1.2 % (ref 0.0–5.0)
HCT: 37.6 % (ref 36.0–46.0)
Hemoglobin: 13.1 g/dL (ref 12.0–15.0)
Lymphocytes Relative: 37.8 % (ref 12.0–46.0)
Lymphs Abs: 1.8 10*3/uL (ref 0.7–4.0)
MCHC: 34.9 g/dL (ref 30.0–36.0)
MCV: 90 fl (ref 78.0–100.0)
Monocytes Absolute: 0.3 10*3/uL (ref 0.1–1.0)
Monocytes Relative: 6.8 % (ref 3.0–12.0)
Neutro Abs: 2.5 10*3/uL (ref 1.4–7.7)
Neutrophils Relative %: 53.3 % (ref 43.0–77.0)
Platelets: 264 10*3/uL (ref 150.0–400.0)
RBC: 4.18 Mil/uL (ref 3.87–5.11)
RDW: 14.4 % (ref 11.5–15.5)
WBC: 4.7 10*3/uL (ref 4.0–10.5)

## 2019-07-05 LAB — HEPATIC FUNCTION PANEL
ALT: 23 U/L (ref 0–35)
AST: 25 U/L (ref 0–37)
Albumin: 4.3 g/dL (ref 3.5–5.2)
Alkaline Phosphatase: 93 U/L (ref 39–117)
Bilirubin, Direct: 0.1 mg/dL (ref 0.0–0.3)
Total Bilirubin: 0.3 mg/dL (ref 0.2–1.2)
Total Protein: 8 g/dL (ref 6.0–8.3)

## 2019-07-05 LAB — TSH: TSH: 0.01 u[IU]/mL — ABNORMAL LOW (ref 0.35–4.50)

## 2019-07-05 LAB — IRON,TIBC AND FERRITIN PANEL
%SAT: 53 % (calc) — ABNORMAL HIGH (ref 16–45)
Ferritin: 14 ng/mL — ABNORMAL LOW (ref 16–232)
Iron: 155 ug/dL (ref 45–160)
TIBC: 290 mcg/dL (calc) (ref 250–450)

## 2019-07-05 LAB — VITAMIN B12: Vitamin B-12: 561 pg/mL (ref 211–911)

## 2019-07-05 LAB — T4, FREE: Free T4: 0.94 ng/dL (ref 0.60–1.60)

## 2019-07-05 LAB — VITAMIN D 25 HYDROXY (VIT D DEFICIENCY, FRACTURES): VITD: 64.84 ng/mL (ref 30.00–100.00)

## 2019-07-24 ENCOUNTER — Other Ambulatory Visit: Payer: Self-pay | Admitting: Gastroenterology

## 2019-07-24 DIAGNOSIS — R634 Abnormal weight loss: Secondary | ICD-10-CM

## 2019-07-25 ENCOUNTER — Other Ambulatory Visit (INDEPENDENT_AMBULATORY_CARE_PROVIDER_SITE_OTHER): Payer: 59

## 2019-07-25 DIAGNOSIS — Z0000 Encounter for general adult medical examination without abnormal findings: Secondary | ICD-10-CM

## 2019-07-26 LAB — URINALYSIS, ROUTINE W REFLEX MICROSCOPIC
Bilirubin Urine: NEGATIVE
Ketones, ur: NEGATIVE
Nitrite: NEGATIVE
Specific Gravity, Urine: 1.015 (ref 1.000–1.030)
Total Protein, Urine: NEGATIVE
Urine Glucose: NEGATIVE
Urobilinogen, UA: 0.2 (ref 0.0–1.0)
pH: 6 (ref 5.0–8.0)

## 2019-08-08 ENCOUNTER — Other Ambulatory Visit: Payer: 59

## 2019-11-02 ENCOUNTER — Ambulatory Visit: Payer: 59 | Admitting: Internal Medicine

## 2019-11-02 ENCOUNTER — Encounter: Payer: Self-pay | Admitting: Internal Medicine

## 2019-11-02 ENCOUNTER — Other Ambulatory Visit: Payer: Self-pay

## 2019-11-02 DIAGNOSIS — G2582 Stiff-man syndrome: Secondary | ICD-10-CM | POA: Diagnosis not present

## 2019-11-02 DIAGNOSIS — R634 Abnormal weight loss: Secondary | ICD-10-CM

## 2019-11-02 DIAGNOSIS — E538 Deficiency of other specified B group vitamins: Secondary | ICD-10-CM | POA: Diagnosis not present

## 2019-11-02 DIAGNOSIS — E038 Other specified hypothyroidism: Secondary | ICD-10-CM | POA: Diagnosis not present

## 2019-11-02 DIAGNOSIS — K5901 Slow transit constipation: Secondary | ICD-10-CM

## 2019-11-02 MED ORDER — ONDANSETRON HCL 8 MG PO TABS: ORAL_TABLET | ORAL | 3 refills | Status: DC

## 2019-11-02 MED ORDER — BUTALBITAL-APAP-CAFFEINE 50-325-40 MG PO TABS: 1.0000 | ORAL_TABLET | Freq: Four times a day (QID) | ORAL | 4 refills | Status: DC | PRN

## 2019-11-02 MED ORDER — DIAZEPAM 10 MG PO TABS: ORAL_TABLET | ORAL | 4 refills | Status: DC

## 2019-11-02 NOTE — Assessment & Plan Note (Signed)
F/u w/Dr Buccini. He was planning to get a CT of abd/pelvis

## 2019-11-02 NOTE — Assessment & Plan Note (Signed)
F/u w/Dr Hardin Negus

## 2019-11-02 NOTE — Assessment & Plan Note (Signed)
On Levothroid, Cytomel

## 2019-11-02 NOTE — Patient Instructions (Signed)
[place] started vaccine booster sign up. Please call  Vaccine Line at [phone]. You can also call the venue where you had your initial COVID 19 vaccination.   

## 2019-11-02 NOTE — Assessment & Plan Note (Signed)
On B12 

## 2019-11-02 NOTE — Progress Notes (Signed)
Subjective:  Patient ID: Lisa Bruce, female    DOB: 20-Mar-1965  Age: 54 y.o. MRN: [id number]  CC: No chief complaint on file.   HPI Lisa Bruce presents for spasticity, HAs, constipation f/u  Outpatient Medications Prior to Visit  Medication Sig Dispense Refill  . Bacillus Coagulans-Inulin (ALIGN PREBIOTIC-PROBIOTIC PO) Take 1 capsule by mouth.    . baclofen (LIORESAL) 10 MG tablet Take 1 tablet (10 mg total) by mouth 2 (two) times daily. 180 tablet 3  . benzoyl peroxide 10 % gel Apply topically at bedtime.    . bisacodyl (DULCOLAX) 5 MG EC tablet Take by mouth.    . butalbital-acetaminophen-caffeine (FIORICET) 50-325-40 MG tablet Take 1 tablet by mouth every 6 (six) hours as needed for headache. 60 tablet 4  . Cholecalciferol (VITAMIN D3) 125 MCG (5000 UT) TABS Take 1 tablet by mouth.    . ciclopirox (PENLAC) 8 % solution Apply topically at bedtime. Apply over nail and surrounding skin. Apply daily over previous coat. After seven (7) days, may remove with alcohol and continue cycle. 6.6 mL 2  . diazepam (VALIUM) 10 MG tablet TAKE 3 TABLETS AT 6AM, 3 TABLETS AT NOON, 3 TABLETS AT 5PM, 4 TABLETS AT BEDTIME 390 tablet 4  . ibuprofen (ADVIL,MOTRIN) 200 MG tablet Take by mouth.    . levothyroxine (SYNTHROID) 137 MCG tablet Take 1 tablet (137 mcg total) by mouth daily before breakfast. 90 tablet 3  . linaclotide (LINZESS) 145 MCG CAPS capsule Take 1 capsule (145 mcg total) by mouth 1 day or 1 dose. 90 capsule 3  . liothyronine (CYTOMEL) 25 MCG tablet Take 0.5 tablets (12.5 mcg total) by mouth daily. 90 tablet 3  . morphine (MS CONTIN) 30 MG 12 hr tablet Take 1 tablet by mouth every 8 (eight) hours as needed.  0  . ondansetron (ZOFRAN) 8 MG tablet TAKE 0.5-1 TABLETS (4-8 MG TOTAL) BY MOUTH EVERY 8 (EIGHT) HOURS AS NEEDED FOR NAUSEA OR VOMITING. 20 tablet 3  . Cholecalciferol (VITAMIN D3) 1.25 MG (50000 UT) CAPS Take 1 capsule by mouth every 14 (fourteen) days. 6 capsule 3  .  ergocalciferol (VITAMIN D2) 50000 units capsule Take 1 capsule (50,000 Units total) by mouth once a week. (Patient not taking: Reported on 11/02/2019) 8 capsule 11   No facility-administered medications prior to visit.    ROS: Review of Systems  Constitutional: Positive for fatigue. Negative for activity change, appetite change, chills and unexpected weight change.  HENT: Negative for congestion, mouth sores and sinus pressure.   Eyes: Negative for visual disturbance.  Respiratory: Negative for cough and chest tightness.   Gastrointestinal: Positive for constipation. Negative for abdominal pain and nausea.  Genitourinary: Negative for difficulty urinating, frequency and vaginal pain.  Musculoskeletal: Positive for gait problem, myalgias and neck stiffness. Negative for arthralgias, back pain and joint swelling.  Skin: Negative for pallor and rash.  Neurological: Positive for weakness and headaches. Negative for dizziness, tremors and numbness.  Psychiatric/Behavioral: Negative for confusion, decreased concentration, sleep disturbance and suicidal ideas. The patient is nervous/anxious.     Objective:  BP 100/72 (BP Location: Left Arm, Patient Position: Sitting, Cuff Size: Normal)   Temp 98.1 F (36.7 C) (Oral)   Ht 5\' 7"  (1.702 m)   Wt 151 lb (68.5 kg)   BMI 23.65 kg/m   BP Readings from Last 3 Encounters:  11/02/19 100/72  07/04/19 126/82  11/08/18 110/66    Wt Readings from Last 3 Encounters:  11/02/19 151 lb (  68.5 kg)  07/04/19 144 lb (65.3 kg)  11/08/18 141 lb (64 kg)    Physical Exam Constitutional:      General: She is not in acute distress.    Appearance: She is well-developed. She is not toxic-appearing.  HENT:     Head: Normocephalic.     Right Ear: External ear normal.     Left Ear: External ear normal.     Nose: Nose normal.  Eyes:     General:        Right eye: No discharge.        Left eye: No discharge.     Conjunctiva/sclera: Conjunctivae normal.      Pupils: Pupils are equal, round, and reactive to light.  Neck:     Thyroid: No thyromegaly.     Vascular: No JVD.     Trachea: No tracheal deviation.  Cardiovascular:     Rate and Rhythm: Normal rate and regular rhythm.     Heart sounds: Normal heart sounds.  Pulmonary:     Effort: No respiratory distress.     Breath sounds: No stridor. No wheezing.  Abdominal:     General: Bowel sounds are normal. There is no distension.     Palpations: Abdomen is soft. There is no mass.     Tenderness: There is no abdominal tenderness. There is no guarding or rebound.  Musculoskeletal:        General: No tenderness.     Cervical back: Normal range of motion and neck supple.  Lymphadenopathy:     Cervical: No cervical adenopathy.  Skin:    Findings: No erythema or rash.  Neurological:     Mental Status: She is oriented to person, place, and time.     Cranial Nerves: No cranial nerve deficit.     Motor: Weakness present. No abnormal muscle tone.     Coordination: Coordination abnormal.     Gait: Gait abnormal.     Deep Tendon Reflexes: Reflexes normal.  Psychiatric:        Behavior: Behavior normal.        Thought Content: Thought content normal.        Judgment: Judgment normal.    Spastic R hand tremor  Lab Results  Component Value Date   WBC 4.7 07/04/2019   HGB 13.1 07/04/2019   HCT 37.6 07/04/2019   PLT 264.0 07/04/2019   GLUCOSE 85 11/04/2018   CHOL 188 11/04/2018   TRIG 110.0 11/04/2018   HDL 58.60 11/04/2018   LDLDIRECT 180.1 05/22/2010   LDLCALC 108 (H) 11/04/2018   ALT 23 07/04/2019   AST 25 07/04/2019   NA 135 11/04/2018   K 4.0 11/04/2018   CL 103 11/04/2018   CREATININE 0.77 11/04/2018   BUN 9 11/04/2018   CO2 25 11/04/2018   TSH 0.01 (L) 07/04/2019    DG UGI W DOUBLE CM (HD BA)  Result Date: 09/07/2018 CLINICAL DATA:  Pharyngeal esophageal dysphagia.  Loss of appetite. EXAM: UPPER GI SERIES WITHOUT KUB TECHNIQUE: Routine upper GI series was performed with  thin/high density/water soluble barium. FLUOROSCOPY TIME:  Fluoroscopy Time:  5 minutes and 48 seconds. Radiation Exposure Index (if provided by the fluoroscopic device): 273 mGy Number of Acquired Spot Images: COMPARISON:  Two-view abdomen 09/06/2018 FINDINGS: Frontal and lateral views of the hypopharynx while swallowing are remarkable for laryngeal penetration to the level of the vocal cords on 1 of the initial swallows. No frank aspiration. No episodes of aspiration were observed during the  remainder of the exam. Due to limitations related to the patient's clinical status/limited mobility, single contrast study was performed. Despite single-contrast study, upright assessment of the esophagus generates a double-contrast appearance due to spontaneous swallowing of air. There is no evidence for esophageal diverticulum, esophageal mass lesion, or gross mucosal ulceration. Mild narrowing of the distal esophagus evident. A 13 mm barium tablet lodges at this area of smooth mild narrowing in the distal esophagus. Patient was given 5 alternating swallows of thin barium and water and the tablet did ultimately pass into the stomach on the fifth attempt while drinking water. Assessment of esophageal motility shows preservation of the primary peristaltic stripping wave on 4/5 swallows with some minimal proximal escape. Mild tertiary contractions noted in the distal third of the esophagus. No evidence for presbyesophagus. No evidence for hiatal hernia. Single contrast imaging of the stomach with spot compression shows no gastric mass lesion or gross mucosal ulceration. Gastric emptying is prompt. Pylorus and duodenal bulb are normal. By 15 minutes after administration of the first swallow of barium, no migration of barium beyond the ligament of Treitz is observed. IMPRESSION: 1. Gradual, smooth, mild narrowing of the distal esophagus just proximal to the esophagogastric junction. A 13 mm barium tablet becomes lodged at this  location through multiple repeat swallows of thin barium and water before passing into the stomach. 2. Good preservation of primary peristaltic stripping wave with some tertiary contractions noted in the distal third of the esophagus. 3. Despite prompt gastric emptying, sluggish peristalsis noted in the duodenum. By 15 minutes after starting the procedure, no barium had migrated into the jejunum. 4. Stomach is normally positioned without evidence of hiatal hernia. No gross ulceration or mass lesion on single contrast imaging. 5. Laryngeal penetration noted on initial swallows of thick barium with penetration to the level of the vocal cords. No frank aspiration below the level of the cords. No subsequent episodes of aspiration were observed. Electronically Signed   By: Misty Stanley M.D.   On: 09/07/2018 09:33    Assessment & Plan:    Follow-up: No follow-ups on file.  Walker Kehr, MD

## 2019-11-02 NOTE — Assessment & Plan Note (Signed)
Resolved

## 2019-11-22 DIAGNOSIS — M791 Myalgia, unspecified site: Secondary | ICD-10-CM | POA: Diagnosis not present

## 2019-11-22 DIAGNOSIS — G259 Extrapyramidal and movement disorder, unspecified: Secondary | ICD-10-CM | POA: Diagnosis not present

## 2019-11-22 DIAGNOSIS — Z79891 Long term (current) use of opiate analgesic: Secondary | ICD-10-CM | POA: Diagnosis not present

## 2019-11-22 DIAGNOSIS — G894 Chronic pain syndrome: Secondary | ICD-10-CM | POA: Diagnosis not present

## 2020-01-23 DIAGNOSIS — G259 Extrapyramidal and movement disorder, unspecified: Secondary | ICD-10-CM | POA: Diagnosis not present

## 2020-01-23 DIAGNOSIS — Z79891 Long term (current) use of opiate analgesic: Secondary | ICD-10-CM | POA: Diagnosis not present

## 2020-01-23 DIAGNOSIS — G894 Chronic pain syndrome: Secondary | ICD-10-CM | POA: Diagnosis not present

## 2020-01-23 DIAGNOSIS — M791 Myalgia, unspecified site: Secondary | ICD-10-CM | POA: Diagnosis not present

## 2020-03-05 ENCOUNTER — Telehealth (INDEPENDENT_AMBULATORY_CARE_PROVIDER_SITE_OTHER): Payer: BC Managed Care – PPO | Admitting: Internal Medicine

## 2020-03-05 ENCOUNTER — Other Ambulatory Visit: Payer: Self-pay

## 2020-03-05 ENCOUNTER — Encounter: Payer: Self-pay | Admitting: Internal Medicine

## 2020-03-05 DIAGNOSIS — M2560 Stiffness of unspecified joint, not elsewhere classified: Secondary | ICD-10-CM | POA: Diagnosis not present

## 2020-03-05 DIAGNOSIS — E038 Other specified hypothyroidism: Secondary | ICD-10-CM

## 2020-03-05 DIAGNOSIS — R110 Nausea: Secondary | ICD-10-CM

## 2020-03-05 DIAGNOSIS — G2582 Stiff-man syndrome: Secondary | ICD-10-CM | POA: Diagnosis not present

## 2020-03-05 MED ORDER — BACLOFEN 10 MG PO TABS: 10.0000 mg | ORAL_TABLET | Freq: Two times a day (BID) | ORAL | 3 refills | Status: DC

## 2020-03-05 MED ORDER — LEVOTHYROXINE SODIUM 137 MCG PO TABS: 137.0000 ug | ORAL_TABLET | Freq: Every day | ORAL | 3 refills | Status: DC

## 2020-03-05 MED ORDER — BUTALBITAL-APAP-CAFFEINE 50-325-40 MG PO TABS: 1.0000 | ORAL_TABLET | Freq: Four times a day (QID) | ORAL | 4 refills | Status: DC | PRN

## 2020-03-05 MED ORDER — DIAZEPAM 10 MG PO TABS: ORAL_TABLET | ORAL | 4 refills | Status: DC

## 2020-03-05 MED ORDER — ONDANSETRON HCL 8 MG PO TABS: ORAL_TABLET | ORAL | 3 refills | Status: DC

## 2020-03-05 MED ORDER — LIOTHYRONINE SODIUM 25 MCG PO TABS: 12.5000 ug | ORAL_TABLET | Freq: Every day | ORAL | 3 refills | Status: DC

## 2020-03-05 NOTE — Assessment & Plan Note (Signed)
MS Contin prescribed by Dr. Hardin Negus Baclofen for spasms.  Diazepam for spasms and shaking.  Potential benefits of a long term opioids, benzodiazepines use as well as potential risks (i.e. addiction risk, apnea etc) and complications (i.e. Somnolence, constipation and others) were explained to the patient and were aknowledged.

## 2020-03-05 NOTE — Progress Notes (Signed)
Virtual Visit via Video Note  I connected with Lisa Bruce on 03/05/20 at  4:00 PM EST by a video enabled telemedicine application and verified that I am speaking with the correct person using two identifiers.   I discussed the limitations of evaluation and management by telemedicine and the availability of in person appointments. The patient expressed understanding and agreed to proceed.  I was located at our [facility] office. The patient was at home. There was Darren present in the visit.   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  Follow-up appointment headaches, chronic nausea, hypothyroidism.  There has been no  cough, chest pain, shortness of breath, abdominal pain, diarrhea,  skin rashes.   Observations/Objective: The patient appears to be in no acute distress, looks ok.  Assessment and Plan:  See my Assessment and Plan. Follow Up Instructions:    I discussed the assessment and treatment plan with the patient. The patient was provided an opportunity to ask questions and all were answered. The patient agreed with the plan and demonstrated an understanding of the instructions.   The patient was advised to call back or seek an in-person evaluation if the symptoms worsen or if the condition fails to improve as anticipated.  I provided face-to-face time during this encounter. We were at different locations.   Walker Kehr, MD

## 2020-03-05 NOTE — Assessment & Plan Note (Signed)
Baclofen, diazepam as needed  Potential benefits of a long term benzodiazepines  use as well as potential risks  and complications were explained to the patient and were aknowledged.

## 2020-03-05 NOTE — Assessment & Plan Note (Signed)
Continue with Levothroid and Cytomel combination

## 2020-03-05 NOTE — Assessment & Plan Note (Signed)
Ondansetron as needed

## 2020-03-08 ENCOUNTER — Telehealth: Payer: Self-pay | Admitting: Internal Medicine

## 2020-03-08 MED ORDER — LEVOTHYROXINE SODIUM 137 MCG PO TABS: 137.0000 ug | ORAL_TABLET | Freq: Every day | ORAL | 3 refills | Status: DC

## 2020-03-08 MED ORDER — BACLOFEN 10 MG PO TABS: 10.0000 mg | ORAL_TABLET | Freq: Two times a day (BID) | ORAL | 3 refills | Status: DC

## 2020-03-08 MED ORDER — LIOTHYRONINE SODIUM 25 MCG PO TABS: 12.5000 ug | ORAL_TABLET | Freq: Every day | ORAL | 3 refills | Status: DC

## 2020-03-08 NOTE — Telephone Encounter (Signed)
Per chart MD sent refill on 03/05/20, and received confirmation it was receive. [facility]

## 2020-03-08 NOTE — Telephone Encounter (Signed)
1.Medication Requested: levothyroxine (SYNTHROID) 137 MCG tablet  liothyronine (CYTOMEL) 25 MCG tablet  baclofen (LIORESAL) 10 MG tablet    2. Pharmacy (Name, Street, [place]): [id number] [place], [place] - [address]  3. On Med List: yes   4. Last Visit with PCP: 03/05/20  5. Next visit date with PCP: 07/02/20  Patient said that the pharmacy did not receive these medications from the initial refill. She was also wondering if she could get refills until her next appointment in 06/2020.    Agent: Please be advised that RX refills may take up to 3 business days. We ask that you follow-up with your pharmacy.

## 2020-03-12 ENCOUNTER — Ambulatory Visit: Payer: BC Managed Care – PPO | Admitting: Internal Medicine

## 2020-03-12 ENCOUNTER — Encounter: Payer: Self-pay | Admitting: Internal Medicine

## 2020-03-12 ENCOUNTER — Other Ambulatory Visit: Payer: Self-pay

## 2020-03-12 VITALS — BP 110/82 | HR 70 | Temp 99.1°F | Ht 67.0 in

## 2020-03-12 DIAGNOSIS — G2582 Stiff-man syndrome: Secondary | ICD-10-CM

## 2020-03-12 DIAGNOSIS — R5382 Chronic fatigue, unspecified: Secondary | ICD-10-CM | POA: Diagnosis not present

## 2020-03-12 DIAGNOSIS — E559 Vitamin D deficiency, unspecified: Secondary | ICD-10-CM

## 2020-03-12 DIAGNOSIS — D508 Other iron deficiency anemias: Secondary | ICD-10-CM

## 2020-03-12 DIAGNOSIS — E538 Deficiency of other specified B group vitamins: Secondary | ICD-10-CM

## 2020-03-12 DIAGNOSIS — R21 Rash and other nonspecific skin eruption: Secondary | ICD-10-CM | POA: Insufficient documentation

## 2020-03-12 DIAGNOSIS — E038 Other specified hypothyroidism: Secondary | ICD-10-CM

## 2020-03-12 NOTE — Assessment & Plan Note (Addendum)
Acute Associated with decreased appetite, chills, sweats, fatigue, left-sided sinus pressure, nausea, myalgias, dizziness and headaches.  Some of the symptoms may not be acute Given her other symptoms the rash is likely viral in nature, less likely autoimmune or cancer She denies any sick contacts or known exposures Discussed the importance of symptomatic treatment She is supposed to be getting routine blood work, which is very comprehensive so she will go ahead and get that today which will hopefully help determine the cause Consider getting a Covid test, but she does seem low risk Continue Motrin and symptomatic treatment Depending on blood work and symptoms may need further evaluation

## 2020-03-12 NOTE — Progress Notes (Signed)
Subjective:    Patient ID: Lisa Bruce, female    DOB: 1965-09-10, 55 y.o.   MRN: [id number]  HPI The patient is here for an acute visit.   She has not felt well for the past week.  She has been having chills.  At night she has had sweats.  She has not had a fever.  A few days ago her back felt like pins and needles.  There is no rash at that time, but yesterday she noticed a rash on her back.  It is now on her abdomen.  She denies any rash on her extremities.  The rash is prickly.  She states feeling tired, the left side of her sinus is clogged she has had some headaches, dizziness, some body aches and nausea.  She did a home test for Covid a few weeks ago and it was negative.  Those symptoms were little different.  She is not sure if she did the test right.  She states she stays at home and has not had contact with anyone except for her son who is in college.  He has not had any symptoms or been sick.  She was concerned she may have shingles      Medications and allergies reviewed with patient and updated if appropriate.  Patient Active Problem List   Diagnosis Date Noted  . Chronic nausea 03/05/2020  . Weight loss 07/04/2019  . Hoarseness 07/04/2019  . Chronic pain syndrome 11/27/2015  . Onychomycosis 07/23/2015  . Lipoma of abdominal wall 01/16/2015  . Anemia, iron deficiency 09/02/2014  . RUQ abdominal pain 09/02/2014  . Muscle stiffness 05/28/2014  . Headache 04/04/2014  . Constipation by delayed colonic transit 05/10/2013  . Seborrheic keratosis, inflamed 08/14/2012  . Vertigo 05/11/2011  . Hypothyroidism 08/08/2010  . Vitamin D deficiency 06/04/2010  . Vitamin B12 deficiency 06/04/2010  . Basal cell carcinoma of skin 05/27/2010  . Stiffness in joint 05/21/2010  . Tremor of right hand 05/21/2010  . Hashimoto's thyroiditis 05/21/2010  . Fatigue 05/21/2010  . ANA positive 05/21/2010  . Edema 05/21/2010  . 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 Bacillus Coagulans-Inulin (ALIGN PREBIOTIC-PROBIOTIC PO) Take 1 capsule by mouth.    . baclofen (LIORESAL) 10 MG tablet Take 1 tablet (10 mg total) by mouth 2 (two) times daily. 180 tablet 3  . benzoyl peroxide 10 % gel Apply topically at bedtime.    . bisacodyl (DULCOLAX) 5 MG EC tablet Take by mouth.    . butalbital-acetaminophen-caffeine (FIORICET) 50-325-40 MG tablet Take 1 tablet by mouth every 6 (six) hours as needed for headache. 60 tablet 4  . Cholecalciferol (VITAMIN D3) 125 MCG (5000 UT) TABS Take 1 tablet by mouth.    . ciclopirox (PENLAC) 8 % solution Apply topically at bedtime. Apply over nail and surrounding skin. Apply daily over previous coat. After seven (7) days, may remove with alcohol and continue cycle. 6.6 mL 2  . diazepam (VALIUM) 10 MG tablet TAKE 3 TABLETS AT 6AM, 3 TABLETS AT NOON, 3 TABLETS AT 5PM, 4 TABLETS AT BEDTIME 390 tablet 4  . ibuprofen (ADVIL,MOTRIN) 200 MG tablet Take by mouth.    . levothyroxine (SYNTHROID) 137 MCG tablet Take 1 tablet (137 mcg total) by mouth daily before breakfast. 90 tablet 3  . linaclotide (LINZESS) 145 MCG CAPS capsule Take 1 capsule (145 mcg total) by mouth 1 day or 1  dose. 90 capsule 3  . liothyronine (CYTOMEL) 25 MCG tablet Take 0.5 tablets (12.5 mcg total) by mouth daily. 90 tablet 3  . morphine (MS CONTIN) 30 MG 12 hr tablet Take 1 tablet by mouth every 8 (eight) hours as needed.  0  . ondansetron (ZOFRAN) 8 MG tablet TAKE 0.5-1 TABLETS (4-8 MG TOTAL) BY MOUTH EVERY 8 (EIGHT) HOURS AS NEEDED FOR NAUSEA OR VOMITING. 20 tablet 3  . [DISCONTINUED] methylphenidate (RITALIN) 5 MG tablet Take 1 tablet (5 mg total) by mouth 2 (two) times daily. 1 in am and 1 at lunch 60 tablet 0   No current facility-administered medications on file prior to visit.    Past Medical History:  Diagnosis Date  . Anemia 09/02/2014  . Anxiety   . Basal cell carcinoma    Multiple  .  Neuromuscular disorder (HCC)   . Thyroid disease     Past Surgical History:  Procedure Laterality Date  . CHOLECYSTECTOMY    . TONSILLECTOMY AND ADENOIDECTOMY      Social History   Socioeconomic History  . Marital status: Married    Spouse name: Darren  . Number of children: 2  . Years of education: 46  . Highest education level: Not on file  Occupational History  . 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 Smoking status: Never Smoker  . Smokeless tobacco: Never Used  Substance and Sexual Activity  . Alcohol use: No  . Drug use: No  . Sexual activity: Yes  Other Topics Concern  . Not on file  Social History Narrative   Patient lives at home with family.   Caffeine Use: occasionally   Social Determinants of Health   Financial Resource Strain: Not on file  Food Insecurity: Not on file  Transportation Needs: Not on file  Physical Activity: Not on file  Stress: Not on file  Social Connections: Not on file    Family History  Problem Relation Age of Onset  . Cancer Father 80       melanoma  . Scleroderma Brother     Review of Systems  Constitutional: Positive for appetite change (dec), chills, diaphoresis and fatigue (more than normal). Negative for fever.  HENT: Positive for sinus pressure (left side). Negative for congestion and sinus pain.   Respiratory: Negative for cough, shortness of breath and wheezing.   Gastrointestinal: Positive for constipation and nausea.  Genitourinary: Negative for dysuria.  Musculoskeletal: Positive for myalgias.  Skin: Positive for rash (back - noticed it 1/24).  Neurological: Positive for dizziness (last week) and headaches.       Prickly feeling in back started a few days ago       Objective:   Vitals:   03/12/20 1548  BP: 110/82  Pulse: 70  Temp: 99.1 F (37.3 C)   BP Readings from Last 3 Encounters:  03/12/20 110/82  11/02/19 100/72  07/04/19 126/82   Wt Readings from Last 3 Encounters:   11/02/19 151 lb (68.5 kg)  07/04/19 144 lb (65.3 kg)  11/08/18 141 lb (64 kg)   Body mass index is 23.65 kg/m.   Physical Exam Constitutional:      General: She is not in acute distress.    Appearance: Normal appearance. She is not ill-appearing.  HENT:     Head: Normocephalic and atraumatic.  Cardiovascular:     Rate and Rhythm: Normal rate and regular rhythm.  Pulmonary:     Effort: Pulmonary effort  is normal. No respiratory distress.     Breath sounds: Normal breath sounds. No wheezing or rales.  Musculoskeletal:     Cervical back: Neck supple. No tenderness.     Right lower leg: No edema.     Left lower leg: No edema.  Lymphadenopathy:     Cervical: No cervical adenopathy.  Skin:    General: Skin is warm and dry.     Findings: Rash (Maculopapular rash diffusely on back and abdomen) present.  Neurological:     Mental Status: She is alert.     Comments: Dystonia right arm            Assessment & Plan:    She was supposed to have blood work done for PCP and that was ordered for our [place] lab.  I will reorder that for our lab here so that she does not need to go over there.    See Problem List for Assessment and Plan of chronic medical problems.    This visit occurred during the SARS-CoV-2 public health emergency.  Safety protocols were in place, including screening questions prior to the visit, additional usage of staff PPE, and extensive cleaning of exam room while observing appropriate contact time as indicated for disinfecting solutions.

## 2020-03-12 NOTE — Patient Instructions (Addendum)
Have blood work done downstairs.     Treat your symptoms as needed for now.       Call if your symptoms worsen, change or do not improve

## 2020-03-13 LAB — CBC WITH DIFFERENTIAL/PLATELET
Basophils Absolute: 0 10*3/uL (ref 0.0–0.1)
Basophils Relative: 0.9 % (ref 0.0–3.0)
Eosinophils Absolute: 0 10*3/uL (ref 0.0–0.7)
Eosinophils Relative: 1 % (ref 0.0–5.0)
HCT: 39 % (ref 36.0–46.0)
Hemoglobin: 13.2 g/dL (ref 12.0–15.0)
Lymphocytes Relative: 46.9 % — ABNORMAL HIGH (ref 12.0–46.0)
Lymphs Abs: 2 10*3/uL (ref 0.7–4.0)
MCHC: 33.8 g/dL (ref 30.0–36.0)
MCV: 91.7 fl (ref 78.0–100.0)
Monocytes Absolute: 0.3 10*3/uL (ref 0.1–1.0)
Monocytes Relative: 7.2 % (ref 3.0–12.0)
Neutro Abs: 1.9 10*3/uL (ref 1.4–7.7)
Neutrophils Relative %: 44 % (ref 43.0–77.0)
Platelets: 267 10*3/uL (ref 150.0–400.0)
RBC: 4.26 Mil/uL (ref 3.87–5.11)
RDW: 13.2 % (ref 11.5–15.5)
WBC: 4.3 10*3/uL (ref 4.0–10.5)

## 2020-03-13 LAB — COMPREHENSIVE METABOLIC PANEL
ALT: 28 U/L (ref 0–35)
AST: 24 U/L (ref 0–37)
Albumin: 4.2 g/dL (ref 3.5–5.2)
Alkaline Phosphatase: 106 U/L (ref 39–117)
BUN: 13 mg/dL (ref 6–23)
CO2: 25 mEq/L (ref 19–32)
Calcium: 9.4 mg/dL (ref 8.4–10.5)
Chloride: 103 mEq/L (ref 96–112)
Creatinine, Ser: 0.88 mg/dL (ref 0.40–1.20)
GFR: 74.74 mL/min (ref >60.00)
Glucose, Bld: 87 mg/dL (ref 70–99)
Potassium: 3.7 mEq/L (ref 3.5–5.1)
Sodium: 135 mEq/L (ref 135–145)
Total Bilirubin: 0.3 mg/dL (ref 0.2–1.2)
Total Protein: 8 g/dL (ref 6.0–8.3)

## 2020-03-13 LAB — T3, FREE: T3, Free: 4 pg/mL (ref 2.3–4.2)

## 2020-03-13 LAB — LIPID PANEL
Cholesterol: 228 mg/dL — ABNORMAL HIGH (ref 0–200)
HDL: 69.2 mg/dL (ref >39.00)
LDL Cholesterol: 131 mg/dL — ABNORMAL HIGH (ref 0–99)
NonHDL: 158.93
Total CHOL/HDL Ratio: 3
Triglycerides: 140 mg/dL (ref 0.0–149.0)
VLDL: 28 mg/dL (ref 0.0–40.0)

## 2020-03-13 LAB — T4, FREE: Free T4: 0.97 ng/dL (ref 0.60–1.60)

## 2020-03-13 LAB — VITAMIN B12: Vitamin B-12: 367 pg/mL (ref 211–911)

## 2020-03-13 LAB — VITAMIN D 25 HYDROXY (VIT D DEFICIENCY, FRACTURES): VITD: 100.46 ng/mL — ABNORMAL HIGH (ref 30.00–100.00)

## 2020-03-13 LAB — TSH: TSH: <0.01 u[IU]/mL — ABNORMAL LOW (ref 0.35–4.50)

## 2020-03-14 ENCOUNTER — Telehealth: Payer: Self-pay

## 2020-03-14 ENCOUNTER — Telehealth: Payer: Self-pay | Admitting: Internal Medicine

## 2020-03-14 LAB — URINALYSIS, ROUTINE W REFLEX MICROSCOPIC
Bilirubin Urine: NEGATIVE
Ketones, ur: NEGATIVE
Leukocytes,Ua: NEGATIVE
Nitrite: NEGATIVE
Specific Gravity, Urine: 1.01 (ref 1.000–1.030)
Total Protein, Urine: NEGATIVE
Urine Glucose: NEGATIVE
Urobilinogen, UA: 0.2 (ref 0.0–1.0)
pH: 6 (ref 5.0–8.0)

## 2020-03-14 LAB — ANA: Anti Nuclear Antibody (ANA): POSITIVE — AB

## 2020-03-14 LAB — ANTI-NUCLEAR AB-TITER (ANA TITER): ANA Titer 1: 1:320 {titer} — ABNORMAL HIGH

## 2020-03-14 MED ORDER — LEVOTHYROXINE SODIUM 125 MCG PO TABS: 125.0000 ug | ORAL_TABLET | Freq: Every day | ORAL | 0 refills | Status: DC

## 2020-03-14 NOTE — Telephone Encounter (Signed)
Please call her and see how she is doing-have her symptoms improved or have a change at all?    Let her know her blood work does not show anything remarkable.  Her kidney function, liver tests, blood counts are normal.  Her thyroid function is too high.  We need to decrease her thyroid dose to 125 mcg daily.  [facility]  Vitamin B12 level is normal.  Her vitamin D level is too high.  She should decrease the amount of vitamin D she is taking.  Her cholesterol is okay.  She will need to have her thyroid levels rechecked in about 6 weeks

## 2020-03-14 NOTE — Addendum Note (Signed)
Addended by: Raliegh Ip on: 03/14/2020 09:05 AM   Modules accepted: Orders

## 2020-03-17 NOTE — Telephone Encounter (Signed)
It is okay not to change her thyroid medication dose yet at this time.  Her free T4 and free T3 have been within range for a quite some time.  We will continue watching. Thanks

## 2020-03-19 DIAGNOSIS — G894 Chronic pain syndrome: Secondary | ICD-10-CM | POA: Diagnosis not present

## 2020-03-19 DIAGNOSIS — R21 Rash and other nonspecific skin eruption: Secondary | ICD-10-CM | POA: Diagnosis not present

## 2020-03-19 DIAGNOSIS — G259 Extrapyramidal and movement disorder, unspecified: Secondary | ICD-10-CM | POA: Diagnosis not present

## 2020-03-19 DIAGNOSIS — M791 Myalgia, unspecified site: Secondary | ICD-10-CM | POA: Diagnosis not present

## 2020-03-19 NOTE — Telephone Encounter (Signed)
Given to patient today. Husband came by to pick up copy of labs for patient for her to take to appointment today.

## 2020-04-30 ENCOUNTER — Other Ambulatory Visit: Payer: Self-pay | Admitting: Internal Medicine

## 2020-05-16 DIAGNOSIS — G894 Chronic pain syndrome: Secondary | ICD-10-CM | POA: Diagnosis not present

## 2020-05-16 DIAGNOSIS — G259 Extrapyramidal and movement disorder, unspecified: Secondary | ICD-10-CM | POA: Diagnosis not present

## 2020-05-16 DIAGNOSIS — M791 Myalgia, unspecified site: Secondary | ICD-10-CM | POA: Diagnosis not present

## 2020-05-16 DIAGNOSIS — R21 Rash and other nonspecific skin eruption: Secondary | ICD-10-CM | POA: Diagnosis not present

## 2020-05-20 ENCOUNTER — Other Ambulatory Visit: Payer: Self-pay | Admitting: Physician Assistant

## 2020-05-20 ENCOUNTER — Other Ambulatory Visit (HOSPITAL_COMMUNITY): Payer: Self-pay | Admitting: Physician Assistant

## 2020-05-20 DIAGNOSIS — R112 Nausea with vomiting, unspecified: Secondary | ICD-10-CM | POA: Diagnosis not present

## 2020-05-20 DIAGNOSIS — R1084 Generalized abdominal pain: Secondary | ICD-10-CM | POA: Diagnosis not present

## 2020-05-20 DIAGNOSIS — K5901 Slow transit constipation: Secondary | ICD-10-CM | POA: Diagnosis not present

## 2020-05-20 DIAGNOSIS — R748 Abnormal levels of other serum enzymes: Secondary | ICD-10-CM | POA: Diagnosis not present

## 2020-07-02 ENCOUNTER — Ambulatory Visit: Payer: BC Managed Care – PPO | Admitting: Internal Medicine

## 2020-07-02 DIAGNOSIS — G259 Extrapyramidal and movement disorder, unspecified: Secondary | ICD-10-CM | POA: Diagnosis not present

## 2020-07-02 DIAGNOSIS — M791 Myalgia, unspecified site: Secondary | ICD-10-CM | POA: Diagnosis not present

## 2020-07-02 DIAGNOSIS — G894 Chronic pain syndrome: Secondary | ICD-10-CM | POA: Diagnosis not present

## 2020-07-02 DIAGNOSIS — R21 Rash and other nonspecific skin eruption: Secondary | ICD-10-CM | POA: Diagnosis not present

## 2020-07-08 ENCOUNTER — Ambulatory Visit (HOSPITAL_COMMUNITY): Payer: BC Managed Care – PPO

## 2020-07-08 ENCOUNTER — Other Ambulatory Visit: Payer: Self-pay

## 2020-07-09 ENCOUNTER — Telehealth (INDEPENDENT_AMBULATORY_CARE_PROVIDER_SITE_OTHER): Payer: BC Managed Care – PPO | Admitting: Internal Medicine

## 2020-07-09 ENCOUNTER — Encounter: Payer: Self-pay | Admitting: Internal Medicine

## 2020-07-09 DIAGNOSIS — E038 Other specified hypothyroidism: Secondary | ICD-10-CM

## 2020-07-09 DIAGNOSIS — D508 Other iron deficiency anemias: Secondary | ICD-10-CM | POA: Diagnosis not present

## 2020-07-09 DIAGNOSIS — R768 Other specified abnormal immunological findings in serum: Secondary | ICD-10-CM

## 2020-07-09 DIAGNOSIS — G2582 Stiff-man syndrome: Secondary | ICD-10-CM

## 2020-07-09 DIAGNOSIS — E538 Deficiency of other specified B group vitamins: Secondary | ICD-10-CM

## 2020-07-09 DIAGNOSIS — G44039 Episodic paroxysmal hemicrania, not intractable: Secondary | ICD-10-CM

## 2020-07-09 DIAGNOSIS — E559 Vitamin D deficiency, unspecified: Secondary | ICD-10-CM

## 2020-07-09 DIAGNOSIS — K30 Functional dyspepsia: Secondary | ICD-10-CM | POA: Insufficient documentation

## 2020-07-09 MED ORDER — BUTALBITAL-APAP-CAFFEINE 50-325-40 MG PO TABS: 1.0000 | ORAL_TABLET | Freq: Four times a day (QID) | ORAL | 4 refills | Status: DC | PRN

## 2020-07-09 MED ORDER — LIOTHYRONINE SODIUM 25 MCG PO TABS: 12.5000 ug | ORAL_TABLET | Freq: Every day | ORAL | 3 refills | Status: DC

## 2020-07-09 MED ORDER — ONDANSETRON HCL 8 MG PO TABS: ORAL_TABLET | ORAL | 3 refills | Status: DC

## 2020-07-09 MED ORDER — LEVOTHYROXINE SODIUM 125 MCG PO TABS: 125.0000 ug | ORAL_TABLET | Freq: Every day | ORAL | 0 refills | Status: DC

## 2020-07-09 MED ORDER — DIAZEPAM 10 MG PO TABS: ORAL_TABLET | ORAL | 4 refills | Status: DC

## 2020-07-09 MED ORDER — BACLOFEN 10 MG PO TABS: 10.0000 mg | ORAL_TABLET | Freq: Two times a day (BID) | ORAL | 3 refills | Status: DC

## 2020-07-09 NOTE — Assessment & Plan Note (Signed)
Continue with Levothroid and Cytomel Obtain TSH, free T4, free T3

## 2020-07-09 NOTE — Assessment & Plan Note (Signed)
Fioricet as needed.  Prescription renewed

## 2020-07-09 NOTE — Assessment & Plan Note (Signed)
Check CBC, iron 

## 2020-07-09 NOTE — Assessment & Plan Note (Signed)
On vitamin B12.  Obtain labs

## 2020-07-09 NOTE — Progress Notes (Signed)
Virtual Visit via Video Note  I connected with Lisa Bruce on 07/09/20 at  4:00 PM EDT by a video enabled telemedicine application and verified that I am speaking with the correct person using two identifiers.   I discussed the limitations of evaluation and management by telemedicine and the availability of in person appointments. The patient expressed understanding and agreed to proceed.  I was located at home office. The patient was at home. Her husband Evangeline Gula is present in the visit.   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  The patient had some stress around her son's graduation.  She feels tired  She had a skin rash on the back that has resolved.  She is wondering about her thyroid tests.  Dr. Hardin Negus is arranging for her to see Dr. Eliberto Ivory, rheumatologist at [facility]. She is seeing Dr. 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  She is to have a GI test done, likely a gastric motility test   Observations/Objective: The patient appears to be in no acute distress, looks good.  Assessment and Plan:  See my Assessment and Plan. Follow Up Instructions:    I discussed the assessment and treatment plan with the patient. The patient was provided an opportunity to ask questions and all were answered. The patient agreed with the plan and demonstrated an understanding of the instructions.   The patient was advised to call back or seek an in-person evaluation if the symptoms worsen or if the condition fails to improve as anticipated.  I provided face-to-face time during this encounter. We were at different locations.   Walker Kehr, MD

## 2020-07-09 NOTE — Assessment & Plan Note (Signed)
On vitamin D.  Obtain labs

## 2020-07-09 NOTE — Assessment & Plan Note (Signed)
Discussed : she is seeing Dr. 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  She is to have a GI test done, likely a gastric motility test

## 2020-07-09 NOTE — Assessment & Plan Note (Signed)
Elevated ANA titers.  New swelling of the fingers.  Dr. Hardin Negus is arranging for a rheumatology appointment at [facility]

## 2020-07-09 NOTE — Assessment & Plan Note (Addendum)
The patient is seeing Dr. Hardin Negus.  She is on MS Contin.  Diazepam prescribed.  Potential benefits of a long term benzodiazepines  use as well as potential risks  and complications were explained to the patient and were aknowledged.  Drug interactions with other prescribed meds such as opioids discussed

## 2020-07-10 ENCOUNTER — Telehealth: Payer: Self-pay | Admitting: Internal Medicine

## 2020-07-10 NOTE — Telephone Encounter (Signed)
Patient called and was wondering if the dosage for levothyroxine (SYNTHROID) 125 MCG tablet could be changed back to 130mcg. She said that it can be sent to [id number] - [place], [place] - [address]. She is requesting a 4 month supply on this medication and baclofen (LIORESAL) 10 MG tablet  butalbital-acetaminophen-caffeine (FIORICET) 50-325-40 MG tablet  diazepam (VALIUM) 10 MG tablet  liothyronine (CYTOMEL) 25 MCG tablet  ondansetron (ZOFRAN) 8 MG tablet  Please advise.

## 2020-07-11 ENCOUNTER — Encounter: Payer: Self-pay | Admitting: Internal Medicine

## 2020-07-11 ENCOUNTER — Telehealth (INDEPENDENT_AMBULATORY_CARE_PROVIDER_SITE_OTHER): Payer: BC Managed Care – PPO | Admitting: Internal Medicine

## 2020-07-11 ENCOUNTER — Other Ambulatory Visit: Payer: Self-pay

## 2020-07-11 DIAGNOSIS — E038 Other specified hypothyroidism: Secondary | ICD-10-CM

## 2020-07-11 MED ORDER — LEVOTHYROXINE SODIUM 137 MCG PO CAPS: 137.0000 ug | ORAL_CAPSULE | Freq: Every day | ORAL | 3 refills | Status: DC

## 2020-07-11 NOTE — Telephone Encounter (Signed)
Patient notified that no changes will be made to the levothyroxine and she requested a telehealth visit.

## 2020-07-11 NOTE — Progress Notes (Signed)
Virtual Visit via Video Note  I connected with Lisa Bruce on 07/11/20 at 10:40 AM EDT by a video enabled telemedicine application and verified that I am speaking with the correct person using two identifiers.   I discussed the limitations of evaluation and management by telemedicine and the availability of in person appointments. The patient expressed understanding and agreed to proceed.  I was located at our home office. The patient was at home. There was no one else present in the visit.   History of Present Illness: We need to follow-up on the issue with hypothyroidism and the correct dose of the levothyroxine she is taking.  She has been taking levothyroxine 137 mcg a day.  The dose reflected in the chart (125 mcg) is incorrect.     Observations/Objective: The patient is a little distressed over this mistake and our connection problem  Assessment and Plan:  See my Assessment and Plan. Follow Up Instructions:    I discussed the assessment and treatment plan with the patient. The patient was provided an opportunity to ask questions and all were answered. The patient agreed with the plan and demonstrated an understanding of the instructions.   The patient was advised to call back or seek an in-person evaluation if the symptoms worsen or if the condition fails to improve as anticipated.  I provided face-to-face time during this encounter. We were at different locations.   Walker Kehr, MD

## 2020-07-11 NOTE — Assessment & Plan Note (Signed)
Lisa Bruce has been taking levothyroxine 137 mcg a day.  The dose reflected in the chart (125 mcg) is incorrect.  Continue with 137 mcg a day of levothyroxine and Cytomel.

## 2020-07-19 ENCOUNTER — Telehealth: Payer: Self-pay | Admitting: Internal Medicine

## 2020-07-19 DIAGNOSIS — M2550 Pain in unspecified joint: Secondary | ICD-10-CM

## 2020-07-19 NOTE — Telephone Encounter (Signed)
Patient called is requesting ANA blood work.Please advise

## 2020-07-22 DIAGNOSIS — K581 Irritable bowel syndrome with constipation: Secondary | ICD-10-CM | POA: Diagnosis not present

## 2020-07-22 DIAGNOSIS — R112 Nausea with vomiting, unspecified: Secondary | ICD-10-CM | POA: Diagnosis not present

## 2020-07-25 NOTE — Telephone Encounter (Signed)
Called and spoke with pt about lab orders being placed for her to get her blood drawn.

## 2020-07-25 NOTE — Telephone Encounter (Signed)
Okay.  I have ordered rheumatoid/ANA panel.  Thanks

## 2020-07-29 ENCOUNTER — Encounter (HOSPITAL_COMMUNITY): Payer: BC Managed Care – PPO

## 2020-07-29 ENCOUNTER — Encounter (HOSPITAL_COMMUNITY): Payer: Self-pay

## 2020-08-27 ENCOUNTER — Other Ambulatory Visit (INDEPENDENT_AMBULATORY_CARE_PROVIDER_SITE_OTHER): Payer: BC Managed Care – PPO

## 2020-08-27 DIAGNOSIS — M2550 Pain in unspecified joint: Secondary | ICD-10-CM

## 2020-08-27 DIAGNOSIS — E538 Deficiency of other specified B group vitamins: Secondary | ICD-10-CM | POA: Diagnosis not present

## 2020-08-27 DIAGNOSIS — E038 Other specified hypothyroidism: Secondary | ICD-10-CM | POA: Diagnosis not present

## 2020-08-27 DIAGNOSIS — D508 Other iron deficiency anemias: Secondary | ICD-10-CM | POA: Diagnosis not present

## 2020-08-27 DIAGNOSIS — G894 Chronic pain syndrome: Secondary | ICD-10-CM | POA: Diagnosis not present

## 2020-08-27 DIAGNOSIS — Z79891 Long term (current) use of opiate analgesic: Secondary | ICD-10-CM | POA: Diagnosis not present

## 2020-08-27 DIAGNOSIS — G2582 Stiff-man syndrome: Secondary | ICD-10-CM | POA: Diagnosis not present

## 2020-08-27 DIAGNOSIS — M791 Myalgia, unspecified site: Secondary | ICD-10-CM | POA: Diagnosis not present

## 2020-08-27 DIAGNOSIS — E559 Vitamin D deficiency, unspecified: Secondary | ICD-10-CM | POA: Diagnosis not present

## 2020-08-27 DIAGNOSIS — G259 Extrapyramidal and movement disorder, unspecified: Secondary | ICD-10-CM | POA: Diagnosis not present

## 2020-08-27 LAB — TSH: TSH: <0.01 u[IU]/mL — ABNORMAL LOW (ref 0.35–5.50)

## 2020-08-27 LAB — VITAMIN D 25 HYDROXY (VIT D DEFICIENCY, FRACTURES): VITD: 61.94 ng/mL (ref 30.00–100.00)

## 2020-08-27 LAB — COMPREHENSIVE METABOLIC PANEL
ALT: 16 U/L (ref 0–35)
AST: 20 U/L (ref 0–37)
Albumin: 4 g/dL (ref 3.5–5.2)
Alkaline Phosphatase: 81 U/L (ref 39–117)
BUN: 12 mg/dL (ref 6–23)
CO2: 25 mEq/L (ref 19–32)
Calcium: 8.9 mg/dL (ref 8.4–10.5)
Chloride: 107 mEq/L (ref 96–112)
Creatinine, Ser: 0.8 mg/dL (ref 0.40–1.20)
GFR: 83.53 mL/min (ref >60.00)
Glucose, Bld: 88 mg/dL (ref 70–99)
Potassium: 3.7 mEq/L (ref 3.5–5.1)
Sodium: 138 mEq/L (ref 135–145)
Total Bilirubin: 0.3 mg/dL (ref 0.2–1.2)
Total Protein: 7.4 g/dL (ref 6.0–8.3)

## 2020-08-27 LAB — CBC WITH DIFFERENTIAL/PLATELET
Basophils Absolute: 0 10*3/uL (ref 0.0–0.1)
Basophils Relative: 1 % (ref 0.0–3.0)
Eosinophils Absolute: 0 10*3/uL (ref 0.0–0.7)
Eosinophils Relative: 1 % (ref 0.0–5.0)
HCT: 34.2 % — ABNORMAL LOW (ref 36.0–46.0)
Hemoglobin: 11.6 g/dL — ABNORMAL LOW (ref 12.0–15.0)
Lymphocytes Relative: 51.6 % — ABNORMAL HIGH (ref 12.0–46.0)
Lymphs Abs: 1.7 10*3/uL (ref 0.7–4.0)
MCHC: 34 g/dL (ref 30.0–36.0)
MCV: 91.3 fl (ref 78.0–100.0)
Monocytes Absolute: 0.2 10*3/uL (ref 0.1–1.0)
Monocytes Relative: 5.9 % (ref 3.0–12.0)
Neutro Abs: 1.3 10*3/uL — ABNORMAL LOW (ref 1.4–7.7)
Neutrophils Relative %: 40.5 % — ABNORMAL LOW (ref 43.0–77.0)
Platelets: 185 10*3/uL (ref 150.0–400.0)
RBC: 3.75 Mil/uL — ABNORMAL LOW (ref 3.87–5.11)
RDW: 14.1 % (ref 11.5–15.5)
WBC: 3.3 10*3/uL — ABNORMAL LOW (ref 4.0–10.5)

## 2020-08-27 LAB — T3, FREE: T3, Free: 3.7 pg/mL (ref 2.3–4.2)

## 2020-08-27 LAB — VITAMIN B12: Vitamin B-12: 256 pg/mL (ref 211–911)

## 2020-08-27 LAB — C-REACTIVE PROTEIN: CRP: <1 mg/dL (ref 0.5–20.0)

## 2020-08-27 LAB — T4, FREE: Free T4: 1.08 ng/dL (ref 0.60–1.60)

## 2020-08-27 LAB — SEDIMENTATION RATE: Sed Rate: 17 mm/hr (ref 0–30)

## 2020-08-28 LAB — IRON,TIBC AND FERRITIN PANEL
%SAT: 36 % (calc) (ref 16–45)
Ferritin: 14 ng/mL — ABNORMAL LOW (ref 16–232)
Iron: 98 ug/dL (ref 45–160)
TIBC: 276 mcg/dL (calc) (ref 250–450)

## 2020-08-28 LAB — RHEUMATOID FACTOR: Rheumatoid fact SerPl-aCnc: <14 IU/mL (ref <14)

## 2020-08-28 LAB — CYCLIC CITRUL PEPTIDE ANTIBODY, IGG: Cyclic Citrullin Peptide Ab: <16 UNITS

## 2020-09-05 ENCOUNTER — Other Ambulatory Visit: Payer: Self-pay | Admitting: Internal Medicine

## 2020-09-07 LAB — ANA+ENA+DNA/DS+ANTICH+CENTR
ANA Titer 1: NEGATIVE
Anti JO-1: <0.2 AI (ref 0.0–0.9)
Centromere Ab Screen: <0.2 AI (ref 0.0–0.9)
Chromatin Ab SerPl-aCnc: <0.2 AI (ref 0.0–0.9)
ENA RNP Ab: <0.2 AI (ref 0.0–0.9)
ENA SM Ab Ser-aCnc: <0.2 AI (ref 0.0–0.9)
ENA SSA (RO) Ab: <0.2 AI (ref 0.0–0.9)
ENA SSB (LA) Ab: <0.2 AI (ref 0.0–0.9)
Scleroderma (Scl-70) (ENA) Antibody, IgG: <0.2 AI (ref 0.0–0.9)
dsDNA Ab: 1 IU/mL (ref 0–9)

## 2020-10-02 DIAGNOSIS — M2550 Pain in unspecified joint: Secondary | ICD-10-CM | POA: Diagnosis not present

## 2020-10-02 DIAGNOSIS — R61 Generalized hyperhidrosis: Secondary | ICD-10-CM | POA: Diagnosis not present

## 2020-10-02 DIAGNOSIS — M19042 Primary osteoarthritis, left hand: Secondary | ICD-10-CM | POA: Diagnosis not present

## 2020-10-02 DIAGNOSIS — Z885 Allergy status to narcotic agent status: Secondary | ICD-10-CM | POA: Diagnosis not present

## 2020-10-02 DIAGNOSIS — R760 Raised antibody titer: Secondary | ICD-10-CM | POA: Diagnosis not present

## 2020-10-02 DIAGNOSIS — G2582 Stiff-man syndrome: Secondary | ICD-10-CM | POA: Diagnosis not present

## 2020-10-02 DIAGNOSIS — M79641 Pain in right hand: Secondary | ICD-10-CM | POA: Diagnosis not present

## 2020-10-02 DIAGNOSIS — M79642 Pain in left hand: Secondary | ICD-10-CM | POA: Diagnosis not present

## 2020-10-02 DIAGNOSIS — M19041 Primary osteoarthritis, right hand: Secondary | ICD-10-CM | POA: Diagnosis not present

## 2020-10-07 ENCOUNTER — Telehealth: Payer: Self-pay | Admitting: Internal Medicine

## 2020-10-07 DIAGNOSIS — Z20822 Contact with and (suspected) exposure to covid-19: Secondary | ICD-10-CM | POA: Diagnosis not present

## 2020-10-07 DIAGNOSIS — U071 COVID-19: Secondary | ICD-10-CM | POA: Diagnosis not present

## 2020-10-07 NOTE — Telephone Encounter (Signed)
Patient tested positive today. Sx started on 10/04/20. Sx: fever,cough, headache, muscle aches, fatigue, sore throat, and congestion.   Please advise.   [id number] [place], [place] - [address] Phone:  [phone] Fax:  [phone]

## 2020-10-08 NOTE — Telephone Encounter (Signed)
Is she positive for COVID? If yes: For a mild COVID-19 case - take zinc 50 mg a day for 1 week, vitamin C 1000 mg daily for 1 week. Take Allegra or Benadryl.  Maintain good oral hydration and take Tylenol for high fever.  Sch VOV w/ any provider today.  Call if problems. Isolate for 5 days, then wear a mask for 5 days per CDC.

## 2020-10-08 NOTE — Telephone Encounter (Signed)
Notified pt w/MD response.../lmb 

## 2020-10-14 ENCOUNTER — Telehealth (INDEPENDENT_AMBULATORY_CARE_PROVIDER_SITE_OTHER): Payer: BC Managed Care – PPO | Admitting: Internal Medicine

## 2020-10-14 DIAGNOSIS — E038 Other specified hypothyroidism: Secondary | ICD-10-CM

## 2020-10-14 DIAGNOSIS — J209 Acute bronchitis, unspecified: Secondary | ICD-10-CM

## 2020-10-14 DIAGNOSIS — G2582 Stiff-man syndrome: Secondary | ICD-10-CM

## 2020-10-14 DIAGNOSIS — R61 Generalized hyperhidrosis: Secondary | ICD-10-CM | POA: Diagnosis not present

## 2020-10-14 DIAGNOSIS — U071 COVID-19: Secondary | ICD-10-CM | POA: Diagnosis not present

## 2020-10-14 MED ORDER — ONDANSETRON HCL 8 MG PO TABS: ORAL_TABLET | ORAL | 3 refills | Status: DC

## 2020-10-14 MED ORDER — BACLOFEN 10 MG PO TABS: 10.0000 mg | ORAL_TABLET | Freq: Two times a day (BID) | ORAL | 3 refills | Status: DC

## 2020-10-14 MED ORDER — CEFDINIR 300 MG PO CAPS: 300.0000 mg | ORAL_CAPSULE | Freq: Two times a day (BID) | ORAL | 0 refills | Status: DC

## 2020-10-14 MED ORDER — GLYCOPYRROLATE 2 MG PO TABS: 2.0000 mg | ORAL_TABLET | Freq: Three times a day (TID) | ORAL | 3 refills | Status: DC

## 2020-10-14 MED ORDER — DIAZEPAM 10 MG PO TABS: ORAL_TABLET | ORAL | 4 refills | Status: DC

## 2020-10-14 MED ORDER — PREDNISONE 5 MG PO TABS: ORAL_TABLET | ORAL | 0 refills | Status: DC

## 2020-10-14 MED ORDER — LIOTHYRONINE SODIUM 25 MCG PO TABS: 12.5000 ug | ORAL_TABLET | Freq: Every day | ORAL | 3 refills | Status: DC

## 2020-10-14 MED ORDER — LEVOTHYROXINE SODIUM 137 MCG PO CAPS: 137.0000 ug | ORAL_CAPSULE | Freq: Every day | ORAL | 3 refills | Status: DC

## 2020-10-14 MED ORDER — BUTALBITAL-APAP-CAFFEINE 50-325-40 MG PO TABS
1.0000 | ORAL_TABLET | Freq: Four times a day (QID) | ORAL | 4 refills | Status: DC | PRN
Start: 2020-10-14 — End: 2021-01-20

## 2020-10-16 ENCOUNTER — Telehealth: Payer: Self-pay | Admitting: Internal Medicine

## 2020-10-16 NOTE — Telephone Encounter (Signed)
Patient calling back in to also add that she has not starting taking the prednisone that provider also prescribed

## 2020-10-16 NOTE — Telephone Encounter (Signed)
She is allergic to erythromycin which belongs to the same family of medicines as Zithromax - can't use it. Lisa Bruce can try to take cefdinir with food and use Zofran for nausea.  Thanks

## 2020-10-16 NOTE — Telephone Encounter (Signed)
Patient says antibiotic cefdinir (OMNICEF) 300 MG capsule is making her severely sick  Having stomach aches as well as having trouble sleeping due to having nightmares  Wants to know if provider is willing to call her in a z-pack or something else so she can stop taking the other antibiotic  Please follow up [phone]  Pharmacy:   [id number] [place], [place] - [address]  Phone:  [phone]:  [phone]

## 2020-10-16 NOTE — Telephone Encounter (Signed)
Patient/ spouse calling again to request Z-pac  Advised spouse allow more time for provider to respond

## 2020-10-17 NOTE — Telephone Encounter (Signed)
Patient calling back in.. Says she cant keep taking the cefdinir bc it is making her continuously have a BM & the zofran is not helping  Patient says she is not allergic to erythromycin .. says she took it once before & had a small rash but that was it... says she is not allergic & would like for provider to send it in to the pharmacy for her before she leaves to go out of town

## 2020-10-17 NOTE — Telephone Encounter (Signed)
Pt's husband verified with the pt that she is not allergic to erythromycin and has previously used the z-pack for many years. Stated the cefdinir and zofran is not helping. Patient's mother passed and will be leaving out of town today.   Please advise.

## 2020-10-18 ENCOUNTER — Encounter: Payer: Self-pay | Admitting: Internal Medicine

## 2020-10-18 DIAGNOSIS — R61 Generalized hyperhidrosis: Secondary | ICD-10-CM | POA: Insufficient documentation

## 2020-10-18 DIAGNOSIS — U071 COVID-19: Secondary | ICD-10-CM | POA: Insufficient documentation

## 2020-10-18 DIAGNOSIS — J209 Acute bronchitis, unspecified: Secondary | ICD-10-CM | POA: Insufficient documentation

## 2020-10-18 MED ORDER — AZITHROMYCIN 250 MG PO TABS: ORAL_TABLET | ORAL | 0 refills | Status: DC

## 2020-10-18 NOTE — Telephone Encounter (Signed)
Okay Z-Pak.  Thanks 

## 2020-10-18 NOTE — Assessment & Plan Note (Signed)
Continue with Levothroid and Cytomel

## 2020-10-18 NOTE — Telephone Encounter (Signed)
Notified pt MD sent rx to [facility]../lmb 

## 2020-10-18 NOTE — Assessment & Plan Note (Signed)
Lisa Bruce tested positive for COVID 19.  She is not feeling well.  She is very upset because her mother was diagnosed with cerebral hemorrhage.  She is currently in the intensive care unit in [place].  Lisa Bruce would like to go there and visit with her mom. I asked him to not to rush with her travel plans, especially if her mother is unconscious.  We discussed contagious nature of the COVID-19 virus and current CDC guidelines.  We discussed thrombophilic properties of [id number] and DVT prophylaxis if/when she decides to travel.  I do not think antiviral meds are indicated at the moment.

## 2020-10-18 NOTE — Assessment & Plan Note (Signed)
Bacterial bronchitis complicated [id number].  Prescribed Z-Pak.  Lisa Bruce indicated she can take erythromycin and Z-Pak.  Originally prescribed cefdinir was discontinued due to side effects.

## 2020-10-18 NOTE — Assessment & Plan Note (Signed)
Fioricet for headaches as needed Diazepam as needed  Potential benefits of a long term benzodiazepines  use as well as potential risks  and complications were explained to the patient and were aknowledged.

## 2020-10-18 NOTE — Assessment & Plan Note (Signed)
Likely multifactorial-menopause, med side effect, autonomic dysfunction are likely reasons.  Options to treat/investigate were discussed.  Obtain chest x-ray

## 2020-10-18 NOTE — Progress Notes (Signed)
Virtual Visit via Video Note  I connected with Lisa Bruce on 10/18/20 at  4:00 PM EDT by a video enabled telemedicine application and verified that I am speaking with the correct person using two identifiers.   I discussed the limitations of evaluation and management by telemedicine and the availability of in person appointments. The patient expressed understanding and agreed to proceed.  I was located at our [facility] office. The patient was at home. Her husband Lisa Bruce was present in the visit.  Lisa Bruce tested positive for COVID 19.  She is not feeling well.  She is very upset because her mother was diagnosed with cerebral hemorrhage.  She is currently in the intensive care unit in [place].  Lisa Bruce would like to go there and visit with her mom.  C/o severe sweating since Jan 2022 F/u on muscle spasticity    There has been complaining of congested nose, cough, chest Diagnosis, some shortness of breath, abdominal pain, arthralgias.   Observations/Objective: The patient appears to be in no acute distress, looks anxious  Assessment and Plan:  See my Assessment and Plan. Follow Up Instructions:    I discussed the assessment and treatment plan with the patient. The patient was provided an opportunity to ask questions and all were answered. The patient agreed with the plan and demonstrated an understanding of the instructions.   The patient was advised to call back or seek an in-person evaluation if the symptoms worsen or if the condition fails to improve as anticipated.  I provided face-to-face time during this encounter. We were at different locations.   Walker Kehr, MD

## 2020-10-30 DIAGNOSIS — Z79891 Long term (current) use of opiate analgesic: Secondary | ICD-10-CM | POA: Diagnosis not present

## 2020-10-30 DIAGNOSIS — M791 Myalgia, unspecified site: Secondary | ICD-10-CM | POA: Diagnosis not present

## 2020-10-30 DIAGNOSIS — G259 Extrapyramidal and movement disorder, unspecified: Secondary | ICD-10-CM | POA: Diagnosis not present

## 2020-10-30 DIAGNOSIS — G894 Chronic pain syndrome: Secondary | ICD-10-CM | POA: Diagnosis not present

## 2020-11-05 ENCOUNTER — Telehealth: Payer: Self-pay | Admitting: *Deleted

## 2020-11-05 NOTE — Telephone Encounter (Signed)
Per referral Dr.Phillips - called and lvm of  upcoming appointments - requested callback to confirm - mailed welcome packet with calendar.

## 2020-11-25 ENCOUNTER — Inpatient Hospital Stay: Payer: BC Managed Care – PPO | Attending: Hematology & Oncology

## 2020-11-25 ENCOUNTER — Inpatient Hospital Stay: Payer: BC Managed Care – PPO | Admitting: Hematology & Oncology

## 2020-11-25 ENCOUNTER — Other Ambulatory Visit: Payer: Self-pay

## 2020-11-25 ENCOUNTER — Encounter: Payer: Self-pay | Admitting: Hematology & Oncology

## 2020-11-25 VITALS — BP 115/74 | HR 64 | Temp 98.6°F | Resp 18 | Ht 66.0 in | Wt 152.0 lb

## 2020-11-25 DIAGNOSIS — Z808 Family history of malignant neoplasm of other organs or systems: Secondary | ICD-10-CM | POA: Diagnosis not present

## 2020-11-25 DIAGNOSIS — R531 Weakness: Secondary | ICD-10-CM | POA: Insufficient documentation

## 2020-11-25 DIAGNOSIS — D472 Monoclonal gammopathy: Secondary | ICD-10-CM | POA: Insufficient documentation

## 2020-11-25 DIAGNOSIS — M2550 Pain in unspecified joint: Secondary | ICD-10-CM | POA: Insufficient documentation

## 2020-11-25 DIAGNOSIS — Z8616 Personal history of COVID-19: Secondary | ICD-10-CM | POA: Insufficient documentation

## 2020-11-25 DIAGNOSIS — M549 Dorsalgia, unspecified: Secondary | ICD-10-CM | POA: Diagnosis not present

## 2020-11-25 DIAGNOSIS — G249 Dystonia, unspecified: Secondary | ICD-10-CM | POA: Insufficient documentation

## 2020-11-25 DIAGNOSIS — R634 Abnormal weight loss: Secondary | ICD-10-CM | POA: Insufficient documentation

## 2020-11-25 DIAGNOSIS — G7249 Other inflammatory and immune myopathies, not elsewhere classified: Secondary | ICD-10-CM | POA: Diagnosis not present

## 2020-11-25 DIAGNOSIS — R5381 Other malaise: Secondary | ICD-10-CM | POA: Diagnosis not present

## 2020-11-25 DIAGNOSIS — R110 Nausea: Secondary | ICD-10-CM | POA: Diagnosis not present

## 2020-11-25 DIAGNOSIS — K5900 Constipation, unspecified: Secondary | ICD-10-CM | POA: Diagnosis not present

## 2020-11-25 DIAGNOSIS — M791 Myalgia, unspecified site: Secondary | ICD-10-CM | POA: Diagnosis not present

## 2020-11-25 DIAGNOSIS — R519 Headache, unspecified: Secondary | ICD-10-CM | POA: Insufficient documentation

## 2020-11-25 DIAGNOSIS — K3189 Other diseases of stomach and duodenum: Secondary | ICD-10-CM | POA: Insufficient documentation

## 2020-11-25 DIAGNOSIS — M542 Cervicalgia: Secondary | ICD-10-CM | POA: Insufficient documentation

## 2020-11-25 DIAGNOSIS — R5383 Other fatigue: Secondary | ICD-10-CM | POA: Diagnosis not present

## 2020-11-25 DIAGNOSIS — Z8639 Personal history of other endocrine, nutritional and metabolic disease: Secondary | ICD-10-CM | POA: Diagnosis not present

## 2020-11-25 DIAGNOSIS — R002 Palpitations: Secondary | ICD-10-CM | POA: Diagnosis not present

## 2020-11-25 LAB — CBC WITH DIFFERENTIAL (CANCER CENTER ONLY)
Abs Immature Granulocytes: 0 10*3/uL (ref 0.00–0.07)
Basophils Absolute: 0 10*3/uL (ref 0.0–0.1)
Basophils Relative: 1 %
Eosinophils Absolute: 0 10*3/uL (ref 0.0–0.5)
Eosinophils Relative: 1 %
HCT: 35.6 % — ABNORMAL LOW (ref 36.0–46.0)
Hemoglobin: 12.1 g/dL (ref 12.0–15.0)
Immature Granulocytes: 0 %
Lymphocytes Relative: 47 %
Lymphs Abs: 1.6 10*3/uL (ref 0.7–4.0)
MCH: 30.6 pg (ref 26.0–34.0)
MCHC: 34 g/dL (ref 30.0–36.0)
MCV: 89.9 fL (ref 80.0–100.0)
Monocytes Absolute: 0.2 10*3/uL (ref 0.1–1.0)
Monocytes Relative: 7 %
Neutro Abs: 1.4 10*3/uL — ABNORMAL LOW (ref 1.7–7.7)
Neutrophils Relative %: 44 %
Platelet Count: 178 10*3/uL (ref 150–400)
RBC: 3.96 MIL/uL (ref 3.87–5.11)
RDW: 12.9 % (ref 11.5–15.5)
WBC Count: 3.3 10*3/uL — ABNORMAL LOW (ref 4.0–10.5)
nRBC: 0 % (ref 0.0–0.2)

## 2020-11-25 LAB — CMP (CANCER CENTER ONLY)
ALT: 15 U/L (ref 0–44)
AST: 17 U/L (ref 15–41)
Albumin: 4.2 g/dL (ref 3.5–5.0)
Alkaline Phosphatase: 85 U/L (ref 38–126)
Anion gap: 9 (ref 5–15)
BUN: 12 mg/dL (ref 6–20)
CO2: 23 mmol/L (ref 22–32)
Calcium: 9.7 mg/dL (ref 8.9–10.3)
Chloride: 105 mmol/L (ref 98–111)
Creatinine: 0.88 mg/dL (ref 0.44–1.00)
GFR, Estimated: >60 mL/min (ref >60)
Glucose, Bld: 76 mg/dL (ref 70–99)
Potassium: 3.5 mmol/L (ref 3.5–5.1)
Sodium: 137 mmol/L (ref 135–145)
Total Bilirubin: 0.3 mg/dL (ref 0.3–1.2)
Total Protein: 7.1 g/dL (ref 6.5–8.1)

## 2020-11-25 LAB — LACTATE DEHYDROGENASE: LDH: 117 U/L (ref 98–192)

## 2020-11-25 NOTE — Progress Notes (Signed)
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  HPI: Lisa Bruce is a very nice 55 year old white female.  We actually has seen her 5 years ago for iron deficiency anemia.  She clearly has some underlying neuromuscular disorder.  She looks very stiff.  She has had a muscle biopsy back in 2006.  This was read at [facility].  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  She has been to numerous doctors.  She recent was a seen by rheumatology.  She says that rheumatology cannot help her.  She says that neurology cannot help her.  Somewhere along the line, someone did a SPEP on her.  I am not sure why this was done.  It did show that there was a monoclonal spike.  Unfortunate I cannot find the result anywhere.  Records that were sent to [place] did not include an SPEP.  She has had no problem with fever.  There is been no problems with infections.  She did have COVID last year.  She has had no bleeding.  There is been no change in bowel or bladder habits.  I think she does see gastroenterology because of gastric irritation.  Unfortunately a lot of her doctors have retired that she likes.  She has had no obvious rashes.  There is been some joint changes.  Again she is very stiff.  She has her hands curled up.  She says that she has dystonia.  She does not smoke.  She does not drink.  She has no obvious occupational exposures.  Has been no change in medications.  She was, referred back to the [facility] because of this MGUS.  Overall, I would say her performance status is probably ECOG 2.    Past Medical History:  Diagnosis Date   Anemia 09/02/2014   Anxiety    Basal cell carcinoma    Multiple   Neuromuscular disorder (HCC)    Thyroid disease   :   Past Surgical History:  Procedure Laterality Date   CHOLECYSTECTOMY      TONSILLECTOMY AND ADENOIDECTOMY    :   Current Outpatient Medications:    Bacillus Coagulans-Inulin (ALIGN PREBIOTIC-PROBIOTIC PO), Take 1 capsule by mouth., Disp: , Rfl:    baclofen (LIORESAL) 10 MG tablet, Take 1 tablet (10 mg total) by mouth 2 (two) times daily., Disp: 180 tablet, Rfl: 3   benzoyl peroxide 10 % gel, Apply topically at bedtime., Disp: , Rfl:    bisacodyl (DULCOLAX) 5 MG EC tablet, Take by mouth., Disp: , Rfl:    butalbital-acetaminophen-caffeine (FIORICET) 50-325-40 MG tablet, Take 1 tablet by mouth every 6 (six) hours as needed for headache., Disp: 60 tablet, Rfl: 4   Cholecalciferol (VITAMIN D3) 125 MCG (5000 UT) TABS, Take 1 tablet by mouth., Disp: , Rfl:    ciclopirox (PENLAC) 8 % solution, APPLY OVER NAIL AND SURROUNDING SKIN AT BEDTIME, APPLY DAILY OVER PREVIOUS COAT, AFTER 7 DAYS MAY REMOVE WITH ALCOHOL AND THEN CONTINUE CYCLE, Disp: 6.6 mL, Rfl: 2   diazepam (VALIUM) 10 MG tablet, TAKE 3 TABLETS AT 6AM, 3 TABLETS AT NOON, 3 TABLETS AT 5PM, 4 TABLETS AT BEDTIME, Disp: 390 tablet, Rfl: 4   ibuprofen (ADVIL,MOTRIN) 200 MG tablet, Take by mouth., Disp: , Rfl:    linaclotide (LINZESS) 145 MCG CAPS capsule, Take 1 capsule (  145 mcg total) by mouth 1 day or 1 dose., Disp: 90 capsule, Rfl: 3   liothyronine (CYTOMEL) 25 MCG tablet, Take 0.5 tablets (12.5 mcg total) by mouth daily., Disp: 90 tablet, Rfl: 3   morphine (MS CONTIN) 30 MG 12 hr tablet, Take 1 tablet by mouth every 8 (eight) hours as needed., Disp: , Rfl: 0   ondansetron (ZOFRAN) 8 MG tablet, TAKE 0.5-1 TABLETS (4-8 MG TOTAL) BY MOUTH EVERY 8 (EIGHT) HOURS AS NEEDED FOR NAUSEA OR VOMITING., Disp: 20 tablet, Rfl: 3   predniSONE (DELTASONE) 5 MG tablet, Take 10 mg po qam x 7 days, then 5 mg po q am x 7 days, then stop. Take pc., Disp: 21 tablet, Rfl: 0   SYNTHROID 137 MCG tablet, Take 137 mcg by mouth every morning., Disp: , Rfl: :  :   Allergies  Allergen Reactions   Penicillins Rash   Benadryl  [Diphenhydramine] Other (See Comments)    Patient becomes hyperactive and anxious    Cefdinir     Diarrhea, nausea   Codeine Nausea And Vomiting   Fentanyl     vomiting   Gabapentin Other (See Comments)    Sick,loopy   Imuran [Azathioprine Sodium]     Nausea, chills   Lyrica [Pregabalin]     loopy   Methotrexate Derivatives     vomiting   Tizanidine Other (See Comments)    Leg pain   Trazodone And Nefazodone Nausea And Vomiting   Zanaflex [Tizanidine Hcl]     weak  :   Family History  Problem Relation Age of Onset   Cancer Father 23       melanoma   Scleroderma Brother   :   Social History   Socioeconomic History   Marital status: Married    Spouse name: Lisa Bruce   Number of children: 2   Years of education: 14   Highest education level: Not on file  Occupational History   Occupation: other    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   Caffeine Use: occasionally   Social Determinants of Health   Financial Resource Strain: Not on file  Food Insecurity: Not on file  Transportation Needs: Not on file  Physical Activity: Not on file  Stress: Not on file  Social Connections: Not on file  Intimate Partner Violence: Not on file  : Review of Systems  Constitutional:  Positive for malaise/fatigue and weight loss.  HENT: Negative.    Eyes: Negative.   Respiratory: Negative.    Cardiovascular:  Positive for palpitations.  Gastrointestinal:  Positive for constipation and nausea.  Genitourinary: Negative.   Musculoskeletal:  Positive for back pain, falls, joint pain, myalgias and neck pain.  Skin: Negative.   Neurological:  Positive for focal weakness, weakness and headaches.  Endo/Heme/Allergies: Negative.    Psychiatric/Behavioral: Negative.      Exam: _0 @ This is a fairly well-developed well-nourished white female in no obvious distress.  Vital signs show temperature of 98.6.  Pulse 64.  Blood pressure 115/74.  Weight is 152 pounds.  Head and neck exam shows no ocular or oral lesions.  She has no scleral icterus.  Her pupils react appropriately.  Lungs are  clear bilaterally.  Cardiac exam regular rate and rhythm with no murmurs, rubs or bruits.  Abdomen is soft.  She has decent bowel sounds.  There is no fluid wave.  There is no palpable liver or spleen tip.  Back exam shows no tenderness over the spine, ribs or hips.  Extremities shows no clubbing, cyanosis or edema.  She has decreased range of motion of a lot of her joints.  She has stiffness in her joints.  Skin exam shows no rashes.  Neurological exam shows no focal neurological deficits.   Recent Labs    11/25/20 1358  WBC 3.3*  HGB 12.1  HCT 35.6*  PLT 178    Recent Labs    11/25/20 1358  NA 137  K 3.5  CL 105  CO2 23  GLUCOSE 76  BUN 12  CREATININE 0.88  CALCIUM 9.7    Blood smear review: None  Pathology: None    Assessment and Plan: Lisa Bruce is a very nice 55 year old white female.  She clearly has some type of underlying rheumatologic issue.  I would have thought that this is scleroderma.  However, she has been checked extensively.  She says that no matter what lactate she has, everything comes back normal.  Again I have no quantitative assessment of this M spike that she had.  We are checking this again.  The one unifying diagnosis that might be possible as amyloidosis.  We are checking a 24-hour urine on her.  I told her to try to give [place] much urine as possible as this will give [place] the most accurate result.  We will see what the blood studies show.  She may need to actually have a bone marrow biopsy done.  We will see if this might be needed.  Again I did have a hard time believing that his M spike is  can be anything pathologic.  I would have to believe that given her underlying neuromuscular issues, that the M spike is going to be reactive.  She has no problems with anemia.  There is no renal insufficiency.  There is no hypercalcemia.  I do not think we have to do any x-rays on her right now.  I cannot imagine that there is any underlying malignancy.    I just feel bad for her.  She really is having a tough time.  She walks very stiffly.  She just has a hard time moving her joints.  We will plan to get her back once we see what the results are from the 24-hour urine in the blood studies.

## 2020-11-26 ENCOUNTER — Telehealth: Payer: Self-pay | Admitting: *Deleted

## 2020-11-26 LAB — KAPPA/LAMBDA LIGHT CHAINS
Kappa free light chain: 29.1 mg/L — ABNORMAL HIGH (ref 3.3–19.4)
Kappa, lambda light chain ratio: 2.05 — ABNORMAL HIGH (ref 0.26–1.65)
Lambda free light chains: 14.2 mg/L (ref 5.7–26.3)

## 2020-11-26 LAB — IGG, IGA, IGM
IgA: 237 mg/dL (ref 87–352)
IgG (Immunoglobin G), Serum: 1744 mg/dL — ABNORMAL HIGH (ref 586–1602)
IgM (Immunoglobulin M), Srm: 127 mg/dL (ref 26–217)

## 2020-11-26 LAB — BETA 2 MICROGLOBULIN, SERUM: Beta-2 Microglobulin: 3.5 mg/L — ABNORMAL HIGH (ref 0.6–2.4)

## 2020-11-26 NOTE — Telephone Encounter (Signed)
No 11/25/20 LOS

## 2020-11-29 LAB — PROTEIN ELECTROPHORESIS, SERUM, WITH REFLEX
A/G Ratio: 1.1 (ref 0.7–1.7)
Albumin ELP: 3.8 g/dL (ref 2.9–4.4)
Alpha-1-Globulin: 0.2 g/dL (ref 0.0–0.4)
Alpha-2-Globulin: 0.7 g/dL (ref 0.4–1.0)
Beta Globulin: 0.9 g/dL (ref 0.7–1.3)
Gamma Globulin: 1.6 g/dL (ref 0.4–1.8)
Globulin, Total: 3.4 g/dL (ref 2.2–3.9)
M-Spike, %: 0.5 g/dL — ABNORMAL HIGH
SPEP Interpretation: 0
Total Protein ELP: 7.2 g/dL (ref 6.0–8.5)

## 2020-11-29 LAB — IMMUNOFIXATION REFLEX, SERUM
IgA: 255 mg/dL (ref 87–352)
IgG (Immunoglobin G), Serum: 1759 mg/dL — ABNORMAL HIGH (ref 586–1602)
IgM (Immunoglobulin M), Srm: 129 mg/dL (ref 26–217)

## 2020-12-07 ENCOUNTER — Other Ambulatory Visit: Payer: Self-pay | Admitting: Hematology & Oncology

## 2020-12-07 DIAGNOSIS — D472 Monoclonal gammopathy: Secondary | ICD-10-CM

## 2020-12-12 ENCOUNTER — Telehealth: Payer: Self-pay | Admitting: *Deleted

## 2020-12-12 NOTE — Telephone Encounter (Signed)
Returned patient's phone call regarding bone marrow procedure. Patient stated,"Dr. Marin Olp called me Saturday and I missed his call. He wants me to have a bone marrow biopsy. My husband and I have questions about that. I have muscular problems, I'm allergic to some anesthesia medications, I always get nauseated and vomit afterwards, I can barely eat anymore, I don't get out of the house much because I'm in the bed. Do I really need this procedure?" I told her that Dr. Marin Olp was out of the office this week, and that I will give him this message. I discussed with her that a bone marrow biopsy gives [place] information in determining a diagnosis. She verbalized understanding.

## 2020-12-27 ENCOUNTER — Telehealth: Payer: Self-pay | Admitting: *Deleted

## 2020-12-27 NOTE — Telephone Encounter (Signed)
Message received from patient requesting that lab results from 11/25/20 be faxed to [facility] at [phone] per pt.'s request.

## 2020-12-30 DIAGNOSIS — Z79891 Long term (current) use of opiate analgesic: Secondary | ICD-10-CM | POA: Diagnosis not present

## 2020-12-30 DIAGNOSIS — G259 Extrapyramidal and movement disorder, unspecified: Secondary | ICD-10-CM | POA: Diagnosis not present

## 2020-12-30 DIAGNOSIS — G894 Chronic pain syndrome: Secondary | ICD-10-CM | POA: Diagnosis not present

## 2020-12-30 DIAGNOSIS — M791 Myalgia, unspecified site: Secondary | ICD-10-CM | POA: Diagnosis not present

## 2021-01-20 ENCOUNTER — Encounter: Payer: Self-pay | Admitting: Internal Medicine

## 2021-01-20 ENCOUNTER — Other Ambulatory Visit: Payer: Self-pay

## 2021-01-20 ENCOUNTER — Ambulatory Visit: Payer: BC Managed Care – PPO | Admitting: Internal Medicine

## 2021-01-20 DIAGNOSIS — R768 Other specified abnormal immunological findings in serum: Secondary | ICD-10-CM

## 2021-01-20 DIAGNOSIS — D472 Monoclonal gammopathy: Secondary | ICD-10-CM | POA: Diagnosis not present

## 2021-01-20 DIAGNOSIS — E038 Other specified hypothyroidism: Secondary | ICD-10-CM | POA: Diagnosis not present

## 2021-01-20 DIAGNOSIS — R634 Abnormal weight loss: Secondary | ICD-10-CM

## 2021-01-20 DIAGNOSIS — R110 Nausea: Secondary | ICD-10-CM

## 2021-01-20 DIAGNOSIS — G2582 Stiff-man syndrome: Secondary | ICD-10-CM

## 2021-01-20 MED ORDER — LIOTHYRONINE SODIUM 25 MCG PO TABS: 12.5000 ug | ORAL_TABLET | Freq: Every day | ORAL | 3 refills | Status: DC

## 2021-01-20 MED ORDER — ONDANSETRON HCL 8 MG PO TABS: ORAL_TABLET | ORAL | 3 refills | Status: DC

## 2021-01-20 MED ORDER — BUTALBITAL-APAP-CAFFEINE 50-325-40 MG PO TABS: 1.0000 | ORAL_TABLET | Freq: Four times a day (QID) | ORAL | 4 refills | Status: DC | PRN

## 2021-01-20 MED ORDER — BACLOFEN 10 MG PO TABS: 10.0000 mg | ORAL_TABLET | Freq: Two times a day (BID) | ORAL | 3 refills | Status: DC

## 2021-01-20 MED ORDER — SYNTHROID 137 MCG PO TABS: 137.0000 ug | ORAL_TABLET | Freq: Every morning | ORAL | 3 refills | Status: DC

## 2021-01-20 MED ORDER — CICLOPIROX 8 % EX SOLN: Freq: Every day | CUTANEOUS | 2 refills | Status: DC

## 2021-01-20 MED ORDER — DIAZEPAM 10 MG PO TABS: ORAL_TABLET | ORAL | 4 refills | Status: DC

## 2021-01-20 NOTE — Progress Notes (Signed)
Subjective:  Patient ID: Lisa Bruce, female    DOB: [birth year]  Age: 55 y.o. MRN: [id number]  CC: Follow-up (1 month f/u)   HPI Lisa Bruce presents for reaction to Cefdinir - diarrhea, spastic condition, HAs, hypothyroidism  Mother died of COVID  Outpatient Medications Prior to Visit  Medication Sig Dispense Refill   Bacillus Coagulans-Inulin (ALIGN PREBIOTIC-PROBIOTIC PO) Take 1 capsule by mouth.     benzoyl peroxide 10 % gel Apply topically at bedtime.     bisacodyl (DULCOLAX) 5 MG EC tablet Take by mouth.     Cholecalciferol (VITAMIN D3) 125 MCG (5000 UT) TABS Take 1 tablet by mouth.     ibuprofen (ADVIL,MOTRIN) 200 MG tablet Take by mouth.     linaclotide (LINZESS) 145 MCG CAPS capsule Take 1 capsule (145 mcg total) by mouth 1 day or 1 dose. 90 capsule 3   morphine (MS CONTIN) 30 MG 12 hr tablet Take 1 tablet by mouth every 8 (eight) hours as needed.  0   baclofen (LIORESAL) 10 MG tablet Take 1 tablet (10 mg total) by mouth 2 (two) times daily. 180 tablet 3   butalbital-acetaminophen-caffeine (FIORICET) 50-325-40 MG tablet Take 1 tablet by mouth every 6 (six) hours as needed for headache. 60 tablet 4   ciclopirox (PENLAC) 8 % solution APPLY OVER NAIL AND SURROUNDING SKIN AT BEDTIME, APPLY DAILY OVER PREVIOUS COAT, AFTER 7 DAYS MAY REMOVE WITH ALCOHOL AND THEN CONTINUE CYCLE 6.6 mL 2   diazepam (VALIUM) 10 MG tablet TAKE 3 TABLETS AT 6AM, 3 TABLETS AT NOON, 3 TABLETS AT 5PM, 4 TABLETS AT BEDTIME 390 tablet 4   liothyronine (CYTOMEL) 25 MCG tablet Take 0.5 tablets (12.5 mcg total) by mouth daily. 90 tablet 3   ondansetron (ZOFRAN) 8 MG tablet TAKE 0.5-1 TABLETS (4-8 MG TOTAL) BY MOUTH EVERY 8 (EIGHT) HOURS AS NEEDED FOR NAUSEA OR VOMITING. 20 tablet 3   SYNTHROID 137 MCG tablet Take 137 mcg by mouth every morning.     predniSONE (DELTASONE) 5 MG tablet Take 10 mg po qam x 7 days, then 5 mg po q am x 7 days, then stop. Take pc. (Patient not taking: Reported on 01/20/2021) 21  tablet 0   No facility-administered medications prior to visit.    ROS: Review of Systems  Constitutional:  Positive for fatigue. Negative for activity change, appetite change, chills and unexpected weight change.  HENT:  Negative for congestion, mouth sores and sinus pressure.   Eyes:  Negative for visual disturbance.  Respiratory:  Negative for cough and chest tightness.   Gastrointestinal:  Positive for nausea. Negative for abdominal pain.  Genitourinary:  Negative for difficulty urinating, frequency and vaginal pain.  Musculoskeletal:  Positive for arthralgias, back pain and gait problem.  Skin:  Negative for pallor and rash.  Neurological:  Positive for dizziness, tremors, weakness and headaches. Negative for numbness.  Psychiatric/Behavioral:  Negative for confusion, sleep disturbance and suicidal ideas. The patient is nervous/anxious.    Objective:  BP 118/79 (BP Location: Left Arm)   Wt 147 lb 6.4 oz (66.9 kg)   BMI 23.79 kg/m   BP Readings from Last 3 Encounters:  01/20/21 118/79  11/25/20 115/74  03/12/20 110/82    Wt Readings from Last 3 Encounters:  01/20/21 147 lb 6.4 oz (66.9 kg)  11/25/20 152 lb (68.9 kg)  11/02/19 151 lb (68.5 kg)    Physical Exam Constitutional:      General: She is not in acute distress.  Appearance: She is well-developed.  HENT:     Head: Normocephalic.     Right Ear: External ear normal.     Left Ear: External ear normal.     Nose: Nose normal.  Eyes:     General:        Right eye: No discharge.        Left eye: No discharge.     Conjunctiva/sclera: Conjunctivae normal.     Pupils: Pupils are equal, round, and reactive to light.  Neck:     Thyroid: No thyromegaly.     Vascular: No JVD.     Trachea: No tracheal deviation.  Cardiovascular:     Rate and Rhythm: Normal rate and regular rhythm.     Heart sounds: Normal heart sounds.  Pulmonary:     Effort: No respiratory distress.     Breath sounds: No stridor. No wheezing.   Abdominal:     General: Bowel sounds are normal. There is no distension.     Palpations: Abdomen is soft. There is no mass.     Tenderness: There is no abdominal tenderness. There is no guarding or rebound.  Musculoskeletal:        General: Tenderness present.     Cervical back: Normal range of motion and neck supple. No rigidity.  Lymphadenopathy:     Cervical: No cervical adenopathy.  Skin:    Findings: No erythema or rash.  Neurological:     Mental Status: She is oriented to person, place, and time. Mental status is at baseline.     Cranial Nerves: No cranial nerve deficit.     Motor: No abnormal muscle tone.     Coordination: Coordination abnormal.     Gait: Gait abnormal.  Psychiatric:        Behavior: Behavior normal.        Thought Content: Thought content normal.        Judgment: Judgment normal.    Lab Results  Component Value Date   WBC 3.3 (L) 11/25/2020   HGB 12.1 11/25/2020   HCT 35.6 (L) 11/25/2020   PLT 178 11/25/2020   GLUCOSE 76 11/25/2020   CHOL 228 (H) 03/12/2020   TRIG 140.0 03/12/2020   HDL 69.20 03/12/2020   LDLDIRECT 180.1 05/22/2010   LDLCALC 131 (H) 03/12/2020   ALT 15 11/25/2020   AST 17 11/25/2020   NA 137 11/25/2020   K 3.5 11/25/2020   CL 105 11/25/2020   CREATININE 0.88 11/25/2020   BUN 12 11/25/2020   CO2 23 11/25/2020   TSH <0.01 (L) 08/27/2020    DG UGI W DOUBLE CM (HD BA)  Result Date: 09/07/2018 CLINICAL DATA:  Pharyngeal esophageal dysphagia.  Loss of appetite. EXAM: UPPER GI SERIES WITHOUT KUB TECHNIQUE: Routine upper GI series was performed with thin/high density/water soluble barium. FLUOROSCOPY TIME:  Fluoroscopy Time:  5 minutes and 48 seconds. Radiation Exposure Index (if provided by the fluoroscopic device): 273 mGy Number of Acquired Spot Images: COMPARISON:  Two-view abdomen 09/06/2018 FINDINGS: Frontal and lateral views of the hypopharynx while swallowing are remarkable for laryngeal penetration to the level of the vocal  cords on 1 of the initial swallows. No frank aspiration. No episodes of aspiration were observed during the remainder of the exam. Due to limitations related to the patient's clinical status/limited mobility, single contrast study was performed. Despite single-contrast study, upright assessment of the esophagus generates a double-contrast appearance due to spontaneous swallowing of air. There is no evidence for esophageal diverticulum, esophageal  mass lesion, or gross mucosal ulceration. Mild narrowing of the distal esophagus evident. A 13 mm barium tablet lodges at this area of smooth mild narrowing in the distal esophagus. Patient was given 5 alternating swallows of thin barium and water and the tablet did ultimately pass into the stomach on the fifth attempt while drinking water. Assessment of esophageal motility shows preservation of the primary peristaltic stripping wave on 4/5 swallows with some minimal proximal escape. Mild tertiary contractions noted in the distal third of the esophagus. No evidence for presbyesophagus. No evidence for hiatal hernia. Single contrast imaging of the stomach with spot compression shows no gastric mass lesion or gross mucosal ulceration. Gastric emptying is prompt. Pylorus and duodenal bulb are normal. By 15 minutes after administration of the first swallow of barium, no migration of barium beyond the ligament of Treitz is observed. IMPRESSION: 1. Gradual, smooth, mild narrowing of the distal esophagus just proximal to the esophagogastric junction. A 13 mm barium tablet becomes lodged at this location through multiple repeat swallows of thin barium and water before passing into the stomach. 2. Good preservation of primary peristaltic stripping wave with some tertiary contractions noted in the distal third of the esophagus. 3. Despite prompt gastric emptying, sluggish peristalsis noted in the duodenum. By 15 minutes after starting the procedure, no barium had migrated into the  jejunum. 4. Stomach is normally positioned without evidence of hiatal hernia. No gross ulceration or mass lesion on single contrast imaging. 5. Laryngeal penetration noted on initial swallows of thick barium with penetration to the level of the vocal cords. No frank aspiration below the level of the cords. No subsequent episodes of aspiration were observed. Electronically Signed   By: Misty Stanley M.D.   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    Dispense:  180 tablet    Refill:  3   butalbital-acetaminophen-caffeine (FIORICET) 50-325-40 MG tablet    Sig: Take 1 tablet by mouth every 6 (six) hours as needed for headache.    Dispense:  60 tablet    Refill:  4   ondansetron (ZOFRAN) 8 MG tablet    Sig: TAKE 0.5-1 TABLETS (4-8 MG TOTAL) BY  MOUTH EVERY 8 (EIGHT) HOURS AS NEEDED FOR NAUSEA OR VOMITING.    Dispense:  20 tablet    Refill:  3   SYNTHROID 137 MCG tablet    Sig: Take 1 tablet (137 mcg total)  by mouth every morning.    Dispense:  90 tablet    Refill:  3   liothyronine (CYTOMEL) 25 MCG tablet    Sig: Take 0.5 tablets (12.5 mcg total) by mouth daily.    Dispense:  90 tablet    Refill:  3   ciclopirox (PENLAC) 8 % solution    Sig: Apply topically at bedtime. Apply over nail and surrounding skin. Apply daily over previous coat. After seven (7) days, may remove with alcohol and continue cycle.    Dispense:  6.6 mL    Refill:  2       Follow-up: Return in about 3 months (around 04/20/2021) for a follow-up visit.  Walker Kehr, MD

## 2021-01-20 NOTE — Assessment & Plan Note (Addendum)
F/u w/Dr Marin Olp Bone marrow bx is pending

## 2021-01-20 NOTE — Assessment & Plan Note (Signed)
Recurrent nausea syndrome as needed Using Zofran prn  Use apples, candied ginger for nausea

## 2021-01-20 NOTE — Assessment & Plan Note (Signed)
Chronic  Levothroid, Cytomel combo helps the pt the best

## 2021-01-20 NOTE — Assessment & Plan Note (Signed)
[name] saw Dr Baxter Flattery ([facility])

## 2021-01-20 NOTE — Patient Instructions (Signed)
Use apples, candied ginger for nausea

## 2021-01-20 NOTE — Assessment & Plan Note (Signed)
Lisa Bruce saw Dr Baxter Flattery ([facility])

## 2021-01-20 NOTE — Assessment & Plan Note (Signed)
  IV iron prn 

## 2021-01-27 DIAGNOSIS — K581 Irritable bowel syndrome with constipation: Secondary | ICD-10-CM | POA: Diagnosis not present

## 2021-01-27 DIAGNOSIS — R112 Nausea with vomiting, unspecified: Secondary | ICD-10-CM | POA: Diagnosis not present

## 2021-02-24 DIAGNOSIS — M791 Myalgia, unspecified site: Secondary | ICD-10-CM | POA: Diagnosis not present

## 2021-02-24 DIAGNOSIS — G894 Chronic pain syndrome: Secondary | ICD-10-CM | POA: Diagnosis not present

## 2021-02-24 DIAGNOSIS — G259 Extrapyramidal and movement disorder, unspecified: Secondary | ICD-10-CM | POA: Diagnosis not present

## 2021-02-24 DIAGNOSIS — Z79891 Long term (current) use of opiate analgesic: Secondary | ICD-10-CM | POA: Diagnosis not present

## 2021-03-24 ENCOUNTER — Encounter (HOSPITAL_COMMUNITY): Payer: Self-pay

## 2021-03-24 ENCOUNTER — Ambulatory Visit (HOSPITAL_COMMUNITY): Payer: BC Managed Care – PPO

## 2021-04-22 DIAGNOSIS — G894 Chronic pain syndrome: Secondary | ICD-10-CM | POA: Diagnosis not present

## 2021-04-22 DIAGNOSIS — G259 Extrapyramidal and movement disorder, unspecified: Secondary | ICD-10-CM | POA: Diagnosis not present

## 2021-04-22 DIAGNOSIS — Z79891 Long term (current) use of opiate analgesic: Secondary | ICD-10-CM | POA: Diagnosis not present

## 2021-04-22 DIAGNOSIS — M791 Myalgia, unspecified site: Secondary | ICD-10-CM | POA: Diagnosis not present

## 2021-05-05 ENCOUNTER — Other Ambulatory Visit: Payer: Self-pay

## 2021-05-05 ENCOUNTER — Encounter: Payer: Self-pay | Admitting: Internal Medicine

## 2021-05-05 ENCOUNTER — Ambulatory Visit: Payer: BC Managed Care – PPO | Admitting: Internal Medicine

## 2021-05-05 VITALS — BP 124/70 | Temp 98.2°F | Ht 66.0 in | Wt 150.0 lb

## 2021-05-05 DIAGNOSIS — R630 Anorexia: Secondary | ICD-10-CM | POA: Insufficient documentation

## 2021-05-05 DIAGNOSIS — R739 Hyperglycemia, unspecified: Secondary | ICD-10-CM | POA: Diagnosis not present

## 2021-05-05 DIAGNOSIS — E559 Vitamin D deficiency, unspecified: Secondary | ICD-10-CM | POA: Diagnosis not present

## 2021-05-05 DIAGNOSIS — G709 Myoneural disorder, unspecified: Secondary | ICD-10-CM | POA: Insufficient documentation

## 2021-05-05 DIAGNOSIS — R1915 Other abnormal bowel sounds: Secondary | ICD-10-CM | POA: Insufficient documentation

## 2021-05-05 DIAGNOSIS — E538 Deficiency of other specified B group vitamins: Secondary | ICD-10-CM | POA: Diagnosis not present

## 2021-05-05 DIAGNOSIS — E2740 Unspecified adrenocortical insufficiency: Secondary | ICD-10-CM | POA: Insufficient documentation

## 2021-05-05 DIAGNOSIS — R1033 Periumbilical pain: Secondary | ICD-10-CM | POA: Insufficient documentation

## 2021-05-05 DIAGNOSIS — F1110 Opioid abuse, uncomplicated: Secondary | ICD-10-CM | POA: Insufficient documentation

## 2021-05-05 DIAGNOSIS — E038 Other specified hypothyroidism: Secondary | ICD-10-CM

## 2021-05-05 DIAGNOSIS — D508 Other iron deficiency anemias: Secondary | ICD-10-CM

## 2021-05-05 DIAGNOSIS — Z1211 Encounter for screening for malignant neoplasm of colon: Secondary | ICD-10-CM | POA: Insufficient documentation

## 2021-05-05 DIAGNOSIS — M1990 Unspecified osteoarthritis, unspecified site: Secondary | ICD-10-CM | POA: Insufficient documentation

## 2021-05-05 DIAGNOSIS — R5382 Chronic fatigue, unspecified: Secondary | ICD-10-CM | POA: Diagnosis not present

## 2021-05-05 DIAGNOSIS — D472 Monoclonal gammopathy: Secondary | ICD-10-CM

## 2021-05-05 DIAGNOSIS — M6289 Other specified disorders of muscle: Secondary | ICD-10-CM | POA: Diagnosis not present

## 2021-05-05 DIAGNOSIS — R1310 Dysphagia, unspecified: Secondary | ICD-10-CM | POA: Insufficient documentation

## 2021-05-05 DIAGNOSIS — R1084 Generalized abdominal pain: Secondary | ICD-10-CM | POA: Insufficient documentation

## 2021-05-05 DIAGNOSIS — R748 Abnormal levels of other serum enzymes: Secondary | ICD-10-CM | POA: Insufficient documentation

## 2021-05-05 DIAGNOSIS — F1120 Opioid dependence, uncomplicated: Secondary | ICD-10-CM | POA: Insufficient documentation

## 2021-05-05 DIAGNOSIS — M2550 Pain in unspecified joint: Secondary | ICD-10-CM | POA: Insufficient documentation

## 2021-05-05 NOTE — Progress Notes (Signed)
? ?Subjective:  ?Patient ID: Lisa Bruce, female    DOB: 02/09/1966  Age: 56 y.o. MRN: [id number] ? ?CC: No chief complaint on file. ? ? ?HPI ?Lisa Bruce presents for CFS, stiffness, MGUS.  Him is here with her husband Darren who helps with history ? ?Outpatient Medications Prior to Visit  ?Medication Sig Dispense Refill  ? Bacillus Coagulans-Inulin (ALIGN PREBIOTIC-PROBIOTIC PO) Take 1 capsule by mouth.    ? baclofen (LIORESAL) 10 MG tablet Take 1 tablet (10 mg total) by mouth 2 (two) times daily. 180 tablet 3  ? benzoyl peroxide 10 % gel Apply topically at bedtime.    ? bisacodyl (DULCOLAX) 5 MG EC tablet Take by mouth.    ? butalbital-acetaminophen-caffeine (FIORICET) 50-325-40 MG tablet Take 1 tablet by mouth every 6 (six) hours as needed for headache. 60 tablet 4  ? Cholecalciferol (VITAMIN D3) 125 MCG (5000 UT) TABS Take 1 tablet by mouth.    ? ciclopirox (PENLAC) 8 % solution Apply topically at bedtime. Apply over nail and surrounding skin. Apply daily over previous coat. After seven (7) days, may remove with alcohol and continue cycle. 6.6 mL 2  ? diazepam (VALIUM) 10 MG tablet TAKE 3 TABLETS AT 6AM, 3 TABLETS AT NOON, 3 TABLETS AT 5PM, 4 TABLETS AT BEDTIME 390 tablet 4  ? ibuprofen (ADVIL,MOTRIN) 200 MG tablet Take by mouth.    ? linaclotide (LINZESS) 145 MCG CAPS capsule Take 1 capsule (145 mcg total) by mouth 1 day or 1 dose. 90 capsule 3  ? liothyronine (CYTOMEL) 25 MCG tablet Take 0.5 tablets (12.5 mcg total) by mouth daily. 90 tablet 3  ? morphine (MS CONTIN) 30 MG 12 hr tablet Take 1 tablet by mouth every 8 (eight) hours as needed.  0  ? ondansetron (ZOFRAN) 8 MG tablet TAKE 0.5-1 TABLETS (4-8 MG TOTAL) BY MOUTH EVERY 8 (EIGHT) HOURS AS NEEDED FOR NAUSEA OR VOMITING. 20 tablet 3  ? SYNTHROID 137 MCG tablet Take 1 tablet (137 mcg total) by mouth every morning. 90 tablet 3  ? ?No facility-administered medications prior to visit.  ? ? ?ROS: ?Review of Systems  ?Constitutional:  Negative for  activity change, appetite change, chills, fatigue and unexpected weight change.  ?HENT:  Negative for congestion, mouth sores and sinus pressure.   ?Eyes:  Negative for visual disturbance.  ?Respiratory:  Negative for cough and chest tightness.   ?Gastrointestinal:  Negative for abdominal pain and nausea.  ?Genitourinary:  Negative for difficulty urinating, frequency and vaginal pain.  ?Musculoskeletal:  Positive for arthralgias and gait problem. Negative for back pain.  ?Skin:  Negative for pallor and rash.  ?Neurological:  Positive for tremors, speech difficulty, weakness, light-headedness and headaches. Negative for dizziness and numbness.  ?Psychiatric/Behavioral:  Positive for decreased concentration, dysphoric mood and sleep disturbance. Negative for confusion and suicidal ideas. The patient is nervous/anxious.   ? ?Objective:  ?BP 124/70 (BP Location: Left Arm, Patient Position: Sitting, Cuff Size: Large)   Temp 98.2 ?F (36.8 ?C) (Oral)   Ht '5\' 6"'$  (1.676 m)   Wt 150 lb (68 kg)   BMI 24.21 kg/m?  ? ?BP Readings from Last 3 Encounters:  ?05/05/21 124/70  ?01/20/21 118/79  ?11/25/20 115/74  ? ? ?Wt Readings from Last 3 Encounters:  ?05/05/21 150 lb (68 kg)  ?01/20/21 147 lb 6.4 oz (66.9 kg)  ?11/25/20 152 lb (68.9 kg)  ? ? ?Physical Exam ?Constitutional:   ?   General: She is not in acute distress. ?  Appearance: She is well-developed.  ?HENT:  ?   Head: Normocephalic.  ?   Right Ear: External ear normal.  ?   Left Ear: External ear normal.  ?   Nose: Nose normal.  ?Eyes:  ?   General:     ?   Right eye: No discharge.     ?   Left eye: No discharge.  ?   Conjunctiva/sclera: Conjunctivae normal.  ?   Pupils: Pupils are equal, round, and reactive to light.  ?Neck:  ?   Thyroid: No thyromegaly.  ?   Vascular: No JVD.  ?   Trachea: No tracheal deviation.  ?Cardiovascular:  ?   Rate and Rhythm: Normal rate and regular rhythm.  ?   Heart sounds: Normal heart sounds.  ?Pulmonary:  ?   Effort: No respiratory  distress.  ?   Breath sounds: No stridor. No wheezing.  ?Abdominal:  ?   General: Bowel sounds are normal. There is no distension.  ?   Palpations: Abdomen is soft. There is no mass.  ?   Tenderness: There is no abdominal tenderness. There is no guarding or rebound.  ?Musculoskeletal:     ?   General: Tenderness present.  ?   Cervical back: Normal range of motion and neck supple. No rigidity.  ?Lymphadenopathy:  ?   Cervical: No cervical adenopathy.  ?Skin: ?   Findings: No erythema or rash.  ?Neurological:  ?   Mental Status: She is oriented to person, place, and time.  ?   Cranial Nerves: No cranial nerve deficit.  ?   Motor: Weakness present. No abnormal muscle tone.  ?   Coordination: Coordination abnormal.  ?   Gait: Gait abnormal.  ?   Deep Tendon Reflexes: Reflexes abnormal.  ?Psychiatric:     ?   Behavior: Behavior normal.     ?   Thought Content: Thought content normal.     ?   Judgment: Judgment normal.  ?Spastic upper and lower extremity.  Poor balance.  Spastic gait ?Somewhat spastic speech ? ?Lab Results  ?Component Value Date  ? WBC 3.3 (L) 11/25/2020  ? HGB 12.1 11/25/2020  ? HCT 35.6 (L) 11/25/2020  ? PLT 178 11/25/2020  ? GLUCOSE 76 11/25/2020  ? CHOL 228 (H) 03/12/2020  ? TRIG 140.0 03/12/2020  ? HDL 69.20 03/12/2020  ? LDLDIRECT 180.1 05/22/2010  ? LDLCALC 131 (H) 03/12/2020  ? ALT 15 11/25/2020  ? AST 17 11/25/2020  ? NA 137 11/25/2020  ? K 3.5 11/25/2020  ? CL 105 11/25/2020  ? CREATININE 0.88 11/25/2020  ? BUN 12 11/25/2020  ? CO2 23 11/25/2020  ? TSH <0.01 (L) 08/27/2020  ? ? ?DG UGI W DOUBLE CM (HD BA) ? ?Result Date: 09/07/2018 ?CLINICAL DATA:  Pharyngeal esophageal dysphagia.  Loss of appetite. EXAM: UPPER GI SERIES WITHOUT KUB TECHNIQUE: Routine upper GI series was performed with thin/high density/water soluble barium. FLUOROSCOPY TIME:  Fluoroscopy Time:  5 minutes and 48 seconds. Radiation Exposure Index (if provided by the fluoroscopic device): 273 mGy Number of Acquired Spot Images:  COMPARISON:  Two-view abdomen 09/06/2018 FINDINGS: Frontal and lateral views of the hypopharynx while swallowing are remarkable for laryngeal penetration to the level of the vocal cords on 1 of the initial swallows. No frank aspiration. No episodes of aspiration were observed during the remainder of the exam. Due to limitations related to the patient's clinical status/limited mobility, single contrast study was performed. Despite single-contrast study, upright assessment of the  esophagus generates a double-contrast appearance due to spontaneous swallowing of air. There is no evidence for esophageal diverticulum, esophageal mass lesion, or gross mucosal ulceration. Mild narrowing of the distal esophagus evident. A 13 mm barium tablet lodges at this area of smooth mild narrowing in the distal esophagus. Patient was given 5 alternating swallows of thin barium and water and the tablet did ultimately pass into the stomach on the fifth attempt while drinking water. Assessment of esophageal motility shows preservation of the primary peristaltic stripping wave on 4/5 swallows with some minimal proximal escape. Mild tertiary contractions noted in the distal third of the esophagus. No evidence for presbyesophagus. No evidence for hiatal hernia. Single contrast imaging of the stomach with spot compression shows no gastric mass lesion or gross mucosal ulceration. Gastric emptying is prompt. Pylorus and duodenal bulb are normal. By 15 minutes after administration of the first swallow of barium, no migration of barium beyond the ligament of Treitz is observed. IMPRESSION: 1. Gradual, smooth, mild narrowing of the distal esophagus just proximal to the esophagogastric junction. A 13 mm barium tablet becomes lodged at this location through multiple repeat swallows of thin barium and water before passing into the stomach. 2. Good preservation of primary peristaltic stripping wave with some tertiary contractions noted in the distal  third of the esophagus. 3. Despite prompt gastric emptying, sluggish peristalsis noted in the duodenum. By 15 minutes after starting the procedure, no barium had migrated into the jejunum. 4. Stomach is normally positio

## 2021-05-05 NOTE — Assessment & Plan Note (Addendum)
F/u w/Dr Marin Olp.  Lisa Bruce was supposed to have a bone marrow biopsy last fall.  She did not have it done. ?

## 2021-05-06 LAB — CBC WITH DIFFERENTIAL/PLATELET
Basophils Absolute: 0 10*3/uL (ref 0.0–0.1)
Basophils Relative: 0.5 % (ref 0.0–3.0)
Eosinophils Absolute: 0 10*3/uL (ref 0.0–0.7)
Eosinophils Relative: 1 % (ref 0.0–5.0)
HCT: 35.4 % — ABNORMAL LOW (ref 36.0–46.0)
Hemoglobin: 12.2 g/dL (ref 12.0–15.0)
Lymphocytes Relative: 70.5 % — ABNORMAL HIGH (ref 12.0–46.0)
Lymphs Abs: 2.4 10*3/uL (ref 0.7–4.0)
MCHC: 34.4 g/dL (ref 30.0–36.0)
MCV: 89.7 fl (ref 78.0–100.0)
Monocytes Absolute: 0.2 10*3/uL (ref 0.1–1.0)
Monocytes Relative: 4.9 % (ref 3.0–12.0)
Neutro Abs: 0.8 10*3/uL — ABNORMAL LOW (ref 1.4–7.7)
Neutrophils Relative %: 23.1 % — ABNORMAL LOW (ref 43.0–77.0)
Platelets: 157 10*3/uL (ref 150.0–400.0)
RBC: 3.94 Mil/uL (ref 3.87–5.11)
RDW: 14.3 % (ref 11.5–15.5)
WBC: 3.4 10*3/uL — ABNORMAL LOW (ref 4.0–10.5)

## 2021-05-06 LAB — COMPREHENSIVE METABOLIC PANEL
ALT: 19 U/L (ref 0–35)
AST: 26 U/L (ref 0–37)
Albumin: 4 g/dL (ref 3.5–5.2)
Alkaline Phosphatase: 83 U/L (ref 39–117)
BUN: 11 mg/dL (ref 6–23)
CO2: 25 mEq/L (ref 19–32)
Calcium: 9 mg/dL (ref 8.4–10.5)
Chloride: 103 mEq/L (ref 96–112)
Creatinine, Ser: 0.84 mg/dL (ref 0.40–1.20)
GFR: 78.4 mL/min (ref >60.00)
Glucose, Bld: 75 mg/dL (ref 70–99)
Potassium: 3.7 mEq/L (ref 3.5–5.1)
Sodium: 135 mEq/L (ref 135–145)
Total Bilirubin: 0.3 mg/dL (ref 0.2–1.2)
Total Protein: 7.3 g/dL (ref 6.0–8.3)

## 2021-05-06 LAB — VITAMIN B12: Vitamin B-12: 284 pg/mL (ref 211–911)

## 2021-05-06 LAB — VITAMIN D 25 HYDROXY (VIT D DEFICIENCY, FRACTURES): VITD: 54.13 ng/mL (ref 30.00–100.00)

## 2021-05-06 LAB — TSH: TSH: 0.01 u[IU]/mL — ABNORMAL LOW (ref 0.35–5.50)

## 2021-05-06 LAB — T4, FREE: Free T4: 1.04 ng/dL (ref 0.60–1.60)

## 2021-05-06 LAB — HEMOGLOBIN A1C: Hgb A1c MFr Bld: 5.2 % (ref 4.6–6.5)

## 2021-05-06 MED ORDER — LIOTHYRONINE SODIUM 25 MCG PO TABS
12.5000 ug | ORAL_TABLET | Freq: Every day | ORAL | 3 refills | Status: DC
Start: 2021-05-06 — End: 2021-08-04

## 2021-05-06 MED ORDER — BUTALBITAL-APAP-CAFFEINE 50-325-40 MG PO TABS: 1.0000 | ORAL_TABLET | Freq: Four times a day (QID) | ORAL | 4 refills | Status: DC | PRN

## 2021-05-06 MED ORDER — SYNTHROID 137 MCG PO TABS: 137.0000 ug | ORAL_TABLET | Freq: Every morning | ORAL | 3 refills | Status: DC

## 2021-05-06 MED ORDER — DIAZEPAM 10 MG PO TABS: ORAL_TABLET | ORAL | 4 refills | Status: DC

## 2021-05-06 MED ORDER — BACLOFEN 10 MG PO TABS: 10.0000 mg | ORAL_TABLET | Freq: Two times a day (BID) | ORAL | 3 refills | Status: DC

## 2021-05-06 MED ORDER — CICLOPIROX 8 % EX SOLN: Freq: Every day | CUTANEOUS | 2 refills | Status: DC

## 2021-05-06 MED ORDER — ONDANSETRON HCL 8 MG PO TABS: ORAL_TABLET | ORAL | 3 refills | Status: DC

## 2021-05-06 NOTE — Assessment & Plan Note (Signed)
Chronic.  We will obtain thyroid tests.  Renew meds ?

## 2021-05-06 NOTE — Assessment & Plan Note (Signed)
Lisa Bruce was supposed to have a bone marrow biopsy in the fall 2022 for MGUS.  She did not have it done.  Schedule follow-up with Dr. Marin Olp ?

## 2021-05-06 NOTE — Assessment & Plan Note (Signed)
Chronic.  Obtain vitamin B12 level ?

## 2021-05-06 NOTE — Assessment & Plan Note (Signed)
Chronic.  Obtain vitamin D level ?

## 2021-05-07 ENCOUNTER — Telehealth: Payer: Self-pay | Admitting: Internal Medicine

## 2021-05-07 NOTE — Telephone Encounter (Signed)
Patient would like copy of recent lab results mailed to mailing address on file ?

## 2021-05-07 NOTE — Telephone Encounter (Signed)
Lab results mailed.

## 2021-05-26 ENCOUNTER — Ambulatory Visit: Payer: BC Managed Care – PPO | Admitting: Internal Medicine

## 2021-06-05 NOTE — Telephone Encounter (Signed)
Pt inquiring if iron levels were checked on 05-05-2021 ? ?Please advise ?

## 2021-06-05 NOTE — Telephone Encounter (Signed)
[name]'s blood count was normal.  There was no need to check iron.   ?We can check the iron next time if needed. ?Thanks ?

## 2021-06-05 NOTE — Telephone Encounter (Signed)
Per chart no iron level check. Do u want to order iron.Marland KitchenJohny Bruce ?

## 2021-06-06 NOTE — Telephone Encounter (Signed)
Called pt no answer LMOM w/MD response../lmb 

## 2021-06-09 DIAGNOSIS — G709 Myoneural disorder, unspecified: Secondary | ICD-10-CM | POA: Diagnosis not present

## 2021-06-09 DIAGNOSIS — K581 Irritable bowel syndrome with constipation: Secondary | ICD-10-CM | POA: Diagnosis not present

## 2021-06-09 DIAGNOSIS — F1120 Opioid dependence, uncomplicated: Secondary | ICD-10-CM | POA: Diagnosis not present

## 2021-06-09 DIAGNOSIS — K5904 Chronic idiopathic constipation: Secondary | ICD-10-CM | POA: Diagnosis not present

## 2021-06-30 DIAGNOSIS — Z79891 Long term (current) use of opiate analgesic: Secondary | ICD-10-CM | POA: Diagnosis not present

## 2021-06-30 DIAGNOSIS — G894 Chronic pain syndrome: Secondary | ICD-10-CM | POA: Diagnosis not present

## 2021-06-30 DIAGNOSIS — M791 Myalgia, unspecified site: Secondary | ICD-10-CM | POA: Diagnosis not present

## 2021-06-30 DIAGNOSIS — G259 Extrapyramidal and movement disorder, unspecified: Secondary | ICD-10-CM | POA: Diagnosis not present

## 2021-07-03 IMAGING — RF UPPER GI SERIES W/HIGH DENSITY WITHOUT KUB
13 of 17 series · 13 of 24 positions shown · non-contrast
Comparison: Two-view abdomen 09/06/2018

CLINICAL DATA: Pharyngeal esophageal dysphagia.  Loss of appetite.

EXAM:
UPPER GI SERIES WITHOUT KUB
TECHNIQUE: Routine upper GI series was performed with thin/high density/water
soluble barium.
FLUOROSCOPY TIME:  Fluoroscopy Time:  5 minutes and 48 seconds.
Radiation Exposure Index (if provided by the fluoroscopic device):
273 mGy
Number of Acquired Spot Images:

[Series 1: sequence · 0.29mm/px · 1 of 19 frames shown (1 of 12)]
[frame 3/19]
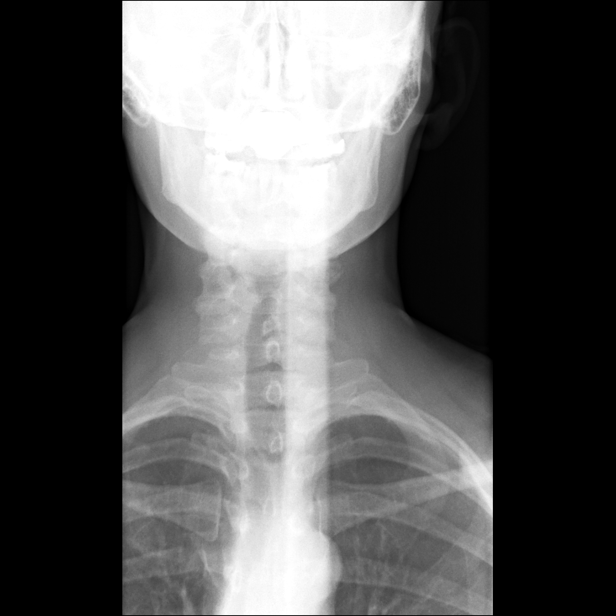

[Series 2: sequence · 0.29mm/px · 1 of 23 frames shown (2 of 12)]
[frame 12/23]
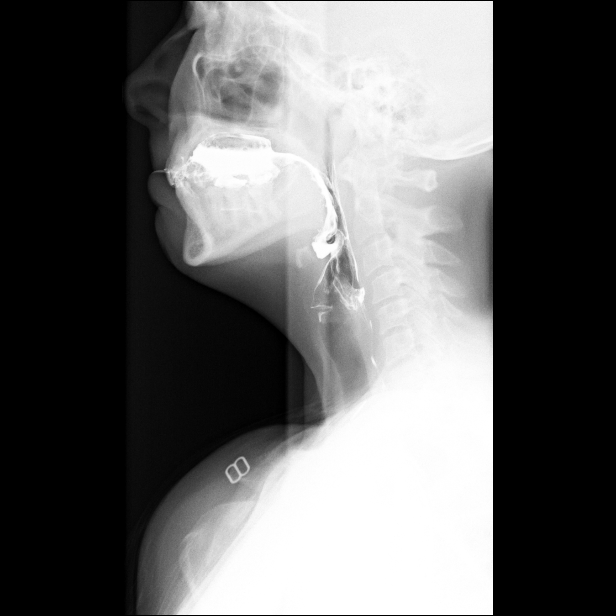

[Series 3: sequence · 0.29mm/px · 1 of 5 frames shown (3 of 12)]
[frame 5/5]
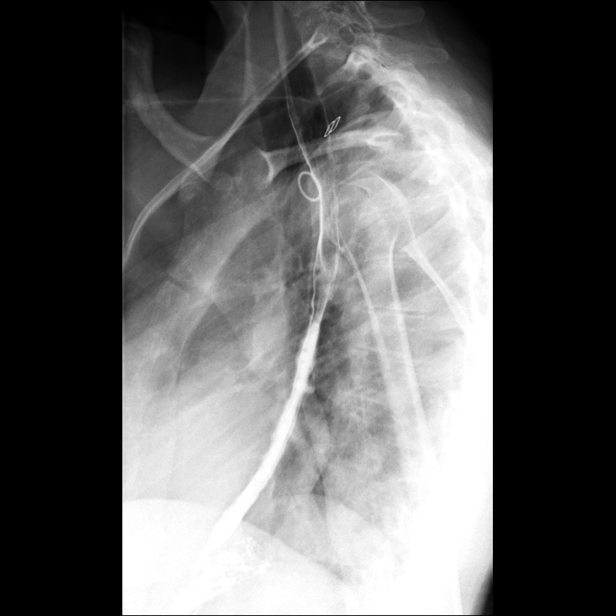

[Series 6: sequence · 0.30mm/px · 1 of 1 slices shown (4 of 12)]
[im 1/1]
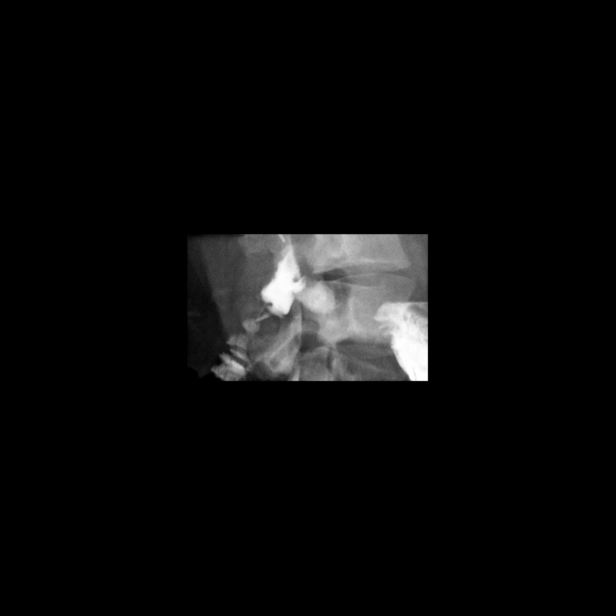

[Series 11: one shot · 0.15mm/px · 1 of 4 slices shown]
[im 1/4]
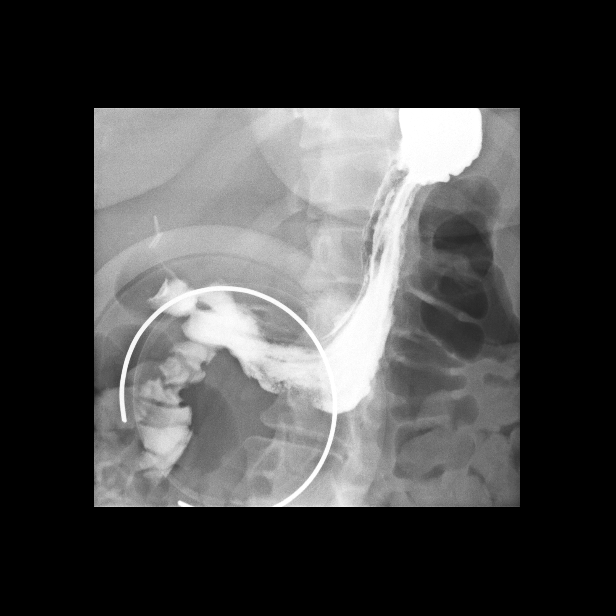

[Series 13: sequence · 1 of 118 frames shown (5 of 12)]
[frame 60/118]
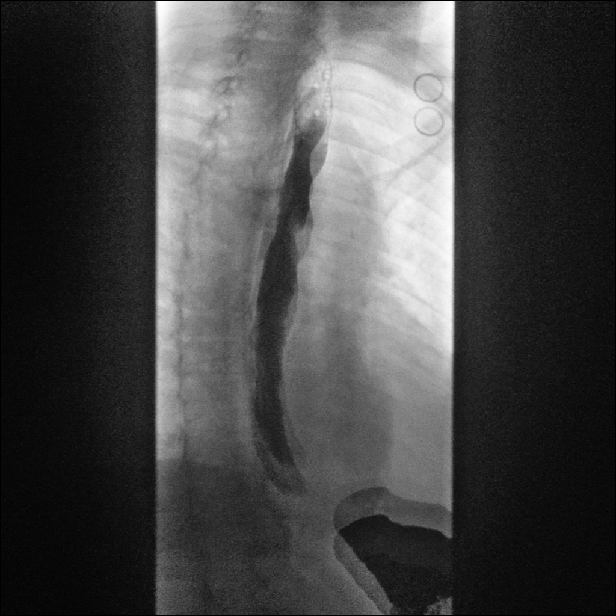

[Series 14: sequence · 1 of 68 frames shown (6 of 12)]
[frame 35/68]
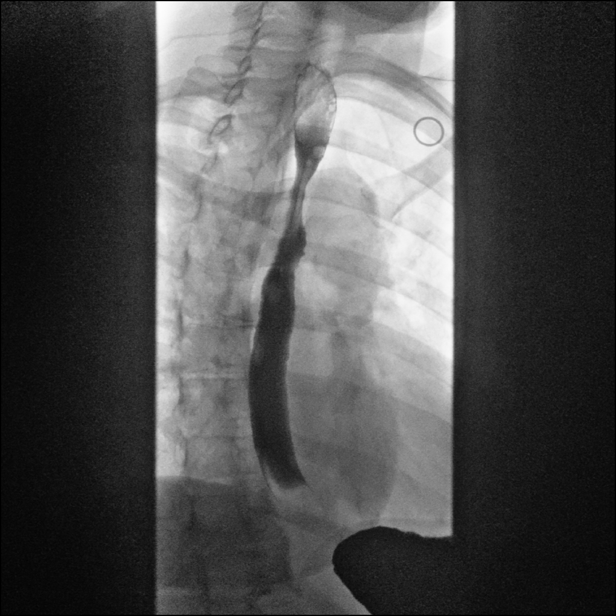

[Series 15: sequence · 1 of 76 frames shown (7 of 12)]
[frame 12/76]
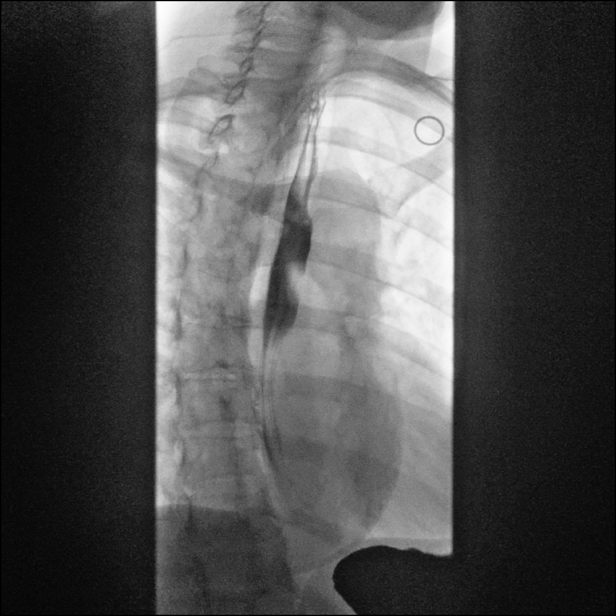

[Series 16: sequence · 1 of 36 frames shown (8 of 12)]
[frame 27/36]
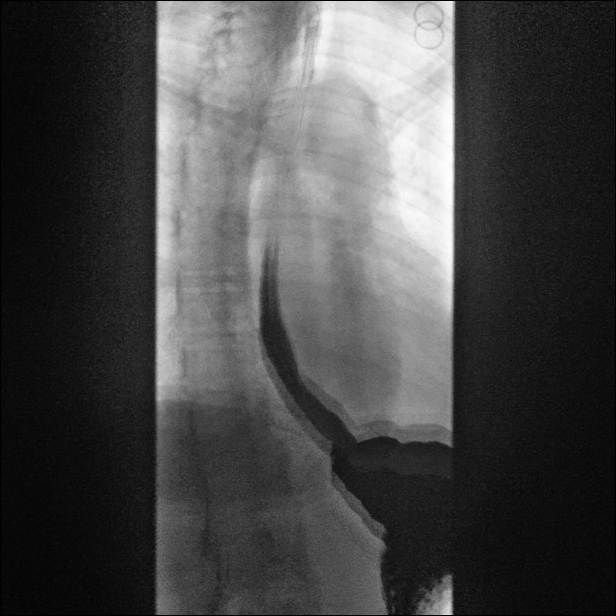

[Series 17: sequence · 1 of 34 frames shown (9 of 12)]
[frame 29/34]
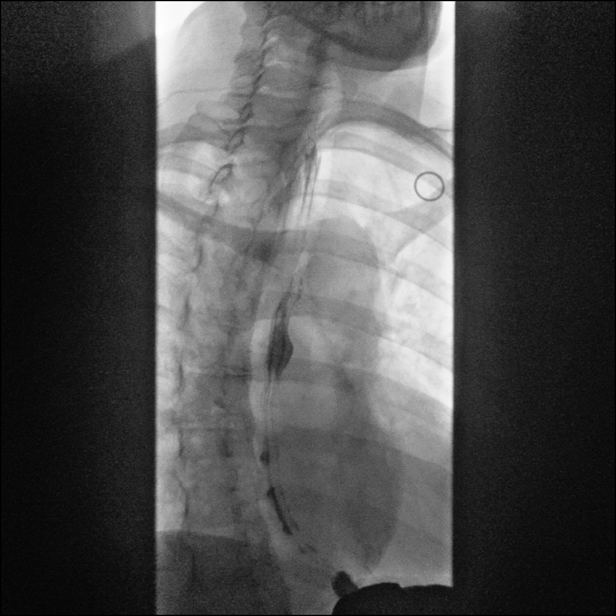

[Series 19: sequence · 1 of 62 frames shown (10 of 12)]
[frame 9/62]
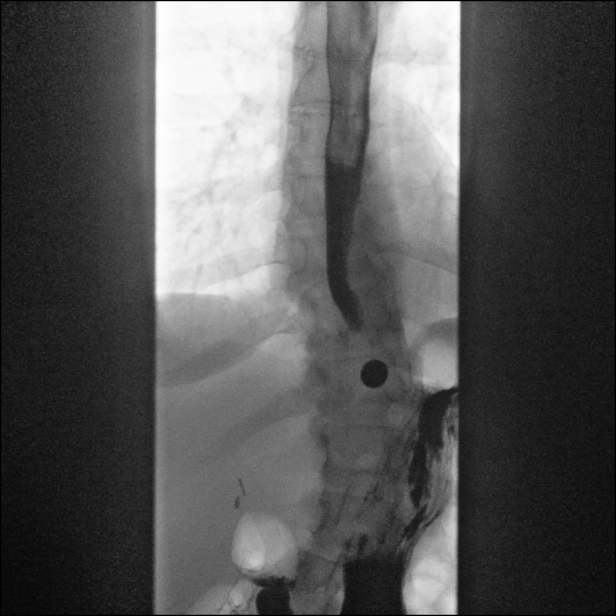

[Series 20: sequence · 1 of 76 frames shown (11 of 12)]
[frame 39/76]
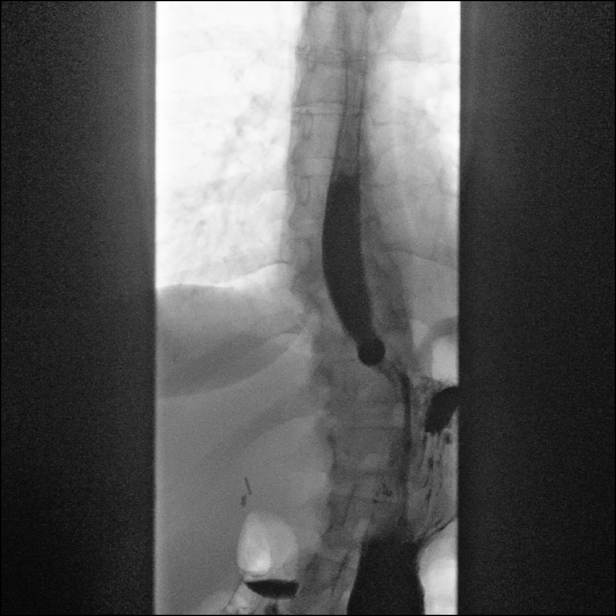

[Series 21: sequence · 1 of 31 frames shown (12 of 12)]
[frame 27/31]
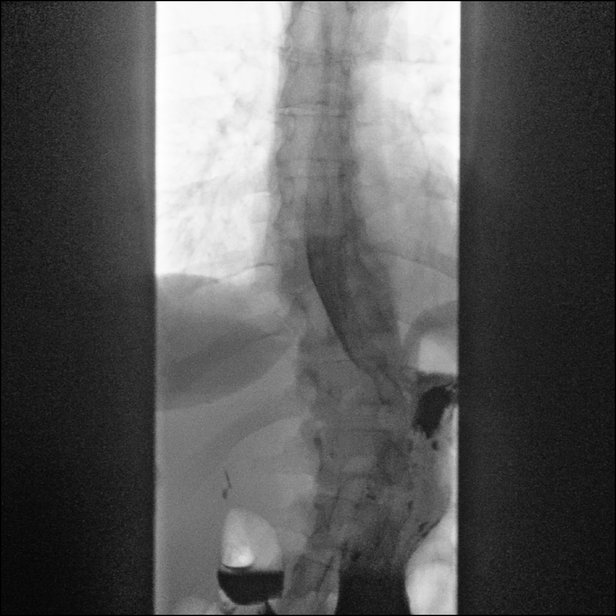

[13 of 24 positions shown; findings below may reference images not displayed]

FINDINGS: Frontal and lateral views of the hypopharynx while swallowing are
remarkable for laryngeal penetration to the level of the vocal cords
on 1 of the initial swallows. No frank aspiration. No episodes of
aspiration were observed during the remainder of the exam.

Due to limitations related to the patient's clinical status/limited
mobility, single contrast study was performed. Despite
single-contrast study, upright assessment of the esophagus generates
a double-contrast appearance due to spontaneous swallowing of air.
There is no evidence for esophageal diverticulum, esophageal mass
lesion, or gross mucosal ulceration. Mild narrowing of the distal
esophagus evident. A 13 mm barium tablet lodges at this area of
smooth mild narrowing in the distal esophagus. Patient was given 5
alternating swallows of thin barium and water and the tablet did
ultimately pass into the stomach on the fifth attempt while drinking
water.

Assessment of esophageal motility shows preservation of the primary
peristaltic stripping wave on [DATE] swallows with some minimal
proximal escape. Mild tertiary contractions noted in the distal
third of the esophagus. No evidence for presbyesophagus.

No evidence for hiatal hernia. Single contrast imaging of the
stomach with spot compression shows no gastric mass lesion or gross
mucosal ulceration. Gastric emptying is prompt. Pylorus and duodenal
bulb are normal. By 15 minutes after administration of the first
swallow of barium, no migration of barium beyond the ligament of
Treitz is observed.
IMPRESSION: 1. Gradual, smooth, mild narrowing of the distal esophagus just
proximal to the esophagogastric junction. A 13 mm barium tablet
becomes lodged at this location through multiple repeat swallows of
thin barium and water before passing into the stomach.
2. Good preservation of primary peristaltic stripping wave with some
tertiary contractions noted in the distal third of the esophagus.
3. Despite prompt gastric emptying, sluggish peristalsis noted in
the duodenum. By 15 minutes after starting the procedure, no barium
had migrated into the jejunum.
4. Stomach is normally positioned without evidence of hiatal hernia.
No gross ulceration or mass lesion on single contrast imaging.
5. Laryngeal penetration noted on initial swallows of thick barium
with penetration to the level of the vocal cords. No frank
aspiration below the level of the cords. No subsequent episodes of
aspiration were observed.

## 2021-08-04 ENCOUNTER — Ambulatory Visit: Payer: BC Managed Care – PPO | Admitting: Internal Medicine

## 2021-08-04 ENCOUNTER — Encounter: Payer: Self-pay | Admitting: Internal Medicine

## 2021-08-04 DIAGNOSIS — E538 Deficiency of other specified B group vitamins: Secondary | ICD-10-CM | POA: Diagnosis not present

## 2021-08-04 DIAGNOSIS — G2582 Stiff-man syndrome: Secondary | ICD-10-CM | POA: Diagnosis not present

## 2021-08-04 DIAGNOSIS — E063 Autoimmune thyroiditis: Secondary | ICD-10-CM

## 2021-08-04 DIAGNOSIS — E038 Other specified hypothyroidism: Secondary | ICD-10-CM | POA: Diagnosis not present

## 2021-08-04 MED ORDER — BUTALBITAL-APAP-CAFFEINE 50-325-40 MG PO TABS: 1.0000 | ORAL_TABLET | Freq: Four times a day (QID) | ORAL | 4 refills | Status: DC | PRN

## 2021-08-04 MED ORDER — DIAZEPAM 10 MG PO TABS: ORAL_TABLET | ORAL | 4 refills | Status: DC

## 2021-08-04 MED ORDER — LIOTHYRONINE SODIUM 25 MCG PO TABS
12.5000 ug | ORAL_TABLET | Freq: Every day | ORAL | 3 refills | Status: DC
Start: 2021-08-04 — End: 2021-12-01

## 2021-08-04 MED ORDER — BACLOFEN 10 MG PO TABS: 10.0000 mg | ORAL_TABLET | Freq: Two times a day (BID) | ORAL | 3 refills | Status: DC

## 2021-08-04 MED ORDER — SYNTHROID 137 MCG PO TABS: 137.0000 ug | ORAL_TABLET | Freq: Every morning | ORAL | 3 refills | Status: DC

## 2021-08-04 MED ORDER — ONDANSETRON HCL 8 MG PO TABS: ORAL_TABLET | ORAL | 3 refills | Status: DC

## 2021-08-04 NOTE — Assessment & Plan Note (Signed)
  F/u w/Dr [facility]

## 2021-08-04 NOTE — Assessment & Plan Note (Signed)
Cont on Levothroid, Cytomel combo helps the pt the best

## 2021-08-04 NOTE — Progress Notes (Signed)
Subjective:  Patient ID: Lisa Bruce, female    DOB: 01-Jul-1965  Age: 56 y.o. MRN: [id number]  CC: No chief complaint on file.   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     benzoyl peroxide 10 % gel Apply topically at bedtime.     bisacodyl (DULCOLAX) 5 MG EC tablet Take by mouth.     Cholecalciferol (VITAMIN D3) 125 MCG (5000 UT) TABS Take 1 tablet by mouth.     ciclopirox (PENLAC) 8 % solution Apply topically at bedtime. Apply over nail and surrounding skin. Apply daily over previous coat. After seven (7) days, may remove with alcohol and continue cycle. 6.6 mL 2   ibuprofen (ADVIL,MOTRIN) 200 MG tablet Take by mouth.     linaclotide (LINZESS) 145 MCG CAPS capsule Take 1 capsule (145 mcg total) by mouth 1 day or 1 dose. 90 capsule 3   morphine (MS CONTIN) 30 MG 12 hr tablet Take 1 tablet by mouth every 8 (eight) hours as needed.  0   baclofen (LIORESAL) 10 MG tablet Take 1 tablet (10 mg total) by mouth 2 (two) times daily. 180 tablet 3   butalbital-acetaminophen-caffeine (FIORICET) 50-325-40 MG tablet Take 1 tablet by mouth every 6 (six) hours as needed for headache. 60 tablet 4   diazepam (VALIUM) 10 MG tablet TAKE 3 TABLETS AT 6AM, 3 TABLETS AT NOON, 3 TABLETS AT 5PM, 4 TABLETS AT BEDTIME 390 tablet 4   liothyronine (CYTOMEL) 25 MCG tablet Take 0.5 tablets (12.5 mcg total) by mouth daily. 45 tablet 3   ondansetron (ZOFRAN) 8 MG tablet TAKE 0.5-1 TABLETS (4-8 MG TOTAL) BY MOUTH EVERY 8 (EIGHT) HOURS AS NEEDED FOR NAUSEA OR VOMITING. 20 tablet 3   SYNTHROID 137 MCG tablet Take 1 tablet (137 mcg total) by mouth every morning. 90 tablet 3   No facility-administered medications prior to visit.    ROS: Review of Systems  Constitutional:  Positive for fatigue. Negative for activity change,  appetite change, chills and unexpected weight change.  HENT:  Negative for congestion, mouth sores and sinus pressure.   Eyes:  Negative for visual disturbance.  Respiratory:  Negative for cough and chest tightness.   Gastrointestinal:  Negative for abdominal pain and nausea.  Genitourinary:  Negative for difficulty urinating, frequency and vaginal pain.  Musculoskeletal:  Positive for arthralgias, back pain, gait problem, myalgias, neck pain and neck stiffness.  Skin:  Positive for color change. Negative for pallor and rash.  Neurological:  Positive for tremors and weakness. Negative for dizziness, numbness and headaches.  Psychiatric/Behavioral:  Positive for sleep disturbance. Negative for confusion and suicidal ideas. The patient is nervous/anxious.     Objective:  BP 118/80 (BP Location: Left Arm, Patient Position: Sitting, Cuff Size: Normal)   Temp 98.1 F (36.7 C) (Oral)   Ht '5\' 6"'$  (1.676 m)   Wt 160 lb (72.6 kg)   BMI 25.82 kg/m   BP Readings from Last 3 Encounters:  08/04/21 118/80  05/05/21 124/70  01/20/21 118/79    Wt Readings from Last 3 Encounters:  08/04/21 160 lb (72.6 kg)  05/05/21 150 lb (68 kg)  01/20/21 147 lb 6.4 oz (66.9 kg)    Physical Exam Constitutional:      General: She is not in acute distress.    Appearance: Normal appearance. She is well-developed.  HENT:     Head: Normocephalic.     Right Ear: External ear normal.     Left Ear: External ear normal.     Nose: Nose normal.  Eyes:     General:        Right eye: No discharge.        Left eye: No discharge.     Conjunctiva/sclera: Conjunctivae normal.     Pupils: Pupils are equal, round, and reactive to light.  Neck:     Thyroid: No thyromegaly.     Vascular: No JVD.     Trachea: No tracheal deviation.  Cardiovascular:     Rate and Rhythm: Normal rate and regular rhythm.     Heart sounds: Normal heart sounds.  Pulmonary:     Effort: No respiratory distress.     Breath sounds: No  stridor. No wheezing.  Abdominal:     General: Bowel sounds are normal. There is no distension.     Palpations: Abdomen is soft. There is no mass.     Tenderness: There is no abdominal tenderness. There is no guarding or rebound.  Musculoskeletal:        General: Tenderness present.     Cervical back: Normal range of motion and neck supple. No rigidity.  Lymphadenopathy:     Cervical: No cervical adenopathy.  Skin:    Findings: No erythema or rash.  Neurological:     Mental Status: She is oriented to person, place, and time.     Cranial Nerves: No cranial nerve deficit.     Motor: No abnormal muscle tone.     Coordination: Coordination abnormal.     Gait: Gait abnormal.     Deep Tendon Reflexes: Reflexes normal.  Psychiatric:        Behavior: Behavior normal.        Thought Content: Thought content normal.        Judgment: Judgment normal.   Spastic muscles, tremor  Lab Results  Component Value Date   WBC 3.4 (L) 05/05/2021   HGB 12.2 05/05/2021   HCT 35.4 (L) 05/05/2021   PLT 157.0 05/05/2021   GLUCOSE 75 05/05/2021   CHOL 228 (H) 03/12/2020   TRIG 140.0 03/12/2020   HDL 69.20 03/12/2020   LDLDIRECT 180.1 05/22/2010   LDLCALC 131 (H) 03/12/2020   ALT 19 05/05/2021   AST 26 05/05/2021   NA 135 05/05/2021   K 3.7 05/05/2021   CL 103 05/05/2021   CREATININE 0.84 05/05/2021   BUN 11 05/05/2021   CO2 25 05/05/2021   TSH 0.01 (L) 05/05/2021   HGBA1C 5.2 05/05/2021    DG UGI W DOUBLE CM (HD BA)  Result Date: 09/07/2018 CLINICAL DATA:  Pharyngeal esophageal dysphagia.  Loss of appetite. EXAM: UPPER GI SERIES WITHOUT KUB TECHNIQUE: Routine upper GI series was performed with thin/high density/water soluble barium. FLUOROSCOPY TIME:  Fluoroscopy Time:  5 minutes and 48 seconds. Radiation Exposure Index (if provided by the fluoroscopic device): 273 mGy Number of Acquired Spot Images: COMPARISON:  Two-view abdomen 09/06/2018 FINDINGS: Frontal and lateral views of the  hypopharynx while swallowing are remarkable for laryngeal penetration to the level of the vocal cords on 1 of the initial swallows. No frank aspiration. No episodes of aspiration were observed during the remainder of the exam. Due to limitations related to the patient's clinical status/limited mobility, single contrast study was performed. Despite single-contrast study, upright assessment of the esophagus generates a double-contrast appearance due to spontaneous swallowing of air. There  is no evidence for esophageal diverticulum, esophageal mass lesion, or gross mucosal ulceration. Mild narrowing of the distal esophagus evident. A 13 mm barium tablet lodges at this area of smooth mild narrowing in the distal esophagus. Patient was given 5 alternating swallows of thin barium and water and the tablet did ultimately pass into the stomach on the fifth attempt while drinking water. Assessment of esophageal motility shows preservation of the primary peristaltic stripping wave on 4/5 swallows with some minimal proximal escape. Mild tertiary contractions noted in the distal third of the esophagus. No evidence for presbyesophagus. No evidence for hiatal hernia. Single contrast imaging of the stomach with spot compression shows no gastric mass lesion or gross mucosal ulceration. Gastric emptying is prompt. Pylorus and duodenal bulb are normal. By 15 minutes after administration of the first swallow of barium, no migration of barium beyond the ligament of Treitz is observed. IMPRESSION: 1. Gradual, smooth, mild narrowing of the distal esophagus just proximal to the esophagogastric junction. A 13 mm barium tablet becomes lodged at this location through multiple repeat swallows of thin barium and water before passing into the stomach. 2. Good preservation of primary peristaltic stripping wave with some tertiary contractions noted in the distal third of the esophagus. 3. Despite prompt gastric emptying, sluggish peristalsis noted  in the duodenum. By 15 minutes after starting the procedure, no barium had migrated into the jejunum. 4. Stomach is normally positioned without evidence of hiatal hernia. No gross ulceration or mass lesion on single contrast imaging. 5. Laryngeal penetration noted on initial swallows of thick barium with penetration to the level of the vocal cords. No frank aspiration below the level of the cords. No subsequent episodes of aspiration were observed. Electronically Signed   By: Misty Stanley M.D.   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    Dispense:  180 tablet    Refill:  3   butalbital-acetaminophen-caffeine (FIORICET) 50-325-40 MG tablet    Sig: Take 1 tablet by mouth every 6 (six) hours as needed for headache.    Dispense:  60 tablet    Refill:  4   diazepam (VALIUM) 10 MG tablet    Sig: TAKE 3 TABLETS AT 6AM, 3 TABLETS AT NOON, 3 TABLETS AT 5PM, 4 TABLETS AT BEDTIME    Dispense:  390 tablet    Refill:  4   ondansetron (ZOFRAN) 8 MG tablet    Sig: TAKE 0.5-1 TABLETS (4-8 MG TOTAL) BY MOUTH EVERY 8 (EIGHT) HOURS AS NEEDED FOR NAUSEA OR VOMITING.    Dispense:  20 tablet    Refill:  3   liothyronine (CYTOMEL)  25 MCG tablet    Sig: Take 0.5 tablets (12.5 mcg total) by mouth daily.    Dispense:  45 tablet     Refill:  3   SYNTHROID 137 MCG tablet    Sig: Take 1 tablet (137 mcg total) by mouth every morning.    Dispense:  90 tablet    Refill:  3      Follow-up: Return for a follow-up visit.  Walker Kehr, MD

## 2021-08-04 NOTE — Assessment & Plan Note (Signed)
On B12 

## 2021-09-08 DIAGNOSIS — Z79891 Long term (current) use of opiate analgesic: Secondary | ICD-10-CM | POA: Diagnosis not present

## 2021-09-08 DIAGNOSIS — M791 Myalgia, unspecified site: Secondary | ICD-10-CM | POA: Diagnosis not present

## 2021-09-08 DIAGNOSIS — G894 Chronic pain syndrome: Secondary | ICD-10-CM | POA: Diagnosis not present

## 2021-09-08 DIAGNOSIS — G259 Extrapyramidal and movement disorder, unspecified: Secondary | ICD-10-CM | POA: Diagnosis not present

## 2021-10-28 ENCOUNTER — Telehealth: Payer: Self-pay

## 2021-10-28 NOTE — Telephone Encounter (Signed)
Pt is requesting that her 4  mo fu be VV.  I advised her that I would give a call back to change to Virtual upon approval.  Please advise

## 2021-10-30 NOTE — Telephone Encounter (Signed)
Called pt no answer LMOM OK for virtual.. has been changed.Marland KitchenJohny Bruce

## 2021-10-30 NOTE — Telephone Encounter (Signed)
Okay.  Thanks.

## 2021-11-03 DIAGNOSIS — G259 Extrapyramidal and movement disorder, unspecified: Secondary | ICD-10-CM | POA: Diagnosis not present

## 2021-11-03 DIAGNOSIS — M791 Myalgia, unspecified site: Secondary | ICD-10-CM | POA: Diagnosis not present

## 2021-11-03 DIAGNOSIS — Z79891 Long term (current) use of opiate analgesic: Secondary | ICD-10-CM | POA: Diagnosis not present

## 2021-11-03 DIAGNOSIS — G894 Chronic pain syndrome: Secondary | ICD-10-CM | POA: Diagnosis not present

## 2021-12-01 ENCOUNTER — Telehealth (INDEPENDENT_AMBULATORY_CARE_PROVIDER_SITE_OTHER): Payer: BC Managed Care – PPO | Admitting: Internal Medicine

## 2021-12-01 ENCOUNTER — Encounter: Payer: Self-pay | Admitting: Internal Medicine

## 2021-12-01 DIAGNOSIS — E538 Deficiency of other specified B group vitamins: Secondary | ICD-10-CM

## 2021-12-01 DIAGNOSIS — E038 Other specified hypothyroidism: Secondary | ICD-10-CM | POA: Diagnosis not present

## 2021-12-01 DIAGNOSIS — D508 Other iron deficiency anemias: Secondary | ICD-10-CM | POA: Diagnosis not present

## 2021-12-01 DIAGNOSIS — E559 Vitamin D deficiency, unspecified: Secondary | ICD-10-CM

## 2021-12-01 DIAGNOSIS — R739 Hyperglycemia, unspecified: Secondary | ICD-10-CM

## 2021-12-01 DIAGNOSIS — R110 Nausea: Secondary | ICD-10-CM

## 2021-12-01 DIAGNOSIS — G44039 Episodic paroxysmal hemicrania, not intractable: Secondary | ICD-10-CM

## 2021-12-01 DIAGNOSIS — G2582 Stiff-man syndrome: Secondary | ICD-10-CM

## 2021-12-01 MED ORDER — DIAZEPAM 10 MG PO TABS: ORAL_TABLET | ORAL | 4 refills | Status: DC

## 2021-12-01 MED ORDER — BUTALBITAL-APAP-CAFFEINE 50-325-40 MG PO TABS: 1.0000 | ORAL_TABLET | Freq: Four times a day (QID) | ORAL | 4 refills | Status: DC | PRN

## 2021-12-01 MED ORDER — BACLOFEN 10 MG PO TABS: 10.0000 mg | ORAL_TABLET | Freq: Two times a day (BID) | ORAL | 3 refills | Status: DC

## 2021-12-01 MED ORDER — ONDANSETRON HCL 8 MG PO TABS: ORAL_TABLET | ORAL | 3 refills | Status: DC

## 2021-12-01 MED ORDER — SYNTHROID 137 MCG PO TABS: 137.0000 ug | ORAL_TABLET | Freq: Every morning | ORAL | 3 refills | Status: DC

## 2021-12-01 MED ORDER — LIOTHYRONINE SODIUM 25 MCG PO TABS: 12.5000 ug | ORAL_TABLET | Freq: Every day | ORAL | 3 refills | Status: DC

## 2021-12-01 NOTE — Assessment & Plan Note (Signed)
Check B12 

## 2021-12-01 NOTE — Assessment & Plan Note (Signed)
Monitor A1c 

## 2021-12-01 NOTE — Assessment & Plan Note (Signed)
Check Vit D 

## 2021-12-01 NOTE — Assessment & Plan Note (Signed)
[name] had IV iron CBC, iron tests

## 2021-12-01 NOTE — Assessment & Plan Note (Signed)
Chronic  Cont on Levothroid, Cytomel combo helps the pt the best Chectk FT4, FT3 and TSH

## 2021-12-01 NOTE — Progress Notes (Signed)
Virtual Visit via Telephone Note  I connected with Lisa Bruce on 03/12/20 at  9:30 AM EST by telephone and verified that I am speaking with the correct person using two identifiers.  Location:  Persons participating in the virtual visit: patient, provider Patient: home Provider: [place] [facility]   I discussed the limitations, risks, security and privacy concerns of performing an evaluation and management service by telephone and the availability of in person appointments. I also discussed with the patient that there may be a patient responsible charge related to this service. The patient expressed understanding and agreed to proceed.  No chief complaint on file.    History of Present Illness: F/u on chronic spasms, pain, anxiety, nausea, HAs   Review of Systems  Constitutional:  Positive for malaise/fatigue.  Eyes:  Negative for blurred vision and double vision.  Respiratory:  Negative for shortness of breath and wheezing.   Cardiovascular:  Positive for palpitations. Negative for leg swelling.  Gastrointestinal:  Positive for nausea.  Musculoskeletal:  Positive for back pain, joint pain, myalgias and neck pain.  Neurological:  Positive for weakness and headaches.  Psychiatric/Behavioral:  Negative for suicidal ideas. The patient is nervous/anxious and has insomnia.      Observations/Objective: Pt sounds normal on the phone  Assessment and Plan:  Problem List Items Addressed This Visit     Anemia, iron deficiency    [name] had IV iron CBC, iron tests      Relevant Orders   Comprehensive metabolic panel   CBC with Differential/Platelet   Iron, TIBC and Ferritin Panel   Chronic nausea    Recurrent nausea syndrome as needed Using Zofran prn Use apples, ginger for nausea      Headache    Chronic HAs relieved by Fioricet only x many years Cont on Fioricet  Potential benefits of a long term Fioricet use as well as potential risks  and complications were explained to the  patient and were aknowledged. On Diazepam, Baclofen as well  Potential benefits of a long term opioids and benzodiazepine and fioricet use as well as potential risks (i.e. addiction risk, apnea etc) and complications (i.e. Somnolence, constipation and others) were explained to the patient and were aknowledged.       Relevant Medications   butalbital-acetaminophen-caffeine (FIORICET) 50-325-40 MG tablet   baclofen (LIORESAL) 10 MG tablet   Hyperglycemia    Monitor A1c      Relevant Orders   Hemoglobin A1c   Hypothyroidism    Chronic  Cont on Levothroid, Cytomel combo helps the pt the best Chectk FT4, FT3 and TSH      Relevant Medications   liothyronine (CYTOMEL) 25 MCG tablet   SYNTHROID 137 MCG tablet   Other Relevant Orders   Comprehensive metabolic panel   CBC with Differential/Platelet   Iron, TIBC and Ferritin Panel   Hemoglobin A1c   Lipid panel   Magnesium   T3, free   T4, free   TSH   Urinalysis   Vitamin B12   VITAMIN D 25 Hydroxy (Vit-D Deficiency, Fractures)   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 addiction risk, apnea etc) and complications (i.e. Somnolence, constipation and others) were explained to the patient and were aknowledged.       Relevant Medications   baclofen (LIORESAL) 10 MG  tablet   diazepam (VALIUM) 10 MG tablet   Vitamin B12 deficiency    Check B12      Relevant Orders   Vitamin B12   Vitamin D deficiency    Check Vit D      Relevant Orders   VITAMIN D 25 Hydroxy (Vit-D Deficiency, Fractures)     Meds ordered this encounter  Medications   butalbital-acetaminophen-caffeine (FIORICET) 50-325-40 MG tablet    Sig: Take 1 tablet by mouth every 6 (six) hours as needed for headache.    Dispense:  60 tablet    Refill:  4   baclofen (LIORESAL) 10 MG  tablet    Sig: Take 1 tablet (10 mg total) by mouth 2 (two) times daily.    Dispense:  180 tablet    Refill:  3   diazepam (VALIUM) 10 MG tablet    Sig: TAKE 3 TABLETS AT 6AM, 3 TABLETS AT NOON, 3 TABLETS AT 5PM, 4 TABLETS AT BEDTIME    Dispense:  390 tablet    Refill:  4   liothyronine (CYTOMEL) 25 MCG tablet    Sig: Take 0.5 tablets (12.5 mcg total) by mouth daily.    Dispense:  45 tablet    Refill:  3   ondansetron (ZOFRAN) 8 MG tablet    Sig: TAKE 0.5-1 TABLETS (4-8 MG TOTAL) BY MOUTH EVERY 8 (EIGHT) HOURS AS NEEDED FOR NAUSEA OR VOMITING.    Dispense:  20 tablet    Refill:  3   SYNTHROID 137 MCG tablet    Sig: Take 1 tablet (137 mcg total) by mouth every morning.    Dispense:  90 tablet    Refill:  3      Follow Up Instructions:    I discussed the assessment and treatment plan with the patient. The patient was provided an opportunity to ask questions and all were answered. The patient agreed with the plan and demonstrated an understanding of the instructions.   The patient was advised to call back or seek an in-person evaluation if the symptoms worsen or if the condition fails to improve as anticipated.  I provided 25 minutes of non-face-to-face time during this encounter.   Walker Kehr, MD

## 2021-12-01 NOTE — Assessment & Plan Note (Signed)
Chronic HAs relieved by Fioricet only x many years Cont on Fioricet  Potential benefits of a long term Fioricet use as well as potential risks  and complications were explained to the patient and were aknowledged. On Diazepam, Baclofen as well  Potential benefits of a long term opioids and benzodiazepine and fioricet use as well as potential risks (i.e. addiction risk, apnea etc) and complications (i.e. Somnolence, constipation and others) were explained to the patient and were aknowledged.

## 2021-12-01 NOTE — Assessment & Plan Note (Addendum)
On MS conti - Dr Sharen Counter saw Dr Baxter Flattery ([facility] at [facility]) On Diazepam, Baclofen  Potential benefits of a long term opioids and benzodiazepine use as well as potential risks (i.e. addiction risk, apnea etc) and complications (i.e. Somnolence, constipation and others) were explained to the patient and were aknowledged.

## 2021-12-01 NOTE — Assessment & Plan Note (Signed)
Recurrent nausea syndrome as needed Using Zofran prn Use apples, ginger for nausea

## 2021-12-22 ENCOUNTER — Other Ambulatory Visit (INDEPENDENT_AMBULATORY_CARE_PROVIDER_SITE_OTHER): Payer: BC Managed Care – PPO

## 2021-12-22 DIAGNOSIS — R739 Hyperglycemia, unspecified: Secondary | ICD-10-CM

## 2021-12-22 DIAGNOSIS — E559 Vitamin D deficiency, unspecified: Secondary | ICD-10-CM

## 2021-12-22 DIAGNOSIS — D508 Other iron deficiency anemias: Secondary | ICD-10-CM | POA: Diagnosis not present

## 2021-12-22 DIAGNOSIS — E538 Deficiency of other specified B group vitamins: Secondary | ICD-10-CM | POA: Diagnosis not present

## 2021-12-22 DIAGNOSIS — G259 Extrapyramidal and movement disorder, unspecified: Secondary | ICD-10-CM | POA: Diagnosis not present

## 2021-12-22 DIAGNOSIS — M791 Myalgia, unspecified site: Secondary | ICD-10-CM | POA: Diagnosis not present

## 2021-12-22 DIAGNOSIS — G894 Chronic pain syndrome: Secondary | ICD-10-CM | POA: Diagnosis not present

## 2021-12-22 DIAGNOSIS — E038 Other specified hypothyroidism: Secondary | ICD-10-CM | POA: Diagnosis not present

## 2021-12-22 DIAGNOSIS — Z79891 Long term (current) use of opiate analgesic: Secondary | ICD-10-CM | POA: Diagnosis not present

## 2021-12-22 LAB — COMPREHENSIVE METABOLIC PANEL
ALT: 16 U/L (ref 0–35)
AST: 21 U/L (ref 0–37)
Albumin: 4.3 g/dL (ref 3.5–5.2)
Alkaline Phosphatase: 114 U/L (ref 39–117)
BUN: 13 mg/dL (ref 6–23)
CO2: 26 mEq/L (ref 19–32)
Calcium: 9.4 mg/dL (ref 8.4–10.5)
Chloride: 102 mEq/L (ref 96–112)
Creatinine, Ser: 0.85 mg/dL (ref 0.40–1.20)
GFR: 76.95 mL/min (ref >60.00)
Glucose, Bld: 109 mg/dL — ABNORMAL HIGH (ref 70–99)
Potassium: 3.4 mEq/L — ABNORMAL LOW (ref 3.5–5.1)
Sodium: 137 mEq/L (ref 135–145)
Total Bilirubin: 0.3 mg/dL (ref 0.2–1.2)
Total Protein: 7.9 g/dL (ref 6.0–8.3)

## 2021-12-22 LAB — LIPID PANEL
Cholesterol: 246 mg/dL — ABNORMAL HIGH (ref 0–200)
HDL: 68.6 mg/dL (ref >39.00)
LDL Cholesterol: 153 mg/dL — ABNORMAL HIGH (ref 0–99)
NonHDL: 177.86
Total CHOL/HDL Ratio: 4
Triglycerides: 125 mg/dL (ref 0.0–149.0)
VLDL: 25 mg/dL (ref 0.0–40.0)

## 2021-12-22 LAB — CBC WITH DIFFERENTIAL/PLATELET
Basophils Absolute: 0 10*3/uL (ref 0.0–0.1)
Basophils Relative: 0.9 % (ref 0.0–3.0)
Eosinophils Absolute: 0.1 10*3/uL (ref 0.0–0.7)
Eosinophils Relative: 1 % (ref 0.0–5.0)
HCT: 40 % (ref 36.0–46.0)
Hemoglobin: 13.3 g/dL (ref 12.0–15.0)
Lymphocytes Relative: 52.7 % — ABNORMAL HIGH (ref 12.0–46.0)
Lymphs Abs: 2.8 10*3/uL (ref 0.7–4.0)
MCHC: 33.3 g/dL (ref 30.0–36.0)
MCV: 92 fl (ref 78.0–100.0)
Monocytes Absolute: 0.3 10*3/uL (ref 0.1–1.0)
Monocytes Relative: 5.9 % (ref 3.0–12.0)
Neutro Abs: 2.1 10*3/uL (ref 1.4–7.7)
Neutrophils Relative %: 39.5 % — ABNORMAL LOW (ref 43.0–77.0)
Platelets: 211 10*3/uL (ref 150.0–400.0)
RBC: 4.35 Mil/uL (ref 3.87–5.11)
RDW: 13.5 % (ref 11.5–15.5)
WBC: 5.3 10*3/uL (ref 4.0–10.5)

## 2021-12-22 LAB — T3, FREE: T3, Free: 4.1 pg/mL (ref 2.3–4.2)

## 2021-12-22 LAB — MAGNESIUM: Magnesium: 1.8 mg/dL (ref 1.5–2.5)

## 2021-12-22 LAB — VITAMIN D 25 HYDROXY (VIT D DEFICIENCY, FRACTURES): VITD: 55.82 ng/mL (ref 30.00–100.00)

## 2021-12-22 LAB — TSH: TSH: 0.01 u[IU]/mL — ABNORMAL LOW (ref 0.35–5.50)

## 2021-12-22 LAB — HEMOGLOBIN A1C: Hgb A1c MFr Bld: 5.3 % (ref 4.6–6.5)

## 2021-12-22 LAB — VITAMIN B12: Vitamin B-12: 213 pg/mL (ref 211–911)

## 2021-12-22 LAB — T4, FREE: Free T4: 1.11 ng/dL (ref 0.60–1.60)

## 2021-12-23 LAB — IRON,TIBC AND FERRITIN PANEL
%SAT: 57 % (calc) — ABNORMAL HIGH (ref 16–45)
Ferritin: 21 ng/mL (ref 16–232)
Iron: 177 ug/dL — ABNORMAL HIGH (ref 45–160)
TIBC: 310 mcg/dL (calc) (ref 250–450)

## 2021-12-25 ENCOUNTER — Other Ambulatory Visit (INDEPENDENT_AMBULATORY_CARE_PROVIDER_SITE_OTHER): Payer: BC Managed Care – PPO

## 2021-12-25 ENCOUNTER — Other Ambulatory Visit: Payer: Self-pay

## 2021-12-25 DIAGNOSIS — E038 Other specified hypothyroidism: Secondary | ICD-10-CM | POA: Diagnosis not present

## 2021-12-25 LAB — URINALYSIS
Bilirubin Urine: NEGATIVE
Ketones, ur: NEGATIVE
Leukocytes,Ua: NEGATIVE
Nitrite: NEGATIVE
Specific Gravity, Urine: 1.02 (ref 1.000–1.030)
Total Protein, Urine: NEGATIVE
Urine Glucose: NEGATIVE
Urobilinogen, UA: 0.2 (ref 0.0–1.0)
pH: 6 (ref 5.0–8.0)

## 2022-01-01 ENCOUNTER — Telehealth: Payer: Self-pay | Admitting: Internal Medicine

## 2022-01-01 NOTE — Telephone Encounter (Signed)
Pt called to discuss lab results from 12/22/21. Pt also requested a copy of her lab results be mailed to her.  Read the provider's note he sent her regarding her results. Pt says she does not have MyChart. Provider's note pasted below:  Dear Lisa Bruce, Your iron is a little elevated.  You can stop taking iron supplement if you are taking it.  Your thyroid test TSH is abnormal, however, more accurate thyroid test called FT3 and FT4 are normal.  Your lipids are slightly elevated.  Your blood count was normal.  Your glucose was slightly elevated at 109. Your vitamin D was normal.   Your vitamin B12 was low normal you need to double your vitamin B-12 intake.  Your diabetic test hemoglobin A1c was normal.  Your magnesium level was normal. Sincerely, AP  Pt asking what do we do next for her weakness, low energy and thinks it may be related to her disease. She says she does not take iron pills. What causes iron to be elevated?  Please call pt [phone].

## 2022-01-01 NOTE — Telephone Encounter (Signed)
Pt wants a phone call but not today as she is not available this afternoon. Also she says you can call her husband to give results and instructions if she is not available.

## 2022-01-01 NOTE — Telephone Encounter (Signed)
Labs dated 12/22/21 printed and have been mailed to pt home address today as patient requested.

## 2022-02-23 DIAGNOSIS — G259 Extrapyramidal and movement disorder, unspecified: Secondary | ICD-10-CM | POA: Diagnosis not present

## 2022-02-23 DIAGNOSIS — M791 Myalgia, unspecified site: Secondary | ICD-10-CM | POA: Diagnosis not present

## 2022-02-23 DIAGNOSIS — Z79891 Long term (current) use of opiate analgesic: Secondary | ICD-10-CM | POA: Diagnosis not present

## 2022-02-23 DIAGNOSIS — G894 Chronic pain syndrome: Secondary | ICD-10-CM | POA: Diagnosis not present

## 2022-03-05 ENCOUNTER — Telehealth: Payer: Self-pay | Admitting: Internal Medicine

## 2022-03-05 NOTE — Telephone Encounter (Signed)
Patient called about her appoijntment on 04/13/22 and she wants to know if it can be a video visit? Call back is [phone]

## 2022-03-06 NOTE — Telephone Encounter (Signed)
Notified pt w/ MD response. Change appt to virtual./lmb

## 2022-03-06 NOTE — Telephone Encounter (Signed)
Okay.  Thanks.

## 2022-03-24 DIAGNOSIS — Z1212 Encounter for screening for malignant neoplasm of rectum: Secondary | ICD-10-CM | POA: Diagnosis not present

## 2022-03-24 DIAGNOSIS — Z1211 Encounter for screening for malignant neoplasm of colon: Secondary | ICD-10-CM | POA: Diagnosis not present

## 2022-03-25 ENCOUNTER — Other Ambulatory Visit: Payer: Self-pay | Admitting: Internal Medicine

## 2022-04-06 ENCOUNTER — Ambulatory Visit: Payer: BC Managed Care – PPO | Admitting: Internal Medicine

## 2022-04-06 LAB — COLOGUARD: COLOGUARD: POSITIVE — AB

## 2022-04-09 ENCOUNTER — Telehealth: Payer: Self-pay | Admitting: Internal Medicine

## 2022-04-09 NOTE — Telephone Encounter (Signed)
She is correct. He will call her even though its says mychart.Marland KitchenJohny Bruce

## 2022-04-09 NOTE — Telephone Encounter (Signed)
Pt called stated she was told she can do a telephone call with Dr. Camila Li advised the pt that  we just do My Chart. pt complaining she was told we do telephone calls. she has a appt set up through My Chart tomorrow I told her how it work pt not having it and wants to talk over the phone   Please call pt with update

## 2022-04-13 ENCOUNTER — Encounter: Payer: Self-pay | Admitting: Internal Medicine

## 2022-04-13 ENCOUNTER — Telehealth (INDEPENDENT_AMBULATORY_CARE_PROVIDER_SITE_OTHER): Payer: BC Managed Care – PPO | Admitting: Internal Medicine

## 2022-04-13 DIAGNOSIS — R739 Hyperglycemia, unspecified: Secondary | ICD-10-CM | POA: Diagnosis not present

## 2022-04-13 DIAGNOSIS — E038 Other specified hypothyroidism: Secondary | ICD-10-CM

## 2022-04-13 DIAGNOSIS — R195 Other fecal abnormalities: Secondary | ICD-10-CM

## 2022-04-13 DIAGNOSIS — G2582 Stiff-man syndrome: Secondary | ICD-10-CM

## 2022-04-13 DIAGNOSIS — D508 Other iron deficiency anemias: Secondary | ICD-10-CM

## 2022-04-13 DIAGNOSIS — E538 Deficiency of other specified B group vitamins: Secondary | ICD-10-CM

## 2022-04-13 MED ORDER — BACLOFEN 10 MG PO TABS: 10.0000 mg | ORAL_TABLET | Freq: Two times a day (BID) | ORAL | 3 refills | Status: DC

## 2022-04-13 MED ORDER — SYNTHROID 137 MCG PO TABS: 137.0000 ug | ORAL_TABLET | Freq: Every morning | ORAL | 3 refills | Status: DC

## 2022-04-13 MED ORDER — DIAZEPAM 10 MG PO TABS: ORAL_TABLET | ORAL | 4 refills | Status: DC

## 2022-04-13 MED ORDER — BUTALBITAL-APAP-CAFFEINE 50-325-40 MG PO TABS: 1.0000 | ORAL_TABLET | Freq: Four times a day (QID) | ORAL | 4 refills | Status: DC | PRN

## 2022-04-13 MED ORDER — LIOTHYRONINE SODIUM 25 MCG PO TABS: 12.5000 ug | ORAL_TABLET | Freq: Every day | ORAL | 3 refills | Status: DC

## 2022-04-13 MED ORDER — ONDANSETRON HCL 8 MG PO TABS: ORAL_TABLET | ORAL | 3 refills | Status: DC

## 2022-04-13 NOTE — Assessment & Plan Note (Signed)
Check A1c. 

## 2022-04-13 NOTE — Assessment & Plan Note (Signed)
Check CBC 

## 2022-04-13 NOTE — Assessment & Plan Note (Signed)
Pt had a positive Cologuard - f/u [facility]

## 2022-04-13 NOTE — Progress Notes (Signed)
Virtual Visit via Video Note  I connected with Lisa Bruce on 04/13/22 at  2:20 PM EST by a video enabled telemedicine application and verified that I am speaking with the correct person using two identifiers.   I discussed the limitations of evaluation and management by telemedicine and the availability of in person appointments. The patient expressed understanding and agreed to proceed.  I was located at our [facility] office. The patient was at home. Darren was  present in the visit.  No chief complaint on file.    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  Respiratory:  Negative for cough.   Cardiovascular:  Negative for palpitations.  Gastrointestinal:  Negative for heartburn.  Genitourinary:  Negative for dysuria.  Musculoskeletal:  Negative for falls.  Neurological:  Positive for tremors and weakness. Negative for seizures.  Psychiatric/Behavioral:  Positive for depression. Negative for memory loss, substance abuse and suicidal ideas. The patient is nervous/anxious and has insomnia.      Observations/Objective: The patient appears to be in no acute distress. Anxious. 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 addiction risk, apnea etc) and complications (i.e. Somnolence, constipation and others) were explained to the patient and were aknowledged.      Relevant Medications   baclofen (LIORESAL) 10 MG  tablet   diazepam (VALIUM) 10 MG tablet     Other   Vitamin B12 deficiency    On B12      Positive colorectal cancer screening using Cologuard test     Pt had a positive Cologuard - f/u [facility]      Hyperglycemia    Check A1c      Anemia, iron deficiency    Check CBC        Meds ordered this encounter  Medications   baclofen (LIORESAL) 10 MG tablet    Sig: Take 1 tablet (10 mg total) by mouth 2 (two) times daily.    Dispense:  180 tablet    Refill:  3   butalbital-acetaminophen-caffeine (FIORICET) 50-325-40 MG tablet    Sig: Take 1 tablet by mouth every 6 (six) hours as needed for headache.    Dispense:  60 tablet    Refill:  4   diazepam (VALIUM) 10 MG tablet    Sig: TAKE 3 TABLETS AT 6AM, 3 TABLETS AT NOON, 3 TABLETS AT 5PM, 4 TABLETS AT BEDTIME    Dispense:  390 tablet    Refill:  4   liothyronine (CYTOMEL) 25 MCG tablet    Sig: Take 0.5 tablets (12.5 mcg total) by mouth daily.    Dispense:  45 tablet    Refill:  3   ondansetron (ZOFRAN) 8 MG tablet    Sig: TAKE 0.5-1 TABLETS (4-8 MG TOTAL) BY MOUTH EVERY 8 (EIGHT) HOURS AS NEEDED FOR NAUSEA OR  VOMITING.    Dispense:  20 tablet    Refill:  3   SYNTHROID 137 MCG tablet    Sig: Take 1 tablet (137 mcg total) by mouth every morning.    Dispense:  90 tablet    Refill:  3     Follow Up Instructions:    I discussed the assessment and treatment plan with the patient. The patient was provided an opportunity to ask questions and all were answered. The patient agreed with the plan and demonstrated an understanding of the instructions.   The patient was advised to call back or seek an in-person evaluation if the symptoms worsen or if the condition fails to improve as anticipated.  I provided face-to-face time during this encounter. We were at different locations.   Walker Kehr, MD

## 2022-04-13 NOTE — Assessment & Plan Note (Signed)
Check FT4, FT3 and TSH

## 2022-04-13 NOTE — Assessment & Plan Note (Signed)
On B12 

## 2022-04-13 NOTE — Assessment & Plan Note (Signed)
On Diazepam, Baclofen  Potential benefits of a long term opioids and benzodiazepine use as well as potential risks (i.e. addiction risk, apnea etc) and complications (i.e. Somnolence, constipation and others) were explained to the patient and were aknowledged.

## 2022-04-18 ENCOUNTER — Other Ambulatory Visit: Payer: Self-pay | Admitting: Internal Medicine

## 2022-04-27 DIAGNOSIS — G894 Chronic pain syndrome: Secondary | ICD-10-CM | POA: Diagnosis not present

## 2022-04-27 DIAGNOSIS — G259 Extrapyramidal and movement disorder, unspecified: Secondary | ICD-10-CM | POA: Diagnosis not present

## 2022-04-27 DIAGNOSIS — M791 Myalgia, unspecified site: Secondary | ICD-10-CM | POA: Diagnosis not present

## 2022-04-27 DIAGNOSIS — Z79891 Long term (current) use of opiate analgesic: Secondary | ICD-10-CM | POA: Diagnosis not present

## 2022-06-22 DIAGNOSIS — G259 Extrapyramidal and movement disorder, unspecified: Secondary | ICD-10-CM | POA: Diagnosis not present

## 2022-06-22 DIAGNOSIS — G894 Chronic pain syndrome: Secondary | ICD-10-CM | POA: Diagnosis not present

## 2022-06-22 DIAGNOSIS — M791 Myalgia, unspecified site: Secondary | ICD-10-CM | POA: Diagnosis not present

## 2022-06-22 DIAGNOSIS — Z79891 Long term (current) use of opiate analgesic: Secondary | ICD-10-CM | POA: Diagnosis not present

## 2022-07-27 ENCOUNTER — Encounter: Payer: Self-pay | Admitting: Internal Medicine

## 2022-07-27 ENCOUNTER — Other Ambulatory Visit (INDEPENDENT_AMBULATORY_CARE_PROVIDER_SITE_OTHER): Payer: BC Managed Care – PPO

## 2022-07-27 ENCOUNTER — Ambulatory Visit: Payer: BC Managed Care – PPO | Admitting: Internal Medicine

## 2022-07-27 VITALS — BP 110/76 | HR 62 | Temp 98.0°F | Ht 66.0 in | Wt 151.0 lb

## 2022-07-27 DIAGNOSIS — R195 Other fecal abnormalities: Secondary | ICD-10-CM

## 2022-07-27 DIAGNOSIS — D508 Other iron deficiency anemias: Secondary | ICD-10-CM

## 2022-07-27 DIAGNOSIS — E538 Deficiency of other specified B group vitamins: Secondary | ICD-10-CM

## 2022-07-27 DIAGNOSIS — G2582 Stiff-man syndrome: Secondary | ICD-10-CM

## 2022-07-27 DIAGNOSIS — E559 Vitamin D deficiency, unspecified: Secondary | ICD-10-CM

## 2022-07-27 DIAGNOSIS — M2560 Stiffness of unspecified joint, not elsewhere classified: Secondary | ICD-10-CM

## 2022-07-27 DIAGNOSIS — R110 Nausea: Secondary | ICD-10-CM

## 2022-07-27 LAB — CBC WITH DIFFERENTIAL/PLATELET
Basophils Absolute: 0 10*3/uL (ref 0.0–0.1)
Basophils Relative: 0.5 % (ref 0.0–3.0)
Eosinophils Absolute: 0 10*3/uL (ref 0.0–0.7)
Eosinophils Relative: 0.6 % (ref 0.0–5.0)
HCT: 37.5 % (ref 36.0–46.0)
Hemoglobin: 12.6 g/dL (ref 12.0–15.0)
Lymphocytes Relative: 52.5 % — ABNORMAL HIGH (ref 12.0–46.0)
Lymphs Abs: 2.4 10*3/uL (ref 0.7–4.0)
MCHC: 33.5 g/dL (ref 30.0–36.0)
MCV: 91.5 fl (ref 78.0–100.0)
Monocytes Absolute: 0.3 10*3/uL (ref 0.1–1.0)
Monocytes Relative: 6 % (ref 3.0–12.0)
Neutro Abs: 1.9 10*3/uL (ref 1.4–7.7)
Neutrophils Relative %: 40.4 % — ABNORMAL LOW (ref 43.0–77.0)
Platelets: 195 10*3/uL (ref 150.0–400.0)
RBC: 4.1 Mil/uL (ref 3.87–5.11)
RDW: 14.4 % (ref 11.5–15.5)
WBC: 4.7 10*3/uL (ref 4.0–10.5)

## 2022-07-27 MED ORDER — LIOTHYRONINE SODIUM 25 MCG PO TABS: 12.5000 ug | ORAL_TABLET | Freq: Every day | ORAL | 3 refills | Status: DC

## 2022-07-27 MED ORDER — CICLOPIROX 8 % EX SOLN: Freq: Every day | CUTANEOUS | 2 refills | Status: DC

## 2022-07-27 MED ORDER — DIAZEPAM 10 MG PO TABS: ORAL_TABLET | ORAL | 4 refills | Status: DC

## 2022-07-27 MED ORDER — JUBLIA 10 % EX SOLN: CUTANEOUS | 1 refills | Status: DC

## 2022-07-27 MED ORDER — ONDANSETRON HCL 8 MG PO TABS: ORAL_TABLET | ORAL | 1 refills | Status: DC

## 2022-07-27 MED ORDER — SYNTHROID 137 MCG PO TABS: 137.0000 ug | ORAL_TABLET | Freq: Every morning | ORAL | 3 refills | Status: DC

## 2022-07-27 MED ORDER — BUTALBITAL-APAP-CAFFEINE 50-325-40 MG PO TABS: 1.0000 | ORAL_TABLET | Freq: Four times a day (QID) | ORAL | 4 refills | Status: DC | PRN

## 2022-07-27 MED ORDER — BACLOFEN 10 MG PO TABS: 10.0000 mg | ORAL_TABLET | Freq: Two times a day (BID) | ORAL | 3 refills | Status: DC

## 2022-07-27 NOTE — Progress Notes (Signed)
Subjective:  Patient ID: Lisa Bruce, female    DOB: October 01, 1965  Age: 57 y.o. MRN: [id number]  CC: Medical Management of Chronic Issues (Follow up on stiff person syndrome(SPS)/)   HPI Lisa Bruce presents for (+) cologuard in 03/2022 [facility], stiff man syndrome, chronic pain, chronic anxiety  Lisa Bruce is suffering with chronic nausea.  She was supposed to have a colonoscopy which was replaced for Cologuard test due to her inability to prep.  Cologuard came back positive.  She is very afraid to schedule colonoscopy due to inability to prep herself with oral prep.  She used to see Dr. Matthias Hughs who retired.  Currently assigned GI PA with [facility] physicians has left.  Outpatient Medications Prior to Visit  Medication Sig Dispense Refill   Bacillus Coagulans-Inulin (ALIGN PREBIOTIC-PROBIOTIC PO) Take 1 capsule by mouth.     benzoyl peroxide 10 % gel Apply topically at bedtime.     bisacodyl (DULCOLAX) 5 MG EC tablet Take by mouth.     Cholecalciferol (VITAMIN D3) 125 MCG (5000 UT) TABS Take 1 tablet by mouth.     ibuprofen (ADVIL,MOTRIN) 200 MG tablet Take by mouth.     linaclotide (LINZESS) 145 MCG CAPS capsule Take 1 capsule (145 mcg total) by mouth 1 day or 1 dose. 90 capsule 3   morphine (MS CONTIN) 30 MG 12 hr tablet Take 1 tablet by mouth every 8 (eight) hours as needed.  0   baclofen (LIORESAL) 10 MG tablet Take 1 tablet (10 mg total) by mouth 2 (two) times daily. 180 tablet 3   butalbital-acetaminophen-caffeine (FIORICET) 50-325-40 MG tablet Take 1 tablet by mouth every 6 (six) hours as needed for headache. 60 tablet 4   ciclopirox (PENLAC) 8 % solution Apply topically at bedtime. Apply over nail and surrounding skin. Apply daily over previous coat. After seven (7) days, may remove with alcohol and continue cycle. 6.6 mL 2   diazepam (VALIUM) 10 MG tablet TAKE 3 TABLETS AT 6AM, 3 TABLETS AT NOON, 3 TABLETS AT 5PM, 4 TABLETS AT BEDTIME 390 tablet 4   liothyronine (CYTOMEL) 25 MCG  tablet Take 0.5 tablets (12.5 mcg total) by mouth daily. 45 tablet 3   ondansetron (ZOFRAN) 8 MG tablet USE AS DIRECTED 30 tablet 0   SYNTHROID 137 MCG tablet Take 1 tablet (137 mcg total) by mouth every morning. 90 tablet 3   No facility-administered medications prior to visit.    ROS: Review of Systems  Constitutional:  Positive for fatigue. Negative for activity change, appetite change, chills and unexpected weight change.  HENT:  Negative for congestion, mouth sores and sinus pressure.   Eyes:  Negative for visual disturbance.  Respiratory:  Negative for cough, chest tightness and wheezing.   Gastrointestinal:  Positive for constipation and nausea. Negative for abdominal pain.  Genitourinary:  Negative for difficulty urinating, frequency and vaginal pain.  Musculoskeletal:  Positive for arthralgias, back pain, gait problem, neck pain and neck stiffness.  Skin:  Negative for pallor and rash.  Neurological:  Positive for tremors, weakness and headaches. Negative for dizziness, seizures and numbness.  Psychiatric/Behavioral:  Negative for confusion, sleep disturbance and suicidal ideas. The patient is nervous/anxious.     Objective:  BP 110/76   Pulse 62   Temp 98 F (36.7 C) (Temporal)   Ht 5\' 6"  (1.676 m)   Wt 151 lb (68.5 kg)   BMI 24.37 kg/m   BP Readings from Last 3 Encounters:  07/27/22 110/76  08/04/21  118/80  05/05/21 124/70    Wt Readings from Last 3 Encounters:  07/27/22 151 lb (68.5 kg)  08/04/21 160 lb (72.6 kg)  05/05/21 150 lb (68 kg)    Physical Exam Constitutional:      General: She is not in acute distress.    Appearance: She is well-developed. She is not toxic-appearing.  HENT:     Head: Normocephalic.     Right Ear: External ear normal.     Left Ear: External ear normal.     Nose: Nose normal.  Eyes:     General:        Right eye: No discharge.        Left eye: No discharge.     Conjunctiva/sclera: Conjunctivae normal.     Pupils: Pupils  are equal, round, and reactive to light.  Neck:     Thyroid: No thyromegaly.     Vascular: No JVD.     Trachea: No tracheal deviation.  Cardiovascular:     Rate and Rhythm: Normal rate and regular rhythm.     Heart sounds: Normal heart sounds.  Pulmonary:     Effort: No respiratory distress.     Breath sounds: No stridor. No wheezing.  Abdominal:     General: Bowel sounds are normal. There is no distension.     Palpations: Abdomen is soft. There is no mass.     Tenderness: There is no abdominal tenderness. There is no guarding or rebound.  Musculoskeletal:        General: Tenderness present.     Cervical back: Normal range of motion and neck supple. No rigidity.     Right lower leg: No edema.     Left lower leg: No edema.  Lymphadenopathy:     Cervical: No cervical adenopathy.  Skin:    Findings: No erythema, lesion or rash.  Neurological:     Mental Status: She is oriented to person, place, and time. Mental status is at baseline.     Cranial Nerves: No cranial nerve deficit.     Motor: Weakness present. No abnormal muscle tone.     Coordination: Coordination abnormal.     Gait: Gait abnormal.     Deep Tendon Reflexes: Reflexes normal.  Psychiatric:        Behavior: Behavior normal.        Thought Content: Thought content normal.        Judgment: Judgment normal.   Spastic extremities, tremor, poor mobility  Lab Results  Component Value Date   WBC 5.3 12/22/2021   HGB 13.3 12/22/2021   HCT 40.0 12/22/2021   PLT 211.0 12/22/2021   GLUCOSE 109 (H) 12/22/2021   CHOL 246 (H) 12/22/2021   TRIG 125.0 12/22/2021   HDL 68.60 12/22/2021   LDLDIRECT 180.1 05/22/2010   LDLCALC 153 (H) 12/22/2021   ALT 16 12/22/2021   AST 21 12/22/2021   NA 137 12/22/2021   K 3.4 (L) 12/22/2021   CL 102 12/22/2021   CREATININE 0.85 12/22/2021   BUN 13 12/22/2021   CO2 26 12/22/2021   TSH 0.01 (L) 12/22/2021   HGBA1C 5.3 12/22/2021    DG UGI W DOUBLE CM (HD BA)  Result Date:  09/07/2018 CLINICAL DATA:  Pharyngeal esophageal dysphagia.  Loss of appetite. EXAM: UPPER GI SERIES WITHOUT KUB TECHNIQUE: Routine upper GI series was performed with thin/high density/water soluble barium. FLUOROSCOPY TIME:  Fluoroscopy Time:  5 minutes and 48 seconds. Radiation Exposure Index (if provided by the fluoroscopic device):  273 mGy Number of Acquired Spot Images: COMPARISON:  Two-view abdomen 09/06/2018 FINDINGS: Frontal and lateral views of the hypopharynx while swallowing are remarkable for laryngeal penetration to the level of the vocal cords on 1 of the initial swallows. No frank aspiration. No episodes of aspiration were observed during the remainder of the exam. Due to limitations related to the patient's clinical status/limited mobility, single contrast study was performed. Despite single-contrast study, upright assessment of the esophagus generates a double-contrast appearance due to spontaneous swallowing of air. There is no evidence for esophageal diverticulum, esophageal mass lesion, or gross mucosal ulceration. Mild narrowing of the distal esophagus evident. A 13 mm barium tablet lodges at this area of smooth mild narrowing in the distal esophagus. Patient was given 5 alternating swallows of thin barium and water and the tablet did ultimately pass into the stomach on the fifth attempt while drinking water. Assessment of esophageal motility shows preservation of the primary peristaltic stripping wave on 4/5 swallows with some minimal proximal escape. Mild tertiary contractions noted in the distal third of the esophagus. No evidence for presbyesophagus. No evidence for hiatal hernia. Single contrast imaging of the stomach with spot compression shows no gastric mass lesion or gross mucosal ulceration. Gastric emptying is prompt. Pylorus and duodenal bulb are normal. By 15 minutes after administration of the first swallow of barium, no migration of barium beyond the ligament of Treitz is  observed. IMPRESSION: 1. Gradual, smooth, mild narrowing of the distal esophagus just proximal to the esophagogastric junction. A 13 mm barium tablet becomes lodged at this location through multiple repeat swallows of thin barium and water before passing into the stomach. 2. Good preservation of primary peristaltic stripping wave with some tertiary contractions noted in the distal third of the esophagus. 3. Despite prompt gastric emptying, sluggish peristalsis noted in the duodenum. By 15 minutes after starting the procedure, no barium had migrated into the jejunum. 4. Stomach is normally positioned without evidence of hiatal hernia. No gross ulceration or mass lesion on single contrast imaging. 5. Laryngeal penetration noted on initial swallows of thick barium with penetration to the level of the vocal cords. No frank aspiration below the level of the cords. No subsequent episodes of aspiration were observed. Electronically Signed   By: Kennith Center M.D.   On: 09/07/2018 09:33    Assessment & Plan:   Problem List Items Addressed This Visit     Stiffness in joint    Baclofen, diazepam as needed  Potential benefits of a long term benzodiazepines  use as well as potential risks  and complications were explained to the patient and were aknowledged.       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 On Diazepam, Baclofen  Potential benefits of a long term opioids and benzodiazepine use as well as potential risks (i.e. addiction risk, apnea etc) and complications (i.e. Somnolence, constipation and others) were explained to the patient and were aknowledged.      Relevant Medications   baclofen (LIORESAL) 10 MG tablet   diazepam (VALIUM) 10 MG tablet   Vitamin D deficiency   Relevant Orders   VITAMIN D 25 Hydroxy (Vit-D Deficiency, Fractures)   Vitamin B12  deficiency    On B12      Relevant Orders   Vitamin B12   Anemia,  iron deficiency   Relevant Orders   CBC with Differential/Platelet   CEA   Iron, TIBC and Ferritin Panel   Chronic nausea    Recurrent nausea syndrome as needed Using Zofran prn Use apples, ginger for nausea See colonoscopy prep considerations      Positive colorectal cancer screening using Cologuard test - Primary    Pt had a positive Cologuard in February 2024- f/u [facility] Pt is reluctant to prep   Lisa Bruce is suffering with chronic nausea.  She was supposed to have a colonoscopy which was replaced for Cologuard test due to her inability to prep.  Cologuard came back positive.  She is very afraid to schedule colonoscopy due to inability to prep herself with oral prep.  She used to see Dr. Matthias Hughs who retired.  Currently assigned GI PA with [facility] physicians has left. Virtual colonoscopy will require oral prep drinking as well.  It is a difficult situation.  Eventually Lisa Bruce will need to have a colonoscopy, hopefully as soon as possible. I will order a CEA test (off label) just to see where we are at.  She is in agreement.       Relevant Orders   CEA      Meds ordered this encounter  Medications   baclofen (LIORESAL) 10 MG tablet    Sig: Take 1 tablet (10 mg total) by mouth 2 (two) times daily.    Dispense:  180 tablet    Refill:  3   butalbital-acetaminophen-caffeine (FIORICET) 50-325-40 MG tablet    Sig: Take 1 tablet by mouth every 6 (six) hours as needed for headache.    Dispense:  60 tablet    Refill:  4   diazepam (VALIUM) 10 MG tablet    Sig: TAKE 3 TABLETS AT 6AM, 3 TABLETS AT NOON, 3 TABLETS AT 5PM, 4 TABLETS AT BEDTIME    Dispense:  390 tablet    Refill:  4   liothyronine (CYTOMEL) 25 MCG tablet    Sig: Take 0.5 tablets (12.5 mcg total) by mouth daily.    Dispense:  45 tablet    Refill:  3   ondansetron (ZOFRAN) 8 MG tablet    Sig: USE AS DIRECTED    Dispense:  30 tablet    Refill:  1    SYNTHROID 137 MCG tablet    Sig: Take 1 tablet (137 mcg total) by mouth every morning.    Dispense:  90 tablet    Refill:  3   Efinaconazole (JUBLIA) 10 % SOLN    Sig: Use qd    Dispense:  8 mL    Refill:  1   ciclopirox (PENLAC) 8 % solution    Sig: Apply topically at bedtime. Apply over nail and surrounding skin. Apply daily over previous coat. After seven (7) days, may remove with alcohol and continue cycle.    Dispense:  6.6 mL    Refill:  2      Follow-up: Return in about 4 months (around 11/26/2022) for a follow-up visit.  Sonda Primes, MD

## 2022-07-27 NOTE — Assessment & Plan Note (Signed)
Pt had a positive Cologuard in February 2024- f/u [facility] Pt is reluctant to prep   Lisa Bruce is suffering with chronic nausea.  She was supposed to have a colonoscopy which was replaced for Cologuard test due to her inability to prep.  Cologuard came back positive.  She is very afraid to schedule colonoscopy due to inability to prep herself with oral prep.  She used to see Dr. Matthias Hughs who retired.  Currently assigned GI PA with [facility] physicians has left. Virtual colonoscopy will require oral prep drinking as well.  It is a difficult situation.  Eventually Lisa Bruce will need to have a colonoscopy, hopefully as soon as possible. I will order a CEA test (off label) just to see where we are at.  She is in agreement.

## 2022-07-28 LAB — T3, FREE: T3, Free: 3.2 pg/mL (ref 2.3–4.2)

## 2022-07-28 LAB — IRON,TIBC AND FERRITIN PANEL
%SAT: 46 % (calc) — ABNORMAL HIGH (ref 16–45)
Ferritin: 12 ng/mL — ABNORMAL LOW (ref 16–232)
Iron: 145 ug/dL (ref 45–160)
TIBC: 312 mcg/dL (calc) (ref 250–450)

## 2022-07-28 LAB — COMPREHENSIVE METABOLIC PANEL
ALT: 16 U/L (ref 0–35)
AST: 24 U/L (ref 0–37)
Albumin: 4 g/dL (ref 3.5–5.2)
Alkaline Phosphatase: 110 U/L (ref 39–117)
BUN: 12 mg/dL (ref 6–23)
CO2: 23 mEq/L (ref 19–32)
Calcium: 9.2 mg/dL (ref 8.4–10.5)
Chloride: 106 mEq/L (ref 96–112)
Creatinine, Ser: 0.7 mg/dL (ref 0.40–1.20)
GFR: 96.73 mL/min (ref >60.00)
Glucose, Bld: 86 mg/dL (ref 70–99)
Potassium: 4.1 mEq/L (ref 3.5–5.1)
Sodium: 136 mEq/L (ref 135–145)
Total Bilirubin: 0.3 mg/dL (ref 0.2–1.2)
Total Protein: 7.5 g/dL (ref 6.0–8.3)

## 2022-07-28 LAB — LIPID PANEL
Cholesterol: 194 mg/dL (ref 0–200)
HDL: 57.8 mg/dL (ref >39.00)
LDL Cholesterol: 112 mg/dL — ABNORMAL HIGH (ref 0–99)
NonHDL: 136.17
Total CHOL/HDL Ratio: 3
Triglycerides: 122 mg/dL (ref 0.0–149.0)
VLDL: 24.4 mg/dL (ref 0.0–40.0)

## 2022-07-28 LAB — TSH: TSH: <0.01 u[IU]/mL — ABNORMAL LOW (ref 0.35–5.50)

## 2022-07-28 LAB — T4, FREE: Free T4: 1.01 ng/dL (ref 0.60–1.60)

## 2022-07-28 LAB — CEA: CEA: <2 ng/mL

## 2022-07-28 LAB — VITAMIN D 25 HYDROXY (VIT D DEFICIENCY, FRACTURES): VITD: 46.16 ng/mL (ref 30.00–100.00)

## 2022-07-28 LAB — VITAMIN B12: Vitamin B-12: 200 pg/mL — ABNORMAL LOW (ref 211–911)

## 2022-07-28 NOTE — Assessment & Plan Note (Signed)
No change. On Diazepam, Baclofen  Potential benefits of a long term opioids and benzodiazepine use as well as potential risks (i.e. addiction risk, apnea etc) and complications (i.e. Somnolence, constipation and others) were explained to the patient and were aknowledged.

## 2022-07-28 NOTE — Assessment & Plan Note (Signed)
On B12 

## 2022-07-28 NOTE — Assessment & Plan Note (Signed)
Recurrent nausea syndrome as needed Using Zofran prn Use apples, ginger for nausea See colonoscopy prep considerations

## 2022-07-28 NOTE — Assessment & Plan Note (Signed)
Baclofen, diazepam as needed  Potential benefits of a long term benzodiazepines  use as well as potential risks  and complications were explained to the patient and were aknowledged.  

## 2022-07-29 ENCOUNTER — Other Ambulatory Visit: Payer: Self-pay | Admitting: Internal Medicine

## 2022-07-29 ENCOUNTER — Telehealth: Payer: Self-pay | Admitting: *Deleted

## 2022-07-29 DIAGNOSIS — E538 Deficiency of other specified B group vitamins: Secondary | ICD-10-CM

## 2022-07-29 MED ORDER — CYANOCOBALAMIN 1000 MCG/ML IJ SOLN: 1000.0000 ug | INTRAMUSCULAR | 6 refills | Status: AC

## 2022-07-29 MED ORDER — "BD ECLIPSE SYRINGE 25G X 1"" 3 ML MISC": 3 refills | Status: AC

## 2022-07-29 NOTE — Telephone Encounter (Signed)
REC'D FAX PT IS REQUESTING A NEW RX FOR ALTERNATIVE FOR CICLOPIROX

## 2022-07-29 NOTE — Assessment & Plan Note (Signed)
Low levels Start SQ inj 1000 mcg q 2 wks

## 2022-07-31 ENCOUNTER — Telehealth: Payer: Self-pay | Admitting: Internal Medicine

## 2022-07-31 NOTE — Telephone Encounter (Signed)
Patient would like to know if she can get a B12 shot anywhere besides our office. She said it would be difficult to come to the office due to transportation issues. She would like a call back at [phone].

## 2022-07-31 NOTE — Telephone Encounter (Signed)
Pt called wanting to know where she can get vitamin D done.Please advise.

## 2022-07-31 NOTE — Telephone Encounter (Signed)
[facility] has [facility] prescription on paper prescription, which is an alternative to [place].  Thanks

## 2022-08-03 NOTE — Telephone Encounter (Signed)
Notified pt w/MD response. Pt states she will call back to schedule 1st nurse visit and bring husband so he could know how to give.Marland KitchenRaechel Chute

## 2022-08-03 NOTE — Telephone Encounter (Signed)
Her husband can give her B12 shots at home subcutaneously.  Thanks

## 2022-08-03 NOTE — Telephone Encounter (Signed)
Duplicate msg.. see previous msg../lmb 

## 2022-08-24 DIAGNOSIS — Z79891 Long term (current) use of opiate analgesic: Secondary | ICD-10-CM | POA: Diagnosis not present

## 2022-08-24 DIAGNOSIS — G894 Chronic pain syndrome: Secondary | ICD-10-CM | POA: Diagnosis not present

## 2022-08-24 DIAGNOSIS — G259 Extrapyramidal and movement disorder, unspecified: Secondary | ICD-10-CM | POA: Diagnosis not present

## 2022-08-24 DIAGNOSIS — M791 Myalgia, unspecified site: Secondary | ICD-10-CM | POA: Diagnosis not present

## 2022-09-17 ENCOUNTER — Telehealth: Payer: Self-pay | Admitting: Internal Medicine

## 2022-09-17 NOTE — Telephone Encounter (Signed)
Per chart med has not been filled since 2018. Pls advise.Marland KitchenRaechel Chute

## 2022-09-17 NOTE — Telephone Encounter (Signed)
Patient wants to know if you can take over prescribing her linzess - her GI doctor has left and she is almost out of this.  Please send to [facility] in [place].  Please call patient and let her know.  [phone]

## 2022-09-18 ENCOUNTER — Other Ambulatory Visit (HOSPITAL_COMMUNITY): Payer: Self-pay

## 2022-09-18 ENCOUNTER — Encounter: Payer: Self-pay | Admitting: Family

## 2022-09-18 ENCOUNTER — Telehealth: Payer: Self-pay

## 2022-09-18 ENCOUNTER — Other Ambulatory Visit: Payer: Self-pay | Admitting: Internal Medicine

## 2022-09-18 MED ORDER — LINACLOTIDE 145 MCG PO CAPS: 145.0000 ug | ORAL_CAPSULE | ORAL | 3 refills | Status: DC

## 2022-09-18 NOTE — Telephone Encounter (Signed)
OK. Thx

## 2022-09-18 NOTE — Telephone Encounter (Signed)
Notified pt MD sent rxto [facility].../lmb 

## 2022-09-18 NOTE — Telephone Encounter (Signed)
Need PA on Linzess.Marland KitchenRaechel Chute

## 2022-09-18 NOTE — Telephone Encounter (Signed)
Lisa Bruce, CPhT  Caitlin Ainley, Dairl Ponder, RMA Caller: Unspecified (Today, 10:23 AM) PA request has been Submitted. New Encounter created for follow up. For additional info see Pharmacy Prior Auth telephone encounter from 09/18/22.

## 2022-09-18 NOTE — Telephone Encounter (Signed)
Pharmacy Patient Advocate Encounter   Received notification from RX Request Messages that prior authorization for Linzess is required/requested.   Insurance verification completed.   The patient is insured through [facility] .   Per test claim: PA required; PA started via CoverMyMeds. KEY [id number] . Waiting for clinical questions to populate.

## 2022-09-21 NOTE — Telephone Encounter (Signed)
Clinical questions answered and PA submitted

## 2022-09-22 NOTE — Telephone Encounter (Signed)
Noted../lmb 

## 2022-09-22 NOTE — Telephone Encounter (Signed)
[employer] called..  PA has been approved - they will be sending a fax confirmation

## 2022-09-24 NOTE — Telephone Encounter (Signed)
Pharmacy Patient Advocate Encounter  Received notification from [facility] that Prior Authorization for Linzess has been APPROVED from 09/21/22 to 09/21/23   PA #/Case ID/Reference #: [id number]

## 2022-09-29 ENCOUNTER — Telehealth: Payer: BC Managed Care – PPO | Admitting: Internal Medicine

## 2022-10-12 DIAGNOSIS — M791 Myalgia, unspecified site: Secondary | ICD-10-CM | POA: Diagnosis not present

## 2022-10-12 DIAGNOSIS — G894 Chronic pain syndrome: Secondary | ICD-10-CM | POA: Diagnosis not present

## 2022-10-12 DIAGNOSIS — Z79891 Long term (current) use of opiate analgesic: Secondary | ICD-10-CM | POA: Diagnosis not present

## 2022-10-12 DIAGNOSIS — G259 Extrapyramidal and movement disorder, unspecified: Secondary | ICD-10-CM | POA: Diagnosis not present

## 2022-11-23 ENCOUNTER — Encounter: Payer: Self-pay | Admitting: Internal Medicine

## 2022-11-23 ENCOUNTER — Telehealth (INDEPENDENT_AMBULATORY_CARE_PROVIDER_SITE_OTHER): Payer: BC Managed Care – PPO | Admitting: Internal Medicine

## 2022-11-23 DIAGNOSIS — E063 Autoimmune thyroiditis: Secondary | ICD-10-CM

## 2022-11-23 DIAGNOSIS — M2560 Stiffness of unspecified joint, not elsewhere classified: Secondary | ICD-10-CM | POA: Diagnosis not present

## 2022-11-23 DIAGNOSIS — R739 Hyperglycemia, unspecified: Secondary | ICD-10-CM

## 2022-11-23 DIAGNOSIS — G44039 Episodic paroxysmal hemicrania, not intractable: Secondary | ICD-10-CM

## 2022-11-23 DIAGNOSIS — G2582 Stiff-man syndrome: Secondary | ICD-10-CM

## 2022-11-23 DIAGNOSIS — R5382 Chronic fatigue, unspecified: Secondary | ICD-10-CM

## 2022-11-23 DIAGNOSIS — E538 Deficiency of other specified B group vitamins: Secondary | ICD-10-CM

## 2022-11-23 DIAGNOSIS — G894 Chronic pain syndrome: Secondary | ICD-10-CM

## 2022-11-23 MED ORDER — BUTALBITAL-APAP-CAFFEINE 50-325-40 MG PO TABS: 1.0000 | ORAL_TABLET | Freq: Four times a day (QID) | ORAL | 4 refills | Status: DC | PRN

## 2022-11-23 MED ORDER — SYNTHROID 137 MCG PO TABS: 137.0000 ug | ORAL_TABLET | Freq: Every morning | ORAL | 3 refills | Status: DC

## 2022-11-23 MED ORDER — CICLOPIROX 8 % EX SOLN: Freq: Every day | CUTANEOUS | 2 refills | Status: DC

## 2022-11-23 MED ORDER — JUBLIA 10 % EX SOLN: CUTANEOUS | 1 refills | Status: AC

## 2022-11-23 MED ORDER — DIAZEPAM 10 MG PO TABS: ORAL_TABLET | ORAL | 4 refills | Status: DC

## 2022-11-23 MED ORDER — ONDANSETRON HCL 8 MG PO TABS: ORAL_TABLET | ORAL | 1 refills | Status: DC

## 2022-11-23 MED ORDER — BACLOFEN 10 MG PO TABS: 10.0000 mg | ORAL_TABLET | Freq: Two times a day (BID) | ORAL | 3 refills | Status: DC

## 2022-11-23 MED ORDER — LIOTHYRONINE SODIUM 25 MCG PO TABS: 12.5000 ug | ORAL_TABLET | Freq: Every day | ORAL | 3 refills | Status: DC

## 2022-11-23 NOTE — Assessment & Plan Note (Signed)
CFS flare up Labs ordered today

## 2022-11-23 NOTE — Assessment & Plan Note (Signed)
Check A1c. 

## 2022-11-23 NOTE — Progress Notes (Signed)
Virtual Visit via Video Note  I connected with Lisa Bruce on 11/23/22 at  4:00 PM EDT by a video enabled telemedicine application and verified that I am speaking with the correct person using two identifiers.   I discussed the limitations of evaluation and management by telemedicine and the availability of in person appointments. The patient expressed understanding and agreed to proceed.  I was located at our [facility] office. The patient was at home. There was Darren present in the visit.  Chief Complaint  Patient presents with   Medical Management of Chronic Issues    3-4 month f/u. Very fatigue, needs blood work done.   Emesis    Since Saturday .        History of Present Illness:   Review of Systems  Constitutional:  Positive for diaphoresis and malaise/fatigue. Negative for fever and weight loss.  HENT:  Positive for congestion. Negative for sinus pain and sore throat.   Cardiovascular:  Negative for chest pain.  Gastrointestinal:  Positive for nausea. Negative for vomiting.  Genitourinary:  Positive for frequency.  Musculoskeletal:  Positive for back pain, falls, joint pain, myalgias and neck pain.  Neurological:  Positive for dizziness, tremors, speech change, focal weakness, weakness and headaches. Negative for loss of consciousness.  Endo/Heme/Allergies:  Bruises/bleeds easily.  Psychiatric/Behavioral:  Negative for memory loss. The patient is nervous/anxious.      Observations/Objective: The patient appears to be in no acute distress  Assessment and Plan:  Problem List Items Addressed This Visit     Stiffness in joint - Primary    Baclofen, diazepam as needed  Potential benefits of a long term benzodiazepines  use as well as potential risks  and complications were explained to the patient and were aknowledged.       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 addiction risk, apnea etc) and complications (i.e. Somnolence, constipation and others) were explained to the patient and were aknowledged.      Relevant Medications   baclofen (LIORESAL) 10 MG tablet   diazepam (VALIUM) 10 MG tablet   Other Relevant Orders   CBC with Differential/Platelet   Comprehensive metabolic panel   CK   Iron, TIBC and Ferritin Panel   Lipase   T3, free   T4, free   TSH   Urinalysis   VITAMIN D 25 Hydroxy (Vit-D Deficiency, Fractures)   Vitamin B12   Vitamin B12 deficiency    Low levels Start SQ inj 1000 mcg q 2 wks      Headache    Chronic HAs relieved by Fioricet only x many years Cont on Fioricet  Potential benefits of a long term Fioricet use as well as potential risks  and complications were explained to the patient and were aknowledged. On Diazepam,  Baclofen as well  Potential benefits of a long term opioids and benzodiazepine and fioricet use as well as potential risks (i.e. addiction risk, apnea etc) and complications (i.e. Somnolence, constipation and others) were explained to the patient and were aknowledged.       Relevant Medications   baclofen (LIORESAL) 10 MG tablet   butalbital-acetaminophen-caffeine (FIORICET) 50-325-40 MG tablet   Chronic pain syndrome    F/u w/Dr Vear Clock      Relevant Medications   baclofen (LIORESAL) 10 MG tablet   butalbital-acetaminophen-caffeine (FIORICET) 50-325-40 MG tablet   Hyperglycemia    Check A1c      Relevant Orders    Hemoglobin A1c     Meds ordered this encounter  Medications   baclofen (LIORESAL) 10 MG tablet    Sig: Take 1 tablet (10 mg total) by mouth 2 (two) times daily.    Dispense:  180 tablet    Refill:  3   butalbital-acetaminophen-caffeine (FIORICET) 50-325-40 MG tablet    Sig: Take 1 tablet by mouth every 6 (six) hours as needed for headache.    Dispense:  60 tablet    Refill:  4   ciclopirox (PENLAC) 8 % solution    Sig: Apply topically at bedtime. Apply over nail and surrounding skin. Apply daily over previous coat. After seven (7) days, may remove with alcohol and continue cycle.    Dispense:  6.6 mL    Refill:  2   diazepam (VALIUM) 10 MG tablet    Sig: TAKE 3 TABLETS AT 6AM, 3 TABLETS AT NOON, 3 TABLETS AT 5PM, 4 TABLETS AT BEDTIME    Dispense:  390 tablet    Refill:  4   Efinaconazole (JUBLIA) 10 % SOLN    Sig: Use qd    Dispense:  8 mL    Refill:  1   liothyronine (CYTOMEL) 25 MCG tablet    Sig: Take 0.5 tablets (12.5 mcg total) by mouth daily.    Dispense:  45 tablet    Refill:  3   ondansetron (ZOFRAN) 8 MG tablet    Sig: USE AS DIRECTED    Dispense:  30 tablet    Refill:  1   SYNTHROID 137 MCG tablet    Sig: Take 1 tablet (137 mcg total) by mouth every morning.    Dispense:  90 tablet    Refill:  3     Follow Up Instructions:    I discussed the assessment and treatment plan with the patient. The patient was provided an opportunity to ask questions and all were answered. The patient agreed with the plan and demonstrated an understanding of the instructions.   The patient was advised to call back or seek an in-person evaluation if the symptoms worsen or if the condition fails to improve as anticipated.  I provided face-to-face time during this encounter. We were at different locations.   Sonda Primes, MD

## 2022-11-23 NOTE — Assessment & Plan Note (Signed)
No change. On Diazepam, Baclofen  Potential benefits of a long term opioids and benzodiazepine use as well as potential risks (i.e. addiction risk, apnea etc) and complications (i.e. Somnolence, constipation and others) were explained to the patient and were aknowledged.

## 2022-11-23 NOTE — Assessment & Plan Note (Signed)
Cont on  Levothroid, Cytomel

## 2022-11-23 NOTE — Assessment & Plan Note (Signed)
Chronic HAs relieved by Fioricet only x many years Cont on Fioricet  Potential benefits of a long term Fioricet use as well as potential risks  and complications were explained to the patient and were aknowledged. On Diazepam, Baclofen as well  Potential benefits of a long term opioids and benzodiazepine and fioricet use as well as potential risks (i.e. addiction risk, apnea etc) and complications (i.e. Somnolence, constipation and others) were explained to the patient and were aknowledged.

## 2022-11-23 NOTE — Assessment & Plan Note (Signed)
F/u w/Dr Hardin Negus

## 2022-11-23 NOTE — Assessment & Plan Note (Signed)
Baclofen, diazepam as needed  Potential benefits of a long term benzodiazepines  use as well as potential risks  and complications were explained to the patient and were aknowledged.

## 2022-11-23 NOTE — Assessment & Plan Note (Signed)
Low levels Start SQ inj 1000 mcg q 2 wks

## 2022-12-14 DIAGNOSIS — M791 Myalgia, unspecified site: Secondary | ICD-10-CM | POA: Diagnosis not present

## 2022-12-14 DIAGNOSIS — Z79891 Long term (current) use of opiate analgesic: Secondary | ICD-10-CM | POA: Diagnosis not present

## 2022-12-14 DIAGNOSIS — G259 Extrapyramidal and movement disorder, unspecified: Secondary | ICD-10-CM | POA: Diagnosis not present

## 2022-12-14 DIAGNOSIS — G894 Chronic pain syndrome: Secondary | ICD-10-CM | POA: Diagnosis not present

## 2023-01-25 ENCOUNTER — Other Ambulatory Visit (INDEPENDENT_AMBULATORY_CARE_PROVIDER_SITE_OTHER): Payer: BC Managed Care – PPO

## 2023-01-25 DIAGNOSIS — G894 Chronic pain syndrome: Secondary | ICD-10-CM | POA: Diagnosis not present

## 2023-01-25 DIAGNOSIS — R7989 Other specified abnormal findings of blood chemistry: Secondary | ICD-10-CM

## 2023-01-25 DIAGNOSIS — M791 Myalgia, unspecified site: Secondary | ICD-10-CM | POA: Diagnosis not present

## 2023-01-25 DIAGNOSIS — G2582 Stiff-man syndrome: Secondary | ICD-10-CM

## 2023-01-25 DIAGNOSIS — Z79891 Long term (current) use of opiate analgesic: Secondary | ICD-10-CM | POA: Diagnosis not present

## 2023-01-25 DIAGNOSIS — R739 Hyperglycemia, unspecified: Secondary | ICD-10-CM | POA: Diagnosis not present

## 2023-01-25 DIAGNOSIS — G259 Extrapyramidal and movement disorder, unspecified: Secondary | ICD-10-CM | POA: Diagnosis not present

## 2023-01-25 DIAGNOSIS — R5382 Chronic fatigue, unspecified: Secondary | ICD-10-CM | POA: Diagnosis not present

## 2023-01-25 LAB — CBC WITH DIFFERENTIAL/PLATELET
Basophils Absolute: 0 10*3/uL (ref 0.0–0.1)
Basophils Relative: 0.5 % (ref 0.0–3.0)
Eosinophils Absolute: 0 10*3/uL (ref 0.0–0.7)
Eosinophils Relative: 1 % (ref 0.0–5.0)
HCT: 40 % (ref 36.0–46.0)
Hemoglobin: 13.3 g/dL (ref 12.0–15.0)
Lymphocytes Relative: 56.1 % — ABNORMAL HIGH (ref 12.0–46.0)
Lymphs Abs: 2.5 10*3/uL (ref 0.7–4.0)
MCHC: 33.1 g/dL (ref 30.0–36.0)
MCV: 92.6 fL (ref 78.0–100.0)
Monocytes Absolute: 0.3 10*3/uL (ref 0.1–1.0)
Monocytes Relative: 7.6 % (ref 3.0–12.0)
Neutro Abs: 1.6 10*3/uL (ref 1.4–7.7)
Neutrophils Relative %: 34.8 % — ABNORMAL LOW (ref 43.0–77.0)
Platelets: 201 10*3/uL (ref 150.0–400.0)
RBC: 4.32 Mil/uL (ref 3.87–5.11)
RDW: 14.1 % (ref 11.5–15.5)
WBC: 4.5 10*3/uL (ref 4.0–10.5)

## 2023-01-25 LAB — COMPREHENSIVE METABOLIC PANEL
ALT: 29 U/L (ref 0–35)
AST: 39 U/L — ABNORMAL HIGH (ref 0–37)
Albumin: 4.3 g/dL (ref 3.5–5.2)
Alkaline Phosphatase: 167 U/L — ABNORMAL HIGH (ref 39–117)
BUN: 14 mg/dL (ref 6–23)
CO2: 23 meq/L (ref 19–32)
Calcium: 8.9 mg/dL (ref 8.4–10.5)
Chloride: 105 meq/L (ref 96–112)
Creatinine, Ser: 0.87 mg/dL (ref 0.40–1.20)
GFR: 74.26 mL/min (ref >60.00)
Glucose, Bld: 88 mg/dL (ref 70–99)
Potassium: 3.8 meq/L (ref 3.5–5.1)
Sodium: 138 meq/L (ref 135–145)
Total Bilirubin: 0.4 mg/dL (ref 0.2–1.2)
Total Protein: 7.6 g/dL (ref 6.0–8.3)

## 2023-01-25 LAB — T4, FREE: Free T4: 1.07 ng/dL (ref 0.60–1.60)

## 2023-01-25 LAB — VITAMIN D 25 HYDROXY (VIT D DEFICIENCY, FRACTURES): VITD: 50.08 ng/mL (ref 30.00–100.00)

## 2023-01-25 LAB — VITAMIN B12: Vitamin B-12: 1017 pg/mL — ABNORMAL HIGH (ref 211–911)

## 2023-01-25 LAB — CK: Total CK: 36 U/L (ref 7–177)

## 2023-01-25 LAB — LIPASE: Lipase: 41 U/L (ref 11.0–59.0)

## 2023-01-25 LAB — T3, FREE: T3, Free: 4 pg/mL (ref 2.3–4.2)

## 2023-01-25 LAB — HEMOGLOBIN A1C: Hgb A1c MFr Bld: 5.2 % (ref 4.6–6.5)

## 2023-01-25 LAB — TSH: TSH: 0.01 u[IU]/mL — ABNORMAL LOW (ref 0.35–5.50)

## 2023-01-26 LAB — IRON,TIBC AND FERRITIN PANEL
%SAT: 54 % — ABNORMAL HIGH (ref 16–45)
Ferritin: 12 ng/mL — ABNORMAL LOW (ref 16–232)
Iron: 168 ug/dL — ABNORMAL HIGH (ref 45–160)
TIBC: 313 ug/dL (ref 250–450)

## 2023-01-27 ENCOUNTER — Telehealth: Payer: Self-pay | Admitting: Internal Medicine

## 2023-01-27 NOTE — Telephone Encounter (Signed)
Patient returned Lisa Bruce's call regarding her labs. She would like a call back at [phone].

## 2023-01-27 NOTE — Progress Notes (Signed)
Pt called and wanted [place] location changed

## 2023-01-27 NOTE — Telephone Encounter (Signed)
Called and spoke with PT. PT requesting that ultrasound be placed with [facility]. We were able to specify location for her and will be contacting her soon.  PT would like advice and follow up on their condition (please check last note form PT's labs). PT dealing with fatigue, change in foods she is capable of eating, and needs to know if she has should stop taking vitamin b-12 OTC tablets? She has not started injections yet.

## 2023-01-27 NOTE — Telephone Encounter (Signed)
Was able to follow up with PT after advice from PCP and she expressed understanding. PT will start taking b-12 every other day instead of daily and will be following up with Imaging at [facility] to get ultrasound scheduled.

## 2023-01-27 NOTE — Addendum Note (Signed)
Addended by: Marinus Maw on: 01/27/2023 11:43 AM   Modules accepted: Orders

## 2023-01-27 NOTE — Telephone Encounter (Signed)
Patient said she was unable to get in contact with [facility] today, so she would like the order to go back to [facility]. Best callback is [phone].

## 2023-01-27 NOTE — Telephone Encounter (Signed)
Noted. Thanks.

## 2023-01-28 NOTE — Telephone Encounter (Signed)
Called and spoke with PT and gave them alternate number for [facility]. If PT can not contact the office we will change referral back.

## 2023-01-29 ENCOUNTER — Ambulatory Visit (HOSPITAL_BASED_OUTPATIENT_CLINIC_OR_DEPARTMENT_OTHER)
Admission: RE | Admit: 2023-01-29 | Discharge: 2023-01-29 | Disposition: A | Discharge status: Home / Self Care | Payer: BC Managed Care – PPO | Source: Ambulatory Visit | Attending: Internal Medicine | Admitting: Internal Medicine

## 2023-01-29 DIAGNOSIS — R7989 Other specified abnormal findings of blood chemistry: Secondary | ICD-10-CM | POA: Diagnosis not present

## 2023-01-29 DIAGNOSIS — Z9049 Acquired absence of other specified parts of digestive tract: Secondary | ICD-10-CM | POA: Diagnosis not present

## 2023-01-29 DIAGNOSIS — K838 Other specified diseases of biliary tract: Secondary | ICD-10-CM | POA: Diagnosis not present

## 2023-01-29 NOTE — Telephone Encounter (Signed)
Patient called and asked to speak with Zack. She wanted to let him know that she had her ultrasound done today. Best callback is [phone].

## 2023-02-02 NOTE — Telephone Encounter (Signed)
Called and spoke with PT and informed them that we were still waiting on the ultrasound results to be reviewed and would contact her when they were in.

## 2023-02-05 ENCOUNTER — Telehealth: Payer: Self-pay

## 2023-02-05 DIAGNOSIS — K838 Other specified diseases of biliary tract: Secondary | ICD-10-CM

## 2023-02-05 NOTE — Telephone Encounter (Signed)
Copied from CRM [id number]. Topic: Clinical - Request for Lab/Test Order >> Feb 05, 2023  1:05 PM Theodis Sato wrote: Reason for CRM: PT would like to send a message to Dr. Posey Rea or nurses about sending an order to [facility] on [address], As PT states she would to schedule an appointment Monday Dec 23- Please call PT back to let her know the order has been sent over.

## 2023-02-08 ENCOUNTER — Other Ambulatory Visit: Payer: Self-pay | Admitting: Internal Medicine

## 2023-02-08 NOTE — Telephone Encounter (Signed)
Copied from CRM [id number]. Topic: Clinical - Request for Lab/Test Order >> Feb 08, 2023 12:42 PM Turkey A wrote: Reason for CRM: Patient is calling to have message sent to Dr. Posey Rea regarding order for MRI. Patient also called on 02/05/2023 regarding MRI order

## 2023-02-09 NOTE — Telephone Encounter (Signed)
Okay.  Will do.  Thanks °

## 2023-02-09 NOTE — Addendum Note (Signed)
Addended by: Tresa Garter on: 02/09/2023 11:09 AM   Modules accepted: Orders

## 2023-02-15 ENCOUNTER — Telehealth: Payer: Self-pay | Admitting: Internal Medicine

## 2023-02-15 NOTE — Telephone Encounter (Signed)
Copied from CRM [id number]. Topic: Referral - Question >> Feb 15, 2023  2:50 PM Lisa Bruce wrote: Reason for CRM: Patient is calling in regard to her MRI/MRA order for her liver, patient states she can not schedule because they have not received the order.  [facility] in [place].  --- ORDER IS PENDING, WAITING ON RESPONSE FROM INSURANCE.  TDW

## 2023-02-16 NOTE — Telephone Encounter (Signed)
Abd MRI was ordered on 02/09/23 Thx

## 2023-03-04 ENCOUNTER — Telehealth: Payer: Self-pay

## 2023-03-04 NOTE — Telephone Encounter (Signed)
Copied from CRM [id number]. Topic: Clinical - Request for Lab/Test Order >> Mar 04, 2023 11:12 AM Denese Killings wrote: Reason for CRM: Patient is requesting a call from [facility] in regards to a MRI order and other questions about her regular follow up visit.

## 2023-03-05 ENCOUNTER — Ambulatory Visit (HOSPITAL_BASED_OUTPATIENT_CLINIC_OR_DEPARTMENT_OTHER)
Admission: RE | Admit: 2023-03-05 | Discharge: 2023-03-05 | Disposition: A | Discharge status: Home / Self Care | Payer: BC Managed Care – PPO | Source: Ambulatory Visit | Attending: Internal Medicine | Admitting: Internal Medicine

## 2023-03-05 DIAGNOSIS — K769 Liver disease, unspecified: Secondary | ICD-10-CM | POA: Diagnosis not present

## 2023-03-05 DIAGNOSIS — K7689 Other specified diseases of liver: Secondary | ICD-10-CM | POA: Insufficient documentation

## 2023-03-05 DIAGNOSIS — Z9049 Acquired absence of other specified parts of digestive tract: Secondary | ICD-10-CM | POA: Diagnosis not present

## 2023-03-05 DIAGNOSIS — K838 Other specified diseases of biliary tract: Secondary | ICD-10-CM | POA: Insufficient documentation

## 2023-03-05 MED ORDER — GADOBUTROL 1 MMOL/ML IV SOLN
6.8000 mL | Freq: Once | INTRAVENOUS | Status: AC | PRN
  Administered 2023-03-05: 6.8 mL via INTRAVENOUS
  Filled 2023-03-05: qty 7.5

## 2023-03-05 NOTE — Telephone Encounter (Signed)
Spoke with the pt and was able to inform pt we do see she has had her MRI and we are awaiting these results. Pt has been made aware that she will be contacted with MRI results once provider has gotten them and reviewed the results.  Pt will be speaking with husband to determine which day is best for them to do a VV for pts f/u and discuss MRI.

## 2023-03-11 ENCOUNTER — Other Ambulatory Visit: Payer: Self-pay | Admitting: Internal Medicine

## 2023-03-11 DIAGNOSIS — K838 Other specified diseases of biliary tract: Secondary | ICD-10-CM

## 2023-03-15 ENCOUNTER — Telehealth: Payer: Self-pay | Admitting: Internal Medicine

## 2023-03-15 NOTE — Telephone Encounter (Signed)
Patient would like a call back to go over recent MRI results. Best callback is [phone].

## 2023-03-16 NOTE — Telephone Encounter (Signed)
Copied from CRM [id number]. Topic: Clinical - Lab/Test Results >> Mar 16, 2023  3:28 PM Lisa Bruce wrote: Reason for CRM: Patient wants [name] to make a copy of her Ultrasound results from December and MRI results from this month and keep them at the front desk so her husband can pick it up  tomorrow. Patient wants them in separate envelopes.  *Patient stated that you don't need to call her unless you have questions

## 2023-03-16 NOTE — Telephone Encounter (Signed)
Spoke with the pt and was able to inform her of MRI results. I was also able to inform her of repeated blood work that is needed.  Pt is wanting to know if her colon can be seen in the MRI and if there was any cancer seen.

## 2023-03-17 NOTE — Telephone Encounter (Signed)
Documents have been placed up front for pts husband to pick up.

## 2023-03-23 DIAGNOSIS — G259 Extrapyramidal and movement disorder, unspecified: Secondary | ICD-10-CM | POA: Diagnosis not present

## 2023-03-23 DIAGNOSIS — M791 Myalgia, unspecified site: Secondary | ICD-10-CM | POA: Diagnosis not present

## 2023-03-23 DIAGNOSIS — Z79891 Long term (current) use of opiate analgesic: Secondary | ICD-10-CM | POA: Diagnosis not present

## 2023-03-23 DIAGNOSIS — G894 Chronic pain syndrome: Secondary | ICD-10-CM | POA: Diagnosis not present

## 2023-04-06 ENCOUNTER — Telehealth: Payer: Self-pay | Admitting: Internal Medicine

## 2023-04-09 ENCOUNTER — Telehealth: Payer: BC Managed Care – PPO | Admitting: Internal Medicine

## 2023-04-13 ENCOUNTER — Ambulatory Visit: Payer: BC Managed Care – PPO | Admitting: Internal Medicine

## 2023-04-13 ENCOUNTER — Encounter: Payer: Self-pay | Admitting: Internal Medicine

## 2023-04-13 VITALS — BP 122/70 | Temp 98.0°F | Ht 66.0 in | Wt 146.0 lb

## 2023-04-13 DIAGNOSIS — E063 Autoimmune thyroiditis: Secondary | ICD-10-CM | POA: Diagnosis not present

## 2023-04-13 DIAGNOSIS — E038 Other specified hypothyroidism: Secondary | ICD-10-CM

## 2023-04-13 DIAGNOSIS — R5382 Chronic fatigue, unspecified: Secondary | ICD-10-CM

## 2023-04-13 DIAGNOSIS — R110 Nausea: Secondary | ICD-10-CM

## 2023-04-13 DIAGNOSIS — K5901 Slow transit constipation: Secondary | ICD-10-CM

## 2023-04-13 DIAGNOSIS — F1129 Opioid dependence with unspecified opioid-induced disorder: Secondary | ICD-10-CM

## 2023-04-13 DIAGNOSIS — R768 Other specified abnormal immunological findings in serum: Secondary | ICD-10-CM

## 2023-04-13 DIAGNOSIS — R7689 Other specified abnormal immunological findings in serum: Secondary | ICD-10-CM

## 2023-04-13 DIAGNOSIS — R195 Other fecal abnormalities: Secondary | ICD-10-CM

## 2023-04-13 DIAGNOSIS — D508 Other iron deficiency anemias: Secondary | ICD-10-CM

## 2023-04-13 MED ORDER — CICLOPIROX 8 % EX SOLN: Freq: Every day | CUTANEOUS | 2 refills | Status: DC

## 2023-04-13 MED ORDER — SYNTHROID 137 MCG PO TABS: 137.0000 ug | ORAL_TABLET | Freq: Every morning | ORAL | 3 refills | Status: DC

## 2023-04-13 MED ORDER — ONDANSETRON HCL 8 MG PO TABS: 8.0000 mg | ORAL_TABLET | ORAL | 2 refills | Status: DC

## 2023-04-13 MED ORDER — LINACLOTIDE 145 MCG PO CAPS: 145.0000 ug | ORAL_CAPSULE | ORAL | 3 refills | Status: DC

## 2023-04-13 MED ORDER — BUTALBITAL-APAP-CAFFEINE 50-325-40 MG PO TABS: 1.0000 | ORAL_TABLET | Freq: Four times a day (QID) | ORAL | 4 refills | Status: DC | PRN

## 2023-04-13 MED ORDER — BACLOFEN 10 MG PO TABS: 10.0000 mg | ORAL_TABLET | Freq: Two times a day (BID) | ORAL | 3 refills | Status: DC

## 2023-04-13 MED ORDER — LIOTHYRONINE SODIUM 25 MCG PO TABS
12.5000 ug | ORAL_TABLET | Freq: Every day | ORAL | 3 refills | Status: AC
Start: 2023-04-13 — End: ?

## 2023-04-13 MED ORDER — DIAZEPAM 10 MG PO TABS: ORAL_TABLET | ORAL | 4 refills | Status: DC

## 2023-04-13 NOTE — Patient Instructions (Addendum)
 Try Kefir

## 2023-04-13 NOTE — Assessment & Plan Note (Signed)
 Cont on  Levothroid, Cytomel

## 2023-04-13 NOTE — Assessment & Plan Note (Signed)
 [name] saw Dr Baxter Flattery (R[facility]um at [facility])

## 2023-04-13 NOTE — Assessment & Plan Note (Signed)
 S/p capsule endoscopy - remote; slow motility The pt has no GI doctor now She declined a referral

## 2023-04-13 NOTE — Assessment & Plan Note (Signed)
 Check CBC, iron tests

## 2023-04-13 NOTE — Assessment & Plan Note (Addendum)
 Dismotility - chronic n/v - can eat very little S/p capsule endoscopy - remote; slow motility The pt has no GI doctor now She declined a referral

## 2023-04-13 NOTE — Assessment & Plan Note (Signed)
 CFS is worse

## 2023-04-13 NOTE — Progress Notes (Signed)
 Subjective:  Patient ID: Lisa Bruce, female    DOB: [birth year]  Age: 58 y.o. MRN: [id number]  CC: Medical Management of Chronic Issues (Weakness, over sleeping throughout the day and at night.)   HPI Lisa Bruce presents for CFS - worse x 3 months, stiff person syndrome, chronic n/v - can eat very little Here w/Darren  Outpatient Medications Prior to Visit  Medication Sig Dispense Refill   Bacillus Coagulans-Inulin (ALIGN PREBIOTIC-PROBIOTIC PO) Take 1 capsule by mouth.     benzoyl peroxide 10 % gel Apply topically at bedtime.     bisacodyl (DULCOLAX) 5 MG EC tablet Take by mouth.     Cholecalciferol (VITAMIN D3) 125 MCG (5000 UT) TABS Take 1 tablet by mouth.     cyanocobalamin (VITAMIN B12) 1000 MCG/ML injection Inject 1 mL (1,000 mcg total) into the skin every 14 (fourteen) days. 10 mL 6   Efinaconazole (JUBLIA) 10 % SOLN Use qd 8 mL 1   ibuprofen (ADVIL,MOTRIN) 200 MG tablet Take by mouth.     morphine (MS CONTIN) 30 MG 12 hr tablet Take 1 tablet by mouth every 8 (eight) hours as needed.  0   SYRINGE-NEEDLE, DISP, 3 ML (BD ECLIPSE SYRINGE) 25G X 1" 3 ML MISC Use sq for B12 injections 50 each 3   baclofen (LIORESAL) 10 MG tablet Take 1 tablet (10 mg total) by mouth 2 (two) times daily. 180 tablet 3   butalbital-acetaminophen-caffeine (FIORICET) 50-325-40 MG tablet Take 1 tablet by mouth every 6 (six) hours as needed for headache. 60 tablet 4   ciclopirox (PENLAC) 8 % solution Apply topically at bedtime. Apply over nail and surrounding skin. Apply daily over previous coat. After seven (7) days, may remove with alcohol and continue cycle. 6.6 mL 2   diazepam (VALIUM) 10 MG tablet TAKE 3 TABLETS AT 6AM, 3 TABLETS AT NOON, 3 TABLETS AT 5PM, 4 TABLETS AT BEDTIME 390 tablet 4   linaclotide (LINZESS) 145 MCG CAPS capsule Take 1 capsule (145 mcg total) by mouth 1 day or 1 dose. 90 capsule 3   liothyronine (CYTOMEL) 25 MCG tablet Take 0.5 tablets (12.5 mcg total) by mouth daily. 45  tablet 3   ondansetron (ZOFRAN) 8 MG tablet USE AS DIRECTED 30 tablet 1   SYNTHROID 137 MCG tablet Take 1 tablet (137 mcg total) by mouth every morning. 90 tablet 3   No facility-administered medications prior to visit.    ROS: Review of Systems  Constitutional:  Negative for activity change, appetite change, chills, fatigue and unexpected weight change.  HENT:  Negative for congestion, mouth sores and sinus pressure.   Eyes:  Negative for visual disturbance.  Respiratory:  Negative for cough and chest tightness.   Gastrointestinal:  Negative for abdominal pain and nausea.  Genitourinary:  Negative for difficulty urinating, frequency and vaginal pain.  Musculoskeletal:  Positive for back pain, myalgias and neck stiffness. Negative for gait problem.  Skin:  Negative for pallor and rash.  Neurological:  Positive for weakness. Negative for dizziness, tremors, numbness and headaches.  Psychiatric/Behavioral:  Positive for dysphoric mood and sleep disturbance. Negative for confusion and suicidal ideas. The patient is nervous/anxious.     Objective:  BP 122/70   Temp 98 F (36.7 C) (Oral)   Ht 5\' 6"  (1.676 m)   Wt 146 lb (66.2 kg)   LMP  (LMP Unknown)   BMI 23.57 kg/m   BP Readings from Last 3 Encounters:  04/13/23 122/70  07/27/22 110/76  08/04/21  118/80    Wt Readings from Last 3 Encounters:  04/13/23 146 lb (66.2 kg)  07/27/22 151 lb (68.5 kg)  08/04/21 160 lb (72.6 kg)    Physical Exam Constitutional:      General: She is not in acute distress.    Appearance: She is well-developed.  HENT:     Head: Normocephalic.     Right Ear: External ear normal.     Left Ear: External ear normal.     Nose: Nose normal.  Eyes:     General:        Right eye: No discharge.        Left eye: No discharge.     Conjunctiva/sclera: Conjunctivae normal.     Pupils: Pupils are equal, round, and reactive to light.  Neck:     Thyroid: No thyromegaly.     Vascular: No JVD.      Trachea: No tracheal deviation.  Cardiovascular:     Rate and Rhythm: Normal rate and regular rhythm.     Heart sounds: Normal heart sounds.  Pulmonary:     Effort: No respiratory distress.     Breath sounds: No stridor. No wheezing.  Abdominal:     General: Bowel sounds are normal. There is no distension.     Palpations: Abdomen is soft. There is no mass.     Tenderness: There is no abdominal tenderness. There is no guarding or rebound.  Musculoskeletal:        General: Tenderness present.     Cervical back: Normal range of motion and neck supple. No rigidity.     Right lower leg: No edema.     Left lower leg: No edema.  Lymphadenopathy:     Cervical: No cervical adenopathy.  Skin:    Findings: Lesion present. No erythema or rash.  Neurological:     Mental Status: She is oriented to person, place, and time.     Cranial Nerves: No cranial nerve deficit.     Motor: No abnormal muscle tone.     Coordination: Coordination abnormal.     Gait: Gait abnormal.     Deep Tendon Reflexes: Reflexes normal.  Psychiatric:        Behavior: Behavior normal.        Thought Content: Thought content normal.        Judgment: Judgment normal.   hyper spastic extremities  Lab Results  Component Value Date   WBC 4.5 01/25/2023   HGB 13.3 01/25/2023   HCT 40.0 01/25/2023   PLT 201.0 01/25/2023   GLUCOSE 88 01/25/2023   CHOL 194 07/27/2022   TRIG 122.0 07/27/2022   HDL 57.80 07/27/2022   LDLDIRECT 180.1 05/22/2010   LDLCALC 112 (H) 07/27/2022   ALT 29 01/25/2023   AST 39 (H) 01/25/2023   NA 138 01/25/2023   K 3.8 01/25/2023   CL 105 01/25/2023   CREATININE 0.87 01/25/2023   BUN 14 01/25/2023   CO2 23 01/25/2023   TSH 0.01 (L) 01/25/2023   HGBA1C 5.2 01/25/2023    MR Abdomen W Wo Contrast Result Date: 03/10/2023 CLINICAL DATA:  Liver disease, tumor screening, dilated common bile duct status post cholecystectomy EXAM: MRI ABDOMEN WITHOUT AND WITH CONTRAST TECHNIQUE: Multiplanar  multisequence MR imaging of the abdomen was performed both before and after the administration of intravenous contrast. CONTRAST:  6.65mL GADAVIST GADOBUTROL 1 MMOL/ML IV SOLN COMPARISON:  Right upper quadrant ultrasound, 01/29/2023, MR abdomen, 05/14/2015 FINDINGS: Lower chest: No acute abnormality. Hepatobiliary: No focal  liver abnormality is seen. Mildly coarse contour of the liver. Status post cholecystectomy. Postoperative biliary ductal dilatation similar to recent prior ultrasound, common hepatic duct measuring up to 2.0 cm in caliber, increased compared to remote prior examination dated 2017. Duct tapers smoothly to the ampulla without calculus or other obstruction. Pancreas: Unremarkable. No pancreatic ductal dilatation or surrounding inflammatory changes. Spleen: Normal in size without significant abnormality. Adrenals/Urinary Tract: Adrenal glands are unremarkable. Kidneys are normal, without renal calculi, solid lesion, or hydronephrosis. Stomach/Bowel: Stomach is within normal limits. No evidence of bowel wall thickening, distention, or inflammatory changes. Vascular/Lymphatic: No significant vascular findings are present. No enlarged abdominal lymph nodes. Other: No abdominal wall hernia or abnormality. No ascites. Musculoskeletal: No acute or significant osseous findings. IMPRESSION: 1. Status post cholecystectomy. Postoperative biliary ductal dilatation similar to recent prior ultrasound, common hepatic duct measuring up to 2.0 cm in caliber, increased compared to remote prior examination dated 2017. Duct tapers smoothly to the ampulla without calculus or other obstruction. This is almost certainly benign postoperative biliary ductal dilatation, but could be further assessed by ERCP if indicated by clinical or biochemical evidence of biliary obstruction. 2. Mildly coarse contour of the liver, suggesting cirrhosis. Correlate with biochemical findings. 3. No suspicious liver lesions. Electronically  Signed   By: Jearld Lesch M.D.   On: 03/10/2023 17:36    Assessment & Plan:   Problem List Items Addressed This Visit     Hashimoto's thyroiditis - Primary   Cont on  Levothroid, Cytomel      Relevant Medications   liothyronine (CYTOMEL) 25 MCG tablet   SYNTHROID 137 MCG tablet   Other Relevant Orders   CBC with Differential/Platelet   Comprehensive metabolic panel   AFP tumor marker   Iron, TIBC and Ferritin Panel   Zinc   Vitamin B1   Fatigue   CFS is worse      Relevant Orders   CBC with Differential/Platelet   Comprehensive metabolic panel   AFP tumor marker   Iron, TIBC and Ferritin Panel   Zinc   Vitamin B1   Vitamin B6   Vitamin A   ANA positive   [name] saw Dr Barbee Cough ([facility] at [facility])      Hypothyroidism   Check FT4, FT3 and TSH      Relevant Medications   liothyronine (CYTOMEL) 25 MCG tablet   SYNTHROID 137 MCG tablet   Constipation by delayed colonic transit   S/p capsule endoscopy - remote; slow motility The pt has no GI doctor now She declined a referral      Anemia, iron deficiency   Check CBC, iron tests      Relevant Orders   CBC with Differential/Platelet   Comprehensive metabolic panel   AFP tumor marker   Iron, TIBC and Ferritin Panel   Zinc   Vitamin B1   Chronic nausea   Dismotility - chronic n/v - can eat very little S/p capsule endoscopy - remote; slow motility The pt has no GI doctor now She declined a referral      Relevant Orders   CBC with Differential/Platelet   Comprehensive metabolic panel   AFP tumor marker   Iron, TIBC and Ferritin Panel   Zinc   Vitamin B1   Vitamin A   Opioid dependence (HCC)   On opioids - Dr Vear Clock      Positive colorectal cancer screening using Cologuard test   S/p capsule endoscopy - remote; slow motility Abd MRI was ok  Meds ordered this encounter  Medications   baclofen (LIORESAL) 10 MG tablet    Sig: Take 1 tablet (10 mg total) by mouth 2 (two) times daily.     Dispense:  180 tablet    Refill:  3   butalbital-acetaminophen-caffeine (FIORICET) 50-325-40 MG tablet    Sig: Take 1 tablet by mouth every 6 (six) hours as needed for headache.    Dispense:  60 tablet    Refill:  4   ciclopirox (PENLAC) 8 % solution    Sig: Apply topically at bedtime. Apply over nail and surrounding skin. Apply daily over previous coat. After seven (7) days, may remove with alcohol and continue cycle.    Dispense:  6.6 mL    Refill:  2   diazepam (VALIUM) 10 MG tablet    Sig: TAKE 3 TABLETS AT 6AM, 3 TABLETS AT NOON, 3 TABLETS AT 5PM, 4 TABLETS AT BEDTIME    Dispense:  390 tablet    Refill:  4   linaclotide (LINZESS) 145 MCG CAPS capsule    Sig: Take 1 capsule (145 mcg total) by mouth 1 day or 1 dose.    Dispense:  90 capsule    Refill:  3   liothyronine (CYTOMEL) 25 MCG tablet    Sig: Take 0.5 tablets (12.5 mcg total) by mouth daily.    Dispense:  45 tablet    Refill:  3   ondansetron (ZOFRAN) 8 MG tablet    Sig: Take 1 tablet (8 mg total) by mouth See admin instructions.    Dispense:  30 tablet    Refill:  2   SYNTHROID 137 MCG tablet    Sig: Take 1 tablet (137 mcg total) by mouth every morning.    Dispense:  90 tablet    Refill:  3      Follow-up: Return in about 3 months (around 07/11/2023), or 3-4 mo - ok VOV, for a follow-up visit.  Sonda Primes, MD

## 2023-04-13 NOTE — Assessment & Plan Note (Signed)
 On opioids - Dr Vear Clock

## 2023-04-13 NOTE — Assessment & Plan Note (Signed)
 S/p capsule endoscopy - remote; slow motility Abd MRI was ok

## 2023-04-13 NOTE — Assessment & Plan Note (Signed)
 Check FT4, FT3 and TSH

## 2023-05-24 DIAGNOSIS — G894 Chronic pain syndrome: Secondary | ICD-10-CM | POA: Diagnosis not present

## 2023-05-24 DIAGNOSIS — Z79891 Long term (current) use of opiate analgesic: Secondary | ICD-10-CM | POA: Diagnosis not present

## 2023-05-24 DIAGNOSIS — M791 Myalgia, unspecified site: Secondary | ICD-10-CM | POA: Diagnosis not present

## 2023-05-24 DIAGNOSIS — G259 Extrapyramidal and movement disorder, unspecified: Secondary | ICD-10-CM | POA: Diagnosis not present

## 2023-07-26 DIAGNOSIS — M791 Myalgia, unspecified site: Secondary | ICD-10-CM | POA: Diagnosis not present

## 2023-07-26 DIAGNOSIS — G894 Chronic pain syndrome: Secondary | ICD-10-CM | POA: Diagnosis not present

## 2023-07-26 DIAGNOSIS — Z79891 Long term (current) use of opiate analgesic: Secondary | ICD-10-CM | POA: Diagnosis not present

## 2023-07-26 DIAGNOSIS — G259 Extrapyramidal and movement disorder, unspecified: Secondary | ICD-10-CM | POA: Diagnosis not present

## 2023-07-30 ENCOUNTER — Telehealth: Payer: Self-pay

## 2023-07-30 NOTE — Telephone Encounter (Unsigned)
 Copied from CRM [id number]. Topic: General - Other >> Jul 30, 2023  3:23 PM Aisha D wrote: Reason for CRM: Pt would like to have the video visit appt link for 6/19 sent to her as a text message. Pt stated that she does not have MyChart and she would like to have the link sent the day of the appt if possible.

## 2023-08-02 ENCOUNTER — Other Ambulatory Visit: Payer: Self-pay | Admitting: Internal Medicine

## 2023-08-02 ENCOUNTER — Encounter: Payer: Self-pay | Admitting: Internal Medicine

## 2023-08-02 ENCOUNTER — Telehealth (INDEPENDENT_AMBULATORY_CARE_PROVIDER_SITE_OTHER): Admitting: Internal Medicine

## 2023-08-02 DIAGNOSIS — K529 Noninfective gastroenteritis and colitis, unspecified: Secondary | ICD-10-CM | POA: Insufficient documentation

## 2023-08-02 DIAGNOSIS — E538 Deficiency of other specified B group vitamins: Secondary | ICD-10-CM | POA: Diagnosis not present

## 2023-08-02 DIAGNOSIS — R5382 Chronic fatigue, unspecified: Secondary | ICD-10-CM

## 2023-08-02 DIAGNOSIS — G2582 Stiff-man syndrome: Secondary | ICD-10-CM | POA: Diagnosis not present

## 2023-08-02 DIAGNOSIS — E063 Autoimmune thyroiditis: Secondary | ICD-10-CM

## 2023-08-02 DIAGNOSIS — R748 Abnormal levels of other serum enzymes: Secondary | ICD-10-CM | POA: Insufficient documentation

## 2023-08-02 DIAGNOSIS — E038 Other specified hypothyroidism: Secondary | ICD-10-CM

## 2023-08-02 MED ORDER — ONDANSETRON HCL 8 MG PO TABS: 8.0000 mg | ORAL_TABLET | ORAL | 2 refills | Status: DC

## 2023-08-02 MED ORDER — LIOTHYRONINE SODIUM 25 MCG PO TABS: 12.5000 ug | ORAL_TABLET | Freq: Every day | ORAL | 3 refills | Status: DC

## 2023-08-02 MED ORDER — BACLOFEN 10 MG PO TABS: 10.0000 mg | ORAL_TABLET | Freq: Two times a day (BID) | ORAL | 3 refills | Status: DC

## 2023-08-02 MED ORDER — SYNTHROID 137 MCG PO TABS: 137.0000 ug | ORAL_TABLET | Freq: Every morning | ORAL | 3 refills | Status: DC

## 2023-08-02 MED ORDER — BUTALBITAL-APAP-CAFFEINE 50-325-40 MG PO TABS: 1.0000 | ORAL_TABLET | Freq: Four times a day (QID) | ORAL | 4 refills | Status: DC | PRN

## 2023-08-02 MED ORDER — DIAZEPAM 10 MG PO TABS: ORAL_TABLET | ORAL | 4 refills | Status: DC

## 2023-08-02 MED ORDER — LINACLOTIDE 145 MCG PO CAPS: 145.0000 ug | ORAL_CAPSULE | ORAL | 3 refills | Status: AC

## 2023-08-02 NOTE — Assessment & Plan Note (Signed)
No change. On Diazepam, Baclofen  Potential benefits of a long term opioids and benzodiazepine use as well as potential risks (i.e. addiction risk, apnea etc) and complications (i.e. Somnolence, constipation and others) were explained to the patient and were aknowledged.

## 2023-08-02 NOTE — Assessment & Plan Note (Signed)
 Check FT4, FT3 and TSH

## 2023-08-02 NOTE — Progress Notes (Signed)
 Virtual Visit via Video Note  I connected with Lisa Bruce on 08/02/23 at 11:20 AM EDT by a video enabled telemedicine application and verified that I am speaking with the correct person using two identifiers.   I discussed the limitations of evaluation and management by telemedicine and the availability of in person appointments. The patient expressed understanding and agreed to proceed.  I was located at our [facility] office. The patient was at home. There was no one else present in the visit.  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 Negative for fever.  HENT:  Negative for sinus pain.   Respiratory:  Negative for cough.   Cardiovascular:  Positive for leg swelling. Negative for palpitations.  Gastrointestinal:  Positive for nausea and vomiting.  Genitourinary:  Negative for flank pain.  Musculoskeletal:  Positive for back pain, falls and joint pain. Negative for neck pain.  Skin:  Negative for itching.  Neurological:  Positive for dizziness, tremors, weakness and headaches. Negative for loss of consciousness.  Psychiatric/Behavioral:  Positive for depression. Negative for memory loss and suicidal ideas. The patient is nervous/anxious and has insomnia.      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 On Diazepam , Baclofen   Potential benefits of a long term opioids and benzodiazepine use as well as potential risks (i.e. addiction risk, apnea etc) and complications (i.e. Somnolence, constipation and others) were explained to the patient and were aknowledged.      Relevant Medications   baclofen  (LIORESAL ) 10 MG tablet   diazepam  (VALIUM ) 10 MG tablet   Other Relevant Orders   Comprehensive metabolic panel with GFR   CBC with Differential/Platelet   Sedimentation rate   TSH   T4, free   Vitamin B12   Vitamin B12 deficiency   Low levels Start SQ inj 1000 mcg q 2 wks      Relevant Orders   Vitamin B12   Hypothyroidism   Check FT4, FT3 and TSH      Relevant Medications   liothyronine  (CYTOMEL ) 25 MCG tablet   SYNTHROID  137 MCG tablet   Gastroenteritis    N/v/d - she had an attack like she had before (3-4 times) when her LFTs were elevated Check labs including LFTs      Relevant Orders   Comprehensive metabolic panel with GFR   CBC with Differential/Platelet   Sedimentation rate   TSH   T4, free   Vitamin B12   Lipase     Meds ordered this encounter  Medications   baclofen  (LIORESAL ) 10 MG tablet    Sig: Take 1 tablet (10 mg total) by mouth 2 (  two) times daily.    Dispense:  180 tablet    Refill:  3   butalbital -acetaminophen-caffeine  (FIORICET) 50-325-40 MG tablet    Sig: Take 1 tablet by mouth every 6 (six) hours as needed for headache.    Dispense:  60 tablet    Refill:  4   diazepam  (VALIUM ) 10 MG tablet    Sig: TAKE 3 TABLETS AT 6AM, 3 TABLETS AT NOON, 3 TABLETS AT 5PM, 4 TABLETS AT BEDTIME    Dispense:  390 tablet    Refill:  4   linaclotide  (LINZESS ) 145 MCG CAPS capsule    Sig: Take 1 capsule (145 mcg total) by mouth 1 day or 1 dose.    Dispense:  90 capsule    Refill:  3   liothyronine  (CYTOMEL ) 25 MCG tablet    Sig: Take 0.5 tablets (12.5 mcg total) by mouth daily.    Dispense:  45  tablet    Refill:  3   ondansetron  (ZOFRAN ) 8 MG tablet    Sig: Take 1 tablet (8 mg total) by mouth See admin instructions.    Dispense:  30 tablet    Refill:  2   SYNTHROID  137 MCG tablet    Sig: Take 1 tablet (137 mcg total) by mouth every morning.    Dispense:  90 tablet    Refill:  3     Follow Up Instructions:    I discussed the assessment and treatment plan with the patient. The patient was provided an opportunity to ask questions and all were answered. The patient agreed with the plan and demonstrated an understanding of the instructions.   The patient was advised to call back or seek an in-person evaluation if the symptoms worsen or if the condition fails to improve as anticipated.  I provided face-to-face time during this encounter. We were at different locations.   Anitra Barn, MD

## 2023-08-02 NOTE — Assessment & Plan Note (Signed)
 CFS is unchanged

## 2023-08-02 NOTE — Assessment & Plan Note (Signed)
Low levels Start SQ inj 1000 mcg q 2 wks

## 2023-08-02 NOTE — Assessment & Plan Note (Signed)
 N/v/d - she had an attack like she had before (3-4 times) when her LFTs were elevated Check labs including LFTs

## 2023-08-02 NOTE — Telephone Encounter (Signed)
 Copied from CRM [id number]. Topic: General - Other >> Aug 02, 2023  8:30 AM Emmet Harm C wrote: Reason for ZHY:QMVHQIO would like to speak to Dr. Georgia Kipper Nurse regarding that she does not have a mychart so she can join video call a different way than mychart

## 2023-08-02 NOTE — Assessment & Plan Note (Signed)
 Cont on  Levothroid, Cytomel

## 2023-08-09 ENCOUNTER — Other Ambulatory Visit (INDEPENDENT_AMBULATORY_CARE_PROVIDER_SITE_OTHER)

## 2023-08-09 DIAGNOSIS — K529 Noninfective gastroenteritis and colitis, unspecified: Secondary | ICD-10-CM | POA: Diagnosis not present

## 2023-08-09 DIAGNOSIS — E063 Autoimmune thyroiditis: Secondary | ICD-10-CM | POA: Diagnosis not present

## 2023-08-09 DIAGNOSIS — G2582 Stiff-man syndrome: Secondary | ICD-10-CM | POA: Diagnosis not present

## 2023-08-09 DIAGNOSIS — E538 Deficiency of other specified B group vitamins: Secondary | ICD-10-CM

## 2023-08-09 LAB — CBC WITH DIFFERENTIAL/PLATELET
Basophils Absolute: 0 10*3/uL (ref 0.0–0.1)
Basophils Relative: 0.3 % (ref 0.0–3.0)
Eosinophils Absolute: 0 10*3/uL (ref 0.0–0.7)
Eosinophils Relative: 0.6 % (ref 0.0–5.0)
HCT: 40.1 % (ref 36.0–46.0)
Hemoglobin: 13.7 g/dL (ref 12.0–15.0)
Lymphocytes Relative: 50.6 % — ABNORMAL HIGH (ref 12.0–46.0)
Lymphs Abs: 2.5 10*3/uL (ref 0.7–4.0)
MCHC: 34.2 g/dL (ref 30.0–36.0)
MCV: 89.6 fl (ref 78.0–100.0)
Monocytes Absolute: 0.3 10*3/uL (ref 0.1–1.0)
Monocytes Relative: 6.4 % (ref 3.0–12.0)
Neutro Abs: 2.1 10*3/uL (ref 1.4–7.7)
Neutrophils Relative %: 42.1 % — ABNORMAL LOW (ref 43.0–77.0)
Platelets: 177 10*3/uL (ref 150.0–400.0)
RBC: 4.48 Mil/uL (ref 3.87–5.11)
RDW: 14 % (ref 11.5–15.5)
WBC: 5 10*3/uL (ref 4.0–10.5)

## 2023-08-09 LAB — SEDIMENTATION RATE: Sed Rate: 17 mm/h (ref 0–30)

## 2023-08-10 LAB — COMPREHENSIVE METABOLIC PANEL WITH GFR
ALT: 25 U/L (ref 0–35)
AST: 28 U/L (ref 0–37)
Albumin: 4.2 g/dL (ref 3.5–5.2)
Alkaline Phosphatase: 135 U/L — ABNORMAL HIGH (ref 39–117)
BUN: 12 mg/dL (ref 6–23)
CO2: 28 meq/L (ref 19–32)
Calcium: 9.5 mg/dL (ref 8.4–10.5)
Chloride: 104 meq/L (ref 96–112)
Creatinine, Ser: 0.89 mg/dL (ref 0.40–1.20)
GFR: 71.99 mL/min (ref >60.00)
Glucose, Bld: 97 mg/dL (ref 70–99)
Potassium: 3.9 meq/L (ref 3.5–5.1)
Sodium: 139 meq/L (ref 135–145)
Total Bilirubin: 0.4 mg/dL (ref 0.2–1.2)
Total Protein: 7.9 g/dL (ref 6.0–8.3)

## 2023-08-10 LAB — VITAMIN B12: Vitamin B-12: 1354 pg/mL — ABNORMAL HIGH (ref 211–911)

## 2023-08-10 LAB — LIPASE: Lipase: 42 U/L (ref 11.0–59.0)

## 2023-08-10 LAB — TSH: TSH: 0.01 u[IU]/mL — ABNORMAL LOW (ref 0.35–5.50)

## 2023-08-10 LAB — T4, FREE: Free T4: 0.99 ng/dL (ref 0.60–1.60)

## 2023-08-11 ENCOUNTER — Ambulatory Visit: Payer: Self-pay | Admitting: Internal Medicine

## 2023-09-06 NOTE — Telephone Encounter (Signed)
 Copied from CRM [id number]. Topic: Clinical - Lab/Test Results >> Sep 06, 2023  1:01 PM Suzen RAMAN wrote: Reason for CRM: Patient advised of provider notation and requested a call back to further discuss lab result.    CB#[phone]

## 2023-09-20 ENCOUNTER — Telehealth: Payer: Self-pay

## 2023-09-20 NOTE — Telephone Encounter (Signed)
 Copied from CRM [id number]. Topic: Clinical - Lab/Test Results >> Sep 17, 2023  3:51 PM Franky GRADE wrote: Reason for CRM: Patient is calling requesting lab results that she would like her husband to come in and pick up. Lab results from 08/09/2023.

## 2023-09-22 NOTE — Telephone Encounter (Signed)
 Spoke with the pt and was able to inform her that her labs are ready for pick up and have been place up front.

## 2023-09-27 ENCOUNTER — Other Ambulatory Visit (HOSPITAL_COMMUNITY): Payer: Self-pay

## 2023-09-27 DIAGNOSIS — G259 Extrapyramidal and movement disorder, unspecified: Secondary | ICD-10-CM | POA: Diagnosis not present

## 2023-09-27 DIAGNOSIS — G894 Chronic pain syndrome: Secondary | ICD-10-CM | POA: Diagnosis not present

## 2023-09-27 DIAGNOSIS — Z79891 Long term (current) use of opiate analgesic: Secondary | ICD-10-CM | POA: Diagnosis not present

## 2023-09-27 DIAGNOSIS — M791 Myalgia, unspecified site: Secondary | ICD-10-CM | POA: Diagnosis not present

## 2023-09-27 MED ORDER — MORPHINE SULFATE ER 30 MG PO TBCR
30.0000 mg | EXTENDED_RELEASE_TABLET | Freq: Three times a day (TID) | ORAL | 0 refills | Status: AC
  Filled 2023-10-27: qty 90, 30d supply, fill #0

## 2023-09-27 MED ORDER — MORPHINE SULFATE ER 30 MG PO TBCR
30.0000 mg | EXTENDED_RELEASE_TABLET | Freq: Three times a day (TID) | ORAL | 0 refills | Status: AC
  Filled 2023-09-28: qty 90, 30d supply, fill #0

## 2023-09-28 ENCOUNTER — Other Ambulatory Visit (HOSPITAL_COMMUNITY): Payer: Self-pay

## 2023-10-27 ENCOUNTER — Other Ambulatory Visit (HOSPITAL_COMMUNITY): Payer: Self-pay

## 2023-11-01 DIAGNOSIS — K5909 Other constipation: Secondary | ICD-10-CM | POA: Diagnosis not present

## 2023-11-01 DIAGNOSIS — R8289 Other abnormal findings on cytological and histological examination of urine: Secondary | ICD-10-CM | POA: Diagnosis not present

## 2023-11-01 DIAGNOSIS — R531 Weakness: Secondary | ICD-10-CM | POA: Diagnosis not present

## 2023-11-01 DIAGNOSIS — R82998 Other abnormal findings in urine: Secondary | ICD-10-CM | POA: Diagnosis not present

## 2023-11-01 DIAGNOSIS — R10814 Left lower quadrant abdominal tenderness: Secondary | ICD-10-CM | POA: Diagnosis not present

## 2023-11-01 DIAGNOSIS — R112 Nausea with vomiting, unspecified: Secondary | ICD-10-CM | POA: Diagnosis not present

## 2023-11-01 DIAGNOSIS — K838 Other specified diseases of biliary tract: Secondary | ICD-10-CM | POA: Diagnosis not present

## 2023-11-01 DIAGNOSIS — R300 Dysuria: Secondary | ICD-10-CM | POA: Diagnosis not present

## 2023-11-01 DIAGNOSIS — R197 Diarrhea, unspecified: Secondary | ICD-10-CM | POA: Diagnosis not present

## 2023-11-15 ENCOUNTER — Other Ambulatory Visit (HOSPITAL_COMMUNITY): Payer: Self-pay

## 2023-11-15 DIAGNOSIS — G259 Extrapyramidal and movement disorder, unspecified: Secondary | ICD-10-CM | POA: Diagnosis not present

## 2023-11-15 DIAGNOSIS — G894 Chronic pain syndrome: Secondary | ICD-10-CM | POA: Diagnosis not present

## 2023-11-15 DIAGNOSIS — Z79891 Long term (current) use of opiate analgesic: Secondary | ICD-10-CM | POA: Diagnosis not present

## 2023-11-15 DIAGNOSIS — M791 Myalgia, unspecified site: Secondary | ICD-10-CM | POA: Diagnosis not present

## 2023-11-15 MED ORDER — MORPHINE SULFATE ER 30 MG PO TBCR
30.0000 mg | EXTENDED_RELEASE_TABLET | Freq: Three times a day (TID) | ORAL | 0 refills | Status: AC
  Filled 2023-12-27: qty 90, 30d supply, fill #0

## 2023-11-15 MED ORDER — MORPHINE SULFATE ER 30 MG PO TBCR
30.0000 mg | EXTENDED_RELEASE_TABLET | Freq: Three times a day (TID) | ORAL | 0 refills | Status: AC
  Filled 2023-11-25: qty 90, 30d supply, fill #0

## 2023-11-25 ENCOUNTER — Telehealth (INDEPENDENT_AMBULATORY_CARE_PROVIDER_SITE_OTHER): Admitting: Internal Medicine

## 2023-11-25 ENCOUNTER — Other Ambulatory Visit (HOSPITAL_COMMUNITY): Payer: Self-pay

## 2023-11-25 ENCOUNTER — Telehealth: Payer: Self-pay | Admitting: Internal Medicine

## 2023-11-25 ENCOUNTER — Other Ambulatory Visit: Payer: Self-pay | Admitting: Internal Medicine

## 2023-11-25 ENCOUNTER — Encounter: Payer: Self-pay | Admitting: Internal Medicine

## 2023-11-25 VITALS — Ht 66.0 in | Wt 145.0 lb

## 2023-11-25 DIAGNOSIS — G894 Chronic pain syndrome: Secondary | ICD-10-CM | POA: Diagnosis not present

## 2023-11-25 DIAGNOSIS — R5382 Chronic fatigue, unspecified: Secondary | ICD-10-CM

## 2023-11-25 DIAGNOSIS — E038 Other specified hypothyroidism: Secondary | ICD-10-CM

## 2023-11-25 DIAGNOSIS — K5901 Slow transit constipation: Secondary | ICD-10-CM | POA: Diagnosis not present

## 2023-11-25 DIAGNOSIS — M2560 Stiffness of unspecified joint, not elsewhere classified: Secondary | ICD-10-CM

## 2023-11-25 DIAGNOSIS — E538 Deficiency of other specified B group vitamins: Secondary | ICD-10-CM

## 2023-11-25 DIAGNOSIS — N390 Urinary tract infection, site not specified: Secondary | ICD-10-CM

## 2023-11-25 MED ORDER — BACLOFEN 10 MG PO TABS: 10.0000 mg | ORAL_TABLET | Freq: Two times a day (BID) | ORAL | 3 refills | Status: AC

## 2023-11-25 MED ORDER — BUTALBITAL-APAP-CAFFEINE 50-325-40 MG PO TABS: 1.0000 | ORAL_TABLET | Freq: Four times a day (QID) | ORAL | 4 refills | Status: AC | PRN

## 2023-11-25 MED ORDER — LIOTHYRONINE SODIUM 25 MCG PO TABS: 12.5000 ug | ORAL_TABLET | Freq: Every day | ORAL | 3 refills | Status: AC

## 2023-11-25 MED ORDER — SYNTHROID 137 MCG PO TABS: 137.0000 ug | ORAL_TABLET | Freq: Every morning | ORAL | 3 refills | Status: AC

## 2023-11-25 MED ORDER — ONDANSETRON HCL 8 MG PO TABS: 8.0000 mg | ORAL_TABLET | ORAL | 2 refills | Status: DC

## 2023-11-25 MED ORDER — CICLOPIROX 8 % EX SOLN: Freq: Every day | CUTANEOUS | 2 refills | Status: AC

## 2023-11-25 MED ORDER — DIAZEPAM 10 MG PO TABS: ORAL_TABLET | ORAL | 4 refills | Status: DC

## 2023-11-25 NOTE — Assessment & Plan Note (Signed)
 CFS is unchanged

## 2023-11-25 NOTE — Assessment & Plan Note (Signed)
 S/p capsule endoscopy - remote; slow motility GI ref if needed

## 2023-11-25 NOTE — Progress Notes (Signed)
 Virtual Visit via Video Note  I connected with Lisa Bruce on 11/25/23 at  8:10 AM EDT by a video enabled telemedicine application and verified that I am speaking with the correct person using two identifiers.   I discussed the limitations of evaluation and management by telemedicine and the availability of in person appointments. The patient expressed understanding and agreed to proceed.  I was located at our [facility] office. The patient was at home. Darren was present in the visit.  Chief Complaint  Patient presents with   Follow-up    Patient states nothing to discuss just wants to do quick follow up and tell about an ER visit and refill medications.      History of Present Illness:   HPI JAYLEENE GLAESER presents for chronic stiffness, HTN, chronic pain, HAs  [name] had a UTI in Sept - treated w/Keflex:  Elois Averitt is a 58 y.o. female with past medical history as below who presents with dysuria. Per patient and daughter at bedside, she's experienced 1 week of nausea, vomiting, and diarrhea that's worsened in the past 2-3 days. She endorses a history of chronic constipation and notes that her stools have been loose relative to her baseline. Her daughter states that the patient has a history of long-standing GI issues of which she's never received a true or extensive workup for, partially due to anxiety. She also notes that she has difficulty tolerating PO contrast. The patient states that 5 days ago while she was urinating, she noticed that her urine was malodorous such that it smelled like feces or gas. She reports that the color of her urine was dark and cloudy with feculent matter mixed in. Her daughter states that 3-4 days ago she developed dysuria. Of note, the patient takes daily narcotics 2/2 her chronic pain. Her daughter notes that the patient recently had an elevated alkaline phosphate level along with proteinuria. She denies any recent antibiotic use. They  deny a history of C. Diff, cardiac disease, or renal disease. The patient denies fever, cough, congestion, hematuria, hematemesis, or new leg swelling.   Quitman Cain, MD - 11/01/2023  Formatting of this note might be different from the original. EXAM: CT ABDOMEN PELVIS W CONTRAST ACCESSION: [id number] UN REPORT DATE: 11/01/2023 4:12 PM CLINICAL INDICATION: 58 years old with patient describing chronic constipation with new feculent foul smelling urine  COMPARISON: None  TECHNIQUE: CT scan of the abdomen and pelvis was obtained following IV contrast from the lung bases through the pubic symphysis. Images were reconstructed in the axial plane. Coronal and sagittal reformatted images were also provided for further evaluation.   FINDINGS:  LOWER CHEST: Bibasilar scarring versus atelectasis.  LIVER: Normal liver contour. No focal liver lesions.  BILIARY: The gallbladder is surgically absent. Prominent intra and extrahepatic biliary ductal dilatation, likely secondary to status post cholecystectomy state. The cyst duct also appears dilated. The common bile duct measures 1.3 cm.  SPLEEN: Normal in size and contour.  PANCREAS: Normal pancreatic contour. No focal lesions. No ductal dilation.  ADRENAL GLANDS: Normal appearance of the adrenal glands.  KIDNEYS/URETERS: Symmetric renal enhancement. No hydronephrosis. No solid renal mass.  BLADDER: There is mild circumferential urinary bladder wall thickening and enhancement.  REPRODUCTIVE ORGANS: No pelvic mass or collection  GI TRACT: No findings of bowel obstruction or acute inflammation. Normal appendix. Moderate colonic stool volume.  PERITONEUM, RETROPERITONEUM AND MESENTERY: No free air. No ascites. No fluid collection.  LYMPH NODES: No adenopathy.  VESSELS: Hepatic  and portal veins are patent. Normal caliber aorta.  BONES and SOFT TISSUES: Levoconvex curvature of the lumbar spine. Multilevel degenerative changes of the  visualized spine. Small fat-containing umbilical hernia.   IMPRESSION: 13 mm common bile duct and mild intraductal biliary ductal dilatation. This can be within normal limits status post cholecystectomy, provided that there are no biliary laboratory abnormalities.  Findings as can be seen with cystitis; correlation with urinalysis is advised.    Additional chronic and incidental findings as detailed in the body of the report. I, Adine Caraway, MD, have personally reviewed the images and concur with the resident preliminary report above. This report now represents the final report for this patient.  ROS   Observations/Objective: The patient appears to be in no acute distress  Assessment and Plan:  Problem List Items Addressed This Visit     Chronic pain syndrome - Primary   F/u w/Dr Orlando On MS contin       Relevant Medications   baclofen  (LIORESAL ) 10 MG tablet   butalbital -acetaminophen-caffeine  (FIORICET) 50-325-40 MG tablet   Constipation by delayed colonic transit   S/p capsule endoscopy - remote; slow motility GI ref if needed      Fatigue   CFS is unchanged      Hypothyroidism   Chronic  Cont on Levothroid, Cytomel  combo helps the pt the best Check FT4, FT3 and TSH      Relevant Medications   liothyronine  (CYTOMEL ) 25 MCG tablet   SYNTHROID  137 MCG tablet   Stiffness in joint   Baclofen , diazepam  as needed  Potential benefits of a long term benzodiazepines  use as well as potential risks  and complications were explained to the patient and were aknowledged.       UTI (urinary tract infection)    UTI in Sept - treated w/Keflex at [facility] Pt had a CT      Relevant Medications   ciclopirox  (PENLAC ) 8 % solution   Vitamin B12 deficiency   On B12        Meds ordered this encounter  Medications   baclofen  (LIORESAL ) 10 MG tablet    Sig: Take 1 tablet (10 mg total) by mouth 2 (two) times daily.    Dispense:  180 tablet    Refill:  3    butalbital -acetaminophen-caffeine  (FIORICET) 50-325-40 MG tablet    Sig: Take 1 tablet by mouth every 6 (six) hours as needed for headache.    Dispense:  60 tablet    Refill:  4   ciclopirox  (PENLAC ) 8 % solution    Sig: Apply topically at bedtime. Apply over nail and surrounding skin. Apply daily over previous coat. After seven (7) days, may remove with alcohol and continue cycle.    Dispense:  6.6 mL    Refill:  2   diazepam  (VALIUM ) 10 MG tablet    Sig: TAKE 3 TABLETS AT 6AM, 3 TABLETS AT NOON, 3 TABLETS AT 5PM, 4 TABLETS AT BEDTIME    Dispense:  390 tablet    Refill:  4   liothyronine  (CYTOMEL ) 25 MCG tablet    Sig: Take 0.5 tablets (12.5 mcg total) by mouth daily.    Dispense:  45 tablet    Refill:  3   ondansetron  (ZOFRAN ) 8 MG tablet    Sig: Take 1 tablet (8 mg total) by mouth See admin instructions.    Dispense:  30 tablet    Refill:  2   SYNTHROID  137 MCG tablet    Sig: Take  1 tablet (137 mcg total) by mouth every morning.    Dispense:  90 tablet    Refill:  3     Follow Up Instructions:    I discussed the assessment and treatment plan with the patient. The patient was provided an opportunity to ask questions and all were answered. The patient agreed with the plan and demonstrated an understanding of the instructions.   The patient was advised to call back or seek an in-person evaluation if the symptoms worsen or if the condition fails to improve as anticipated.  I provided face-to-face time during this encounter. We were at different locations.   Marolyn Noel, MD

## 2023-11-25 NOTE — Assessment & Plan Note (Signed)
 On B12

## 2023-11-25 NOTE — Assessment & Plan Note (Signed)
 F/u w/Dr Orlando On MS contin 

## 2023-11-25 NOTE — Telephone Encounter (Signed)
 Copied from CRM #[id number]. Topic: Clinical - Prescription Issue >> Nov 25, 2023  9:03 AM Suzette B wrote: Reason for CRM: [facility] [phone] diazepam  (VALIUM ) 10 MG tablet, pharmacist has called expressing concerns about the amount of valium  the patient has been prescribed she states the prescription has been sent over w/o a diagnosis code and she will not fill

## 2023-11-25 NOTE — Assessment & Plan Note (Signed)
 UTI in Sept - treated w/Keflex at [facility] Pt had a CT

## 2023-11-25 NOTE — Assessment & Plan Note (Signed)
Baclofen, diazepam as needed  Potential benefits of a long term benzodiazepines  use as well as potential risks  and complications were explained to the patient and were aknowledged.

## 2023-11-25 NOTE — Assessment & Plan Note (Signed)
 Chronic  Cont on Levothroid, Cytomel  combo helps the pt the best Check FT4, FT3 and TSH

## 2023-11-26 MED ORDER — DIAZEPAM 10 MG PO TABS: ORAL_TABLET | ORAL | 4 refills | Status: AC

## 2023-11-26 NOTE — Telephone Encounter (Signed)
 On Valium  chronically for her dx of  stiff man syndrome -stable on current dose.  A new prescription was sent in Thanks

## 2023-11-26 NOTE — Addendum Note (Signed)
 Addended by: Teran Daughenbaugh V on: 11/26/2023 09:43 AM   Modules accepted: Orders

## 2023-12-27 ENCOUNTER — Other Ambulatory Visit (HOSPITAL_COMMUNITY): Payer: Self-pay

## 2024-01-11 ENCOUNTER — Other Ambulatory Visit: Payer: Self-pay

## 2024-01-11 ENCOUNTER — Telehealth: Payer: Self-pay

## 2024-01-11 DIAGNOSIS — R112 Nausea with vomiting, unspecified: Secondary | ICD-10-CM

## 2024-01-11 MED ORDER — ONDANSETRON HCL 8 MG PO TABS: 8.0000 mg | ORAL_TABLET | Freq: Three times a day (TID) | ORAL | 3 refills | Status: DC | PRN

## 2024-01-11 NOTE — Telephone Encounter (Signed)
 Copied from CRM [id number]. Topic: Clinical - Prescription Issue >> Jan 11, 2024 12:04 PM Burnard DEL wrote: Reason for CRM: Patient called in stating that the directions for ondansetron  (ZOFRAN ) 8 MG tablet is incorrect. She stated that she has already been prescribed to take 1 pill every 8 hours. The instructions have changed,which Is causing her to run out early.She would like to know if a new prescription could be sent in   reflecting her taking 1 tablet every 8 hours as needed for nausea and vomiting?so that she doesn't run out? Patient would like to know if she could have this prescription today because she is leaving to go out of town in the morning.   [id number] - [place],  - [address]  Phone: [phone] Fax: [phone]

## 2024-01-11 NOTE — Telephone Encounter (Signed)
 I was able to update and send in new rx to pts pharmacy as quested with the quant updated as well.

## 2024-01-11 NOTE — Telephone Encounter (Signed)
 I have spoken to the pt and was able to inform her of update and rx refill being sent into her pharmacy.

## 2024-01-17 ENCOUNTER — Other Ambulatory Visit (HOSPITAL_COMMUNITY): Payer: Self-pay

## 2024-01-17 DIAGNOSIS — M791 Myalgia, unspecified site: Secondary | ICD-10-CM | POA: Diagnosis not present

## 2024-01-17 DIAGNOSIS — Z79891 Long term (current) use of opiate analgesic: Secondary | ICD-10-CM | POA: Diagnosis not present

## 2024-01-17 DIAGNOSIS — G259 Extrapyramidal and movement disorder, unspecified: Secondary | ICD-10-CM | POA: Diagnosis not present

## 2024-01-17 DIAGNOSIS — G894 Chronic pain syndrome: Secondary | ICD-10-CM | POA: Diagnosis not present

## 2024-01-17 MED ORDER — MORPHINE SULFATE ER 30 MG PO TBCR
30.0000 mg | EXTENDED_RELEASE_TABLET | Freq: Three times a day (TID) | ORAL | 0 refills | Status: AC
  Filled 2024-01-25: qty 90, 30d supply, fill #0

## 2024-01-17 MED ORDER — MORPHINE SULFATE ER 30 MG PO TBCR
30.0000 mg | EXTENDED_RELEASE_TABLET | Freq: Three times a day (TID) | ORAL | 0 refills | Status: AC
  Filled 2024-02-23: qty 90, 30d supply, fill #0

## 2024-01-18 ENCOUNTER — Other Ambulatory Visit (HOSPITAL_COMMUNITY): Payer: Self-pay

## 2024-01-25 ENCOUNTER — Other Ambulatory Visit (HOSPITAL_COMMUNITY): Payer: Self-pay

## 2024-02-20 ENCOUNTER — Other Ambulatory Visit: Payer: Self-pay | Admitting: Internal Medicine

## 2024-02-20 DIAGNOSIS — R112 Nausea with vomiting, unspecified: Secondary | ICD-10-CM

## 2024-02-23 ENCOUNTER — Other Ambulatory Visit (HOSPITAL_COMMUNITY): Payer: Self-pay

## 2024-03-13 ENCOUNTER — Ambulatory Visit: Admitting: Internal Medicine

## 2024-03-22 ENCOUNTER — Other Ambulatory Visit (HOSPITAL_COMMUNITY): Payer: Self-pay

## 2024-03-22 MED ORDER — MORPHINE SULFATE ER 30 MG PO TBCR
30.0000 mg | EXTENDED_RELEASE_TABLET | Freq: Three times a day (TID) | ORAL | 0 refills | Status: AC
  Filled 2024-03-22: qty 90, 30d supply, fill #0

## 2024-03-22 MED ORDER — MORPHINE SULFATE ER 30 MG PO TBCR: 30.0000 mg | EXTENDED_RELEASE_TABLET | Freq: Three times a day (TID) | ORAL | 0 refills | Status: AC
